# Patient Record
Sex: Female | Born: 1990
Health system: Southern US, Community
[De-identification: ages and names within clinical notes are randomized; demographics above are authoritative.]

## PROBLEM LIST (undated history)

## (undated) DIAGNOSIS — F329 Major depressive disorder, single episode, unspecified: Secondary | ICD-10-CM

## (undated) DIAGNOSIS — G43909 Migraine, unspecified, not intractable, without status migrainosus: Secondary | ICD-10-CM

## (undated) DIAGNOSIS — N819 Female genital prolapse, unspecified: Secondary | ICD-10-CM

## (undated) DIAGNOSIS — N393 Stress incontinence (female) (male): Secondary | ICD-10-CM

## (undated) DIAGNOSIS — G43109 Migraine with aura, not intractable, without status migrainosus: Secondary | ICD-10-CM

## (undated) DIAGNOSIS — O4290 Premature rupture of membranes, unspecified as to length of time between rupture and onset of labor, unspecified weeks of gestation: Secondary | ICD-10-CM

## (undated) DIAGNOSIS — Z3400 Encounter for supervision of normal first pregnancy, unspecified trimester: Secondary | ICD-10-CM

## (undated) DIAGNOSIS — D649 Anemia, unspecified: Secondary | ICD-10-CM

## (undated) DIAGNOSIS — Z973 Presence of spectacles and contact lenses: Secondary | ICD-10-CM

## (undated) DIAGNOSIS — F419 Anxiety disorder, unspecified: Secondary | ICD-10-CM

## (undated) HISTORY — DX: Encounter for supervision of normal first pregnancy, unspecified trimester: Z34.00

## (undated) HISTORY — PX: ABDOMINAL HYSTERECTOMY: SHX81

## (undated) HISTORY — DX: Premature rupture of membranes, unspecified as to length of time between rupture and onset of labor, unspecified weeks of gestation: O42.90

## (undated) HISTORY — PX: NO PAST SURGERIES: SHX2092

---
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
## 2011-02-06 ENCOUNTER — Emergency Department (HOSPITAL_COMMUNITY)
Admission: EM | Admit: 2011-02-06 | Discharge: 2011-02-06 | Disposition: A | Discharge status: Home / Self Care | Payer: BC Managed Care – PPO | Attending: Emergency Medicine | Admitting: Emergency Medicine

## 2011-02-06 DIAGNOSIS — R110 Nausea: Secondary | ICD-10-CM | POA: Insufficient documentation

## 2011-02-06 DIAGNOSIS — H53149 Visual discomfort, unspecified: Secondary | ICD-10-CM | POA: Insufficient documentation

## 2011-02-06 DIAGNOSIS — G43909 Migraine, unspecified, not intractable, without status migrainosus: Secondary | ICD-10-CM | POA: Insufficient documentation

## 2011-11-12 DIAGNOSIS — E559 Vitamin D deficiency, unspecified: Secondary | ICD-10-CM | POA: Insufficient documentation

## 2011-11-12 DIAGNOSIS — D519 Vitamin B12 deficiency anemia, unspecified: Secondary | ICD-10-CM | POA: Insufficient documentation

## 2015-08-20 ENCOUNTER — Encounter: Payer: Self-pay | Admitting: Emergency Medicine

## 2015-08-20 ENCOUNTER — Emergency Department
Admission: EM | Admit: 2015-08-20 | Discharge: 2015-08-20 | Disposition: A | Discharge status: Home / Self Care | Payer: 59 | Source: Home / Self Care | Attending: Family Medicine | Admitting: Family Medicine

## 2015-08-20 DIAGNOSIS — J028 Acute pharyngitis due to other specified organisms: Secondary | ICD-10-CM | POA: Diagnosis not present

## 2015-08-20 DIAGNOSIS — J069 Acute upper respiratory infection, unspecified: Secondary | ICD-10-CM

## 2015-08-20 DIAGNOSIS — B9789 Other viral agents as the cause of diseases classified elsewhere: Secondary | ICD-10-CM

## 2015-08-20 HISTORY — DX: Migraine, unspecified, not intractable, without status migrainosus: G43.909

## 2015-08-20 LAB — POCT RAPID STREP A (OFFICE): Rapid Strep A Screen: NEGATIVE

## 2015-08-20 MED ORDER — BENZONATATE 200 MG PO CAPS: 200.0000 mg | ORAL_CAPSULE | Freq: Every day | ORAL | Status: DC

## 2015-08-20 NOTE — ED Notes (Signed)
Sore throat, ears hurt x 1.5 days, patient says she's a hypochondriac

## 2015-08-20 NOTE — Discharge Instructions (Signed)
Take plain guaifenesin (1200mg extended release tabs such as Mucinex) twice daily, with plenty of water, for cough and congestion.  May add Pseudoephedrine (30mg, one or two every 4 to 6 hours) for sinus congestion.  Get adequate rest.   °May use Afrin nasal spray (or generic oxymetazoline) twice daily for about 5 days and then discontinue.  Also recommend using saline nasal spray several times daily and saline nasal irrigation (AYR is a common brand).  Use Flonase nasal spray each morning after using Afrin nasal spray and saline nasal irrigation. °Try warm salt water gargles for sore throat.  °Stop all antihistamines for now, and other non-prescription cough/cold preparations. °May take Ibuprofen 200mg, 4 tabs every 8 hours with food for chest/sternum discomfort. °  °Follow-up with family doctor if not improving about10 days.  °

## 2015-08-20 NOTE — ED Provider Notes (Signed)
CSN: [id number]     Arrival date & time 08/20/15  0935 History   First MD Initiated Contact with Patient 08/20/15 1021     Chief Complaint  Patient presents with  . Sore Throat      HPI Comments: Patient complains of three day history of typical cold-like symptoms developing over several days,  including mild sore throat, fever/chills, sinus congestion, fatigue, and cough.   The history is provided by the patient and a parent.    Past Medical History  Diagnosis Date  . Migraines    History reviewed. No pertinent past surgical history. Family History  Problem Relation Age of Onset  . Hypertension Father   . Hyperlipidemia Father    Social History  Substance Use Topics  . Smoking status: Never Smoker   . Smokeless tobacco: None  . Alcohol Use: No   OB History    No data available     Review of Systems + sore throat + cough No pleuritic pain No wheezing + nasal congestion + post-nasal drainage No sinus pain/pressure No itchy/red eyes ? earache No hemoptysis No SOB + fever, + chills No nausea No vomiting No abdominal pain No diarrhea No urinary symptoms No skin rash + fatigue + myalgias No headache Used OTC meds without relief  Allergies  Latex  Home Medications   Prior to Admission medications   Medication Sig Start Date End Date Taking? Authorizing Provider  benzonatate (TESSALON) 200 MG capsule Take 1 capsule (200 mg total) by mouth at bedtime. Take as needed for cough 08/20/15   Lattie HawStephen A Beese, MD   Meds Ordered and Administered this Visit  Medications - No data to display  BP 110/76 mmHg  Pulse 102  Temp(Src) 100.6 F (38.1 C) (Oral)  Ht 4\' 10"  (1.473 m)  Wt 111 lb (50.349 kg)  BMI 23.21 kg/m2  SpO2 100%  LMP 08/12/2015 (Exact Date) No data found.   Physical Exam Nursing notes and Vital Signs reviewed. Appearance:  Patient appears stated age, and in no acute distress Eyes:  Pupils are equal, round, and reactive to light and  accomodation.  Extraocular movement is intact.  Conjunctivae are not inflamed  Ears:  Canals normal.  Tympanic membranes normal.  Nose:  Mildly congested turbinates.  No sinus tenderness.   Pharynx:  Minimal erythema Neck:  Supple.  Tender enlarged posterior/lateral nodes are palpated bilaterally  Lungs:  Clear to auscultation.  Breath sounds are equal.  Moving air well. Heart:  Regular rate and rhythm without murmurs, rubs, or gallops.  Abdomen:  Nontender without masses or hepatosplenomegaly.  Bowel sounds are present.  No CVA or flank tenderness.  Extremities:  No edema.  Skin:  No rash present.   ED Course  Procedures none    Labs Reviewed  POCT RAPID STREP A (OFFICE)      MDM   1. Acute pharyngitis due to other specified organisms   2. Viral URI with cough    There is no evidence of bacterial infection today.   Treat symptomatically for now  Prescription written for Benzonatate (Tessalon) to take at bedtime for night-time cough.  Take plain guaifenesin (1200mg  extended release tabs such as Mucinex) twice daily, with plenty of water, for cough and congestion.  May add Pseudoephedrine (30mg , one or two every 4 to 6 hours) for sinus congestion.  Get adequate rest.   May use Afrin nasal spray (or generic oxymetazoline) twice daily for about 5 days and then discontinue.  Also recommend  using saline nasal spray several times daily and saline nasal irrigation (AYR is a common brand).  Use Flonase nasal spray each morning after using Afrin nasal spray and saline nasal irrigation. Try warm salt water gargles for sore throat.  Stop all antihistamines for now, and other non-prescription cough/cold preparations. May take Ibuprofen , 4 tabs every 8 hours with food for chest/sternum discomfort.   Follow-up with family doctor if not improving about10 days.     Lattie Haw, MD 08/20/15 1040

## 2015-08-22 ENCOUNTER — Telehealth: Payer: Self-pay | Admitting: *Deleted

## 2015-08-26 ENCOUNTER — Encounter: Payer: Self-pay | Admitting: Physician Assistant

## 2015-08-26 ENCOUNTER — Ambulatory Visit (INDEPENDENT_AMBULATORY_CARE_PROVIDER_SITE_OTHER): Payer: 59 | Admitting: Physician Assistant

## 2015-08-26 VITALS — BP 124/73 | HR 97 | Temp 98.7°F | Ht <= 58 in | Wt 108.0 lb

## 2015-08-26 DIAGNOSIS — H10023 Other mucopurulent conjunctivitis, bilateral: Secondary | ICD-10-CM

## 2015-08-26 DIAGNOSIS — H6502 Acute serous otitis media, left ear: Secondary | ICD-10-CM

## 2015-08-26 MED ORDER — OFLOXACIN 0.3 % OP SOLN: 1.0000 [drp] | Freq: Four times a day (QID) | OPHTHALMIC | Status: DC

## 2015-08-26 MED ORDER — AMOXICILLIN-POT CLAVULANATE 875-125 MG PO TABS: 1.0000 | ORAL_TABLET | Freq: Two times a day (BID) | ORAL | Status: DC

## 2015-08-26 NOTE — Patient Instructions (Signed)

## 2015-08-26 NOTE — Progress Notes (Signed)
   Subjective:    Patient ID: Jordan [name], female    DOB: [birth year], 25 y.o.   MRN: [id number]  HPI Pt is a 25 yo female who presents to the clinic to establish care.   No PMH.  . Family History  Problem Relation Age of Onset  . Hypertension Father   . Hyperlipidemia Father   . Cancer Maternal Grandmother   . Cancer Maternal Grandfather    .Marland Kitchen. Social History   Social History  . Marital Status: Single    Spouse Name: N/A  . Number of Children: N/A  . Years of Education: N/A   Occupational History  . Not on file.   Social History Main Topics  . Smoking status: Never Smoker   . Smokeless tobacco: Not on file  . Alcohol Use: No  . Drug Use: No  . Sexual Activity: No   Other Topics Concern  . Not on file   Social History Narrative   Pt comes in with 10 days of cough, sinus pressure, new left ear pain, bilateral painful red eyes with green discharge. Seen in Urgent Care for URI and given tessalon pearls and mucinex. She has not gotten any better and her eye irritation/discomfort and discharge started after. No fever, chills, body aches, no SOB or wheezing.    Review of Systems See HPI.     Objective:   Physical Exam  Constitutional: She is oriented to person, place, and time. She appears well-developed and well-nourished.  HENT:  Head: Normocephalic and atraumatic.  Right Ear: External ear normal.  Left Ear: External ear normal.  Mouth/Throat: Oropharynx is clear and moist. No oropharyngeal exudate.  Left TM no visible light reflex. Erythematous. Dull TM.  Tenderness over maxillary sinus, bilateral.  Bilateral nasal turbinates red and swollen.   Eyes:  Bilateral injected conjunctiva. No discharge present on exam today. Pt reported just washed out.   Neck: Normal range of motion. Neck supple.  Cardiovascular: Normal rate, regular rhythm and normal heart sounds.   Pulmonary/Chest: Effort normal and breath sounds normal. She has no wheezes.   Lymphadenopathy:    She has cervical adenopathy.  Neurological: She is alert and oriented to person, place, and time.  Psychiatric: She has a normal mood and affect. Her behavior is normal.          Assessment & Plan:  Bilateral bacterial conjunctivitis/left otitis media- augmentin for 10 days. Ofloxacin for 7 days. HO given. Cool compresses. Stay out of work for 24 hours. Note given for this weekend. Follow up if not improving.

## 2015-12-23 ENCOUNTER — Encounter (INDEPENDENT_AMBULATORY_CARE_PROVIDER_SITE_OTHER): Payer: Self-pay

## 2015-12-23 ENCOUNTER — Ambulatory Visit (INDEPENDENT_AMBULATORY_CARE_PROVIDER_SITE_OTHER): Payer: 59 | Admitting: Obstetrics & Gynecology

## 2015-12-23 ENCOUNTER — Encounter: Payer: Self-pay | Admitting: Obstetrics & Gynecology

## 2015-12-23 VITALS — BP 117/80 | HR 90 | Ht <= 58 in | Wt 108.0 lb

## 2015-12-23 DIAGNOSIS — Z30015 Encounter for initial prescription of vaginal ring hormonal contraceptive: Secondary | ICD-10-CM | POA: Diagnosis not present

## 2015-12-23 DIAGNOSIS — Z124 Encounter for screening for malignant neoplasm of cervix: Secondary | ICD-10-CM

## 2015-12-23 DIAGNOSIS — Z01419 Encounter for gynecological examination (general) (routine) without abnormal findings: Secondary | ICD-10-CM

## 2015-12-23 MED ORDER — ETONOGESTREL-ETHINYL ESTRADIOL 0.12-0.015 MG/24HR VA RING: VAGINAL_RING | VAGINAL | 12 refills | Status: DC

## 2015-12-23 NOTE — Progress Notes (Signed)
Subjective:    Jordan [name] is a 25 y.o. S W P0  female who presents for an annual exam, new patient. She has irregular periods, sometimes skips, cycles sometimes very heavy, anywhere from 3-10 days long. She tried OCPs in the past but KNOWS she won't remember to take them. The patient has never been sexually active. GYN screening history: no prior history of gyn screening tests. The patient wears seatbelts: yes. The patient participates in regular exercise: yes. Has the patient ever been transfused or tattooed?: yes. The patient reports that there is not domestic violence in her life.   Menstrual History: OB History    Gravida Para Term Preterm AB Living   0 0 0 0 0 0   SAB TAB Ectopic Multiple Live Births   0 0 0 0 0      Menarche age: [age] Patient's last menstrual period was 12/13/2015 (exact date).    The following portions of the patient's history were reviewed and updated as appropriate: allergies, current medications, past family history, past medical history, past social history, past surgical history and problem list.  Review of Systems Pertinent items are noted in HPI.   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 No breast/gyn/colon cancer   Objective:    BP 117/80   Pulse 90   Ht 4\' 10"  (1.473 m)   Wt 108 lb (49 kg)   LMP 12/13/2015 (Exact Date) Comment: Irregular periods  BMI 22.57 kg/m   General Appearance:    Alert, cooperative, no distress, appears stated age  Head:    Normocephalic, without obvious abnormality, atraumatic  Eyes:    PERRL, conjunctiva/corneas clear, EOM's intact, fundi    benign, both eyes  Ears:    Normal TM's and external ear canals, both ears  Nose:   Nares normal, septum midline, mucosa normal, no drainage    or sinus tenderness  Throat:   Lips, mucosa, and tongue normal; teeth and gums normal  Neck:   Supple, symmetrical, trachea midline, no adenopathy;    thyroid:  no enlargement/tenderness/nodules; no carotid   bruit or JVD  Back:      Symmetric, no curvature, ROM normal, no CVA tenderness  Lungs:     Clear to auscultation bilaterally, respirations unlabored  Chest Wall:    No tenderness or deformity   Heart:    Regular rate and rhythm, S1 and S2 normal, no murmur, rub   or gallop  Breast Exam:    No tenderness, masses, or nipple abnormality  Abdomen:     Soft, non-tender, bowel sounds active all four quadrants,    no masses, no organomegaly  Genitalia:    Normal female without lesion, discharge or tenderness, pediatric speculum required, NSSA, NT, no palpable adnexal masses, Nuvaring placed     Extremities:   Extremities normal, atraumatic, no cyanosis or edema  Pulses:   2+ and symmetric all extremities  Skin:   Skin color, texture, turgor normal, no rashes or lesions  Lymph nodes:   Cervical, supraclavicular, and axillary nodes normal  Neurologic:   CNII-XII intact, normal strength, sensation and reflexes    throughout   .    Assessment:    Healthy female exam.   Irregular periods   Plan:     Thin prep Pap smear.   Start Nuvaring (She declines ortho evra/IUD) Check tsh, vitamin D

## 2015-12-23 NOTE — Progress Notes (Signed)
Pt declined flu shot. Pt stated she will get it at her job site

## 2015-12-24 ENCOUNTER — Telehealth: Payer: Self-pay | Admitting: *Deleted

## 2015-12-24 DIAGNOSIS — E559 Vitamin D deficiency, unspecified: Secondary | ICD-10-CM

## 2015-12-24 LAB — VITAMIN D 25 HYDROXY (VIT D DEFICIENCY, FRACTURES): VIT D 25 HYDROXY: 13 ng/mL — AB (ref 30–100)

## 2015-12-24 LAB — TSH: TSH: 3.71 m[IU]/L

## 2015-12-24 MED ORDER — VITAMIN D (ERGOCALCIFEROL) 1.25 MG (50000 UNIT) PO CAPS: 50000.0000 [IU] | ORAL_CAPSULE | ORAL | 0 refills | Status: DC

## 2015-12-24 NOTE — Telephone Encounter (Signed)
Pt notified of abnormal Vitamin D levels.  RX for Vitamin D 50K for 8 weeks sent to [facility].  Pt to return in 8 weeks to repeat labwork.  TSH was normal

## 2015-12-25 LAB — CYTOLOGY - PAP

## 2016-10-08 ENCOUNTER — Encounter: Payer: Self-pay | Admitting: Osteopathic Medicine

## 2016-10-08 ENCOUNTER — Ambulatory Visit (INDEPENDENT_AMBULATORY_CARE_PROVIDER_SITE_OTHER): Payer: 59 | Admitting: Osteopathic Medicine

## 2016-10-08 VITALS — BP 112/79 | HR 89 | Ht <= 58 in | Wt 103.5 lb

## 2016-10-08 DIAGNOSIS — Z8669 Personal history of other diseases of the nervous system and sense organs: Secondary | ICD-10-CM | POA: Insufficient documentation

## 2016-10-08 DIAGNOSIS — G43B0 Ophthalmoplegic migraine, not intractable: Secondary | ICD-10-CM | POA: Diagnosis not present

## 2016-10-08 MED ORDER — SUMATRIPTAN SUCCINATE 50 MG PO TABS: 50.0000 mg | ORAL_TABLET | ORAL | 1 refills | Status: DC | PRN

## 2016-10-08 NOTE — Patient Instructions (Signed)
Plan:  Imitrex as needed at the first sign of migraine Neurology referral is ordered - let us know if you don't hear back about an appointment with them within one week If worsening or changing symptoms, please seek medical care asap!

## 2016-10-08 NOTE — Progress Notes (Signed)
HPI: Jordan [name] is a 26 y.o. female  who presents to [facility] today, 10/08/16,  for chief complaint of:  Chief Complaint  Patient presents with  . Referral    Headaches: previously diagnosed with migraines and [facility], was put on propranolol but this wasn't helping and she states she was not really offered much else in the way of therapy. Can recall if previously on abortive treatment such as Imitrex. Describes headaches as starting in frontal area then worsening into left side, occasionally associated with left-sided blindness/severe blurry vision and diminished hearing. Does not have the symptoms in the absence of headache. Can last for a few hours or so and then go away. Happening about twice per month whereas used to be a bit more rare. Occasionally happen with periods, she has been on NuvaRing for about a year   Past medical history, surgical history, social history and family history reviewed.  Patient Active Problem List   Diagnosis Date Noted  . History of atypical migraine 10/08/2016    Current medication list and allergy/intolerance information reviewed.   Current Outpatient Prescriptions on File Prior to Visit  Medication Sig Dispense Refill  . etonogestrel-ethinyl estradiol (NUVARING) 0.12-0.015 MG/24HR vaginal ring Insert vaginally and leave in place for 3 consecutive weeks, then remove for 1 week. 1 each 12   No current facility-administered medications on file prior to visit.    Allergies  Allergen Reactions  . Latex Rash and Other (See Comments)    Not documented in historical       Review of Systems:  Constitutional: No recent illness  HEENT: +headache, +vision change  Cardiac: No  chest pain, No  pressure, No palpitations  Respiratory:  No  shortness of breath. No  Cough  Gastrointestinal: No  abdominal pain, no change on bowel habits  Musculoskeletal: No new  myalgia/arthralgia  Skin: No  Rash  Neurologic: No  weakness, No  Dizziness  Psychiatric: No  concerns with depression, No  concerns with anxiety  Exam:  BP 112/79   Pulse 89   Ht 4\' 10"  (1.473 m)   Wt 103 lb 8 oz (46.9 kg)   SpO2 100%   BMI 21.63 kg/m   Constitutional: VS see above. General Appearance: alert, well-developed, well-nourished, NAD  Eyes: Normal lids and conjunctive, non-icteric sclera  Ears, Nose, Mouth, Throat: MMM, Normal external inspection ears/nares/mouth/lips/gums.  Neck: No masses, trachea midline.   Respiratory: Normal respiratory effort. no wheeze, no rhonchi, no rales  Cardiovascular: S1/S2 normal, no murmur, no rub/gallop auscultated. RRR.   Musculoskeletal: Gait normal. Symmetric and independent movement of all extremities  Neurological: Normal balance/coordination. No tremor. EOMI, PERRLA, no nystagmus, cranial nerves grossly intact, cerebellar reflexes intact, normal strength 5 out of 5 in all 4 extremities.   Skin: warm, dry, intact.   Psychiatric: Normal judgment/insight. Normal mood and affect. Oriented x3.      ASSESSMENT/PLAN: We'll place neurology referral given atypical nature of migraine, has been ongoing for years so no emergent imaging at this point so will defer to neurology but likely will need an MRI sometimes seen in. May also need to consider alternative birth control method. Patient provided with prescription for Imitrex and advised call us if hasn't heard back from neurology, appointment was requested within one to 2 weeks, not emergent but I don't want to ignore these vision symptoms that she is having.  Ophthalmoplegic migraine, not intractable - Plan: Ambulatory referral to Neurology,  SUMAtriptan (IMITREX) 50 MG tablet    Patient Instructions  Plan:  Imitrex as needed at the first sign of migraine Neurology referral is ordered - let us know if you don't hear back about an appointment with them within one week If  worsening or changing symptoms, please seek medical care asap!     Follow-up plan: Return for routine care with PCP when due for annual, or sooner as needed .  Visit summary with medication list and pertinent instructions was printed for patient to review, alert [place] if any changes needed. All questions at time of visit were answered - patient instructed to contact office with any additional concerns. ER/RTC precautions were reviewed with the patient and understanding verbalized.

## 2016-11-10 ENCOUNTER — Ambulatory Visit (INDEPENDENT_AMBULATORY_CARE_PROVIDER_SITE_OTHER): Payer: 59 | Admitting: Neurology

## 2016-11-10 ENCOUNTER — Encounter: Payer: Self-pay | Admitting: Neurology

## 2016-11-10 VITALS — BP 119/83 | HR 91 | Ht 59.0 in | Wt 106.0 lb

## 2016-11-10 DIAGNOSIS — G43B0 Ophthalmoplegic migraine, not intractable: Secondary | ICD-10-CM | POA: Diagnosis not present

## 2016-11-10 MED ORDER — ZONISAMIDE 25 MG PO CAPS: ORAL_CAPSULE | ORAL | 2 refills | Status: DC

## 2016-11-10 NOTE — Progress Notes (Signed)
Reason for visit: Migraine headache  Referring physician: Dr. Elby Beck Jonette Jordan [name] is a 26 y.o. female  History of present illness:  Ms. Ceasar Lund is a 26 year old right-handed white female with a history of migraine headaches since she was a child. As time has gone on, the headaches become more frequent and longer in duration. She is now having 2 or 3 headaches a month, but the headaches may last anywhere from 2 up to 5-6 days. The patient is having greater than 10 headache days a month, the headaches may be quite severe at times and she may have to miss work. The headaches are generally on the left frontotemporal area and are associated with nausea and vomiting, the patient may have some decreased hearing in left ear and may have visual loss in the left eye only. The patient does have photophobia and phonophobia with the headache, she has dizziness and sometimes vertigo, she may have difficulty with cognitive processing and difficulty with slurred speech. She may have some problems with gait instability with the headache. She feels, weak all over" at times. The patient reports that she usually has a headache just before her menstrual cycle, but she may have headaches at other times as well and she is not sure of any other activating factors. She denies a family history of migraine. She is taking Imitrex 50 mg tablets for the headache, this seems to help some but does not completely eliminate the headache. She has had MRI evaluation of the brain in July 2012 that was normal. She is sent to this office for an evaluation. In the past, she has been treated with propranolol without benefit with the headache. She has not been on any other daily prophylactic medications.  Past Medical History:  Diagnosis Date  . Migraines     Past Surgical History:  Procedure Laterality Date  . WISDOM TOOTH EXTRACTION  May 2012    Family History  Problem Relation Age of Onset  . Hypertension Father     . Hyperlipidemia Father   . Cancer Maternal Grandmother   . Cancer Maternal Grandfather     Social history:  reports that she has never smoked. She has never used smokeless tobacco. She reports that she drinks alcohol. She reports that she does not use drugs.  Medications:  Prior to Admission medications   Medication Sig Start Date End Date Taking? Authorizing Provider  CALCIUM PO Take 1 Dose by mouth daily. Gummy form   Yes [provider]  etonogestrel-ethinyl estradiol (NUVARING) 0.12-0.015 MG/24HR vaginal ring Insert vaginally and leave in place for 3 consecutive weeks, then remove for 1 week. 12/23/15  Yes Dove, Leanora Ivanoff, MD  Prenatal Vit-Fe Fumarate-FA (PRENATAL PO) Take 1 Dose by mouth daily.   Yes [provider]  SUMAtriptan (IMITREX) 50 MG tablet Take 1 tablet (50 mg total) by mouth every 2 (two) hours as needed for migraine. Max 3 tablets/24 hours, once per week 10/08/16  Yes Sunnie Nielsen, DO      Allergies  Allergen Reactions  . Latex Rash and Other (See Comments)    Not documented in historical     ROS:  Out of a complete 14 system review of symptoms, the patient complains only of the following symptoms, and all other reviewed systems are negative.  Blurred vision, loss of vision, eye pain Shortness of breath Fatigue Palpitations of the heart Ringing in the ears, spinning sensations Diarrhea, constipation easy bruising, easy bleeding Feeling cold Joint pain  Headache, dizziness Shift work  Blood pressure 119/83, pulse 91, height 4\' 11"  (1.499 m), weight 106 lb (48.1 kg).  Physical Exam  General: The patient is alert and cooperative at the time of the examination.  Eyes: Pupils are equal, round, and reactive to light. Discs are flat bilaterally.  Neck: The neck is supple, no carotid bruits are noted.  Respiratory: The respiratory examination is clear.  Cardiovascular: The cardiovascular examination reveals a regular rate and rhythm, no  obvious murmurs or rubs are noted.  Neuromuscular: Range of movement of the cervical spine is full, no crepitus is noted in the temporomandibular joints.  Skin: Extremities are without significant edema.  Neurologic Exam  Mental status: The patient is alert and oriented x 3 at the time of the examination. The patient has apparent normal recent and remote memory, with an apparently normal attention span and concentration ability.  Cranial nerves: Facial symmetry is present. There is good sensation of the face to pinprick and soft touch bilaterally. The strength of the facial muscles and the muscles to head turning and shoulder shrug are normal bilaterally. Speech is well enunciated, no aphasia or dysarthria is noted. Extraocular movements are full. Visual fields are full. The tongue is midline, and the patient has symmetric elevation of the soft palate. No obvious hearing deficits are noted.  Motor: The motor testing reveals 5 over 5 strength of all 4 extremities. Good symmetric motor tone is noted throughout.  Sensory: Sensory testing is intact to pinprick, soft touch, vibration sensation, and position sense on all 4 extremities. No evidence of extinction is noted.  Coordination: Cerebellar testing reveals good finger-nose-finger and heel-to-shin bilaterally.  Gait and station: Gait is normal. Tandem gait is normal. Romberg is negative. No drift is seen.  Reflexes: Deep tendon reflexes are symmetric and normal bilaterally. Toes are downgoing bilaterally.   Assessment/Plan:  1. Migraine headache  The patient is having 10 or more headache days a month, she is on Imitrex with some benefit, but she will need a daily prophylactic medication for the headache. We will start Zonegran in low dose working up to 75 mg at night. She will follow-up in about 3 months, she will call for any dose adjustments. She is to take Imitrex as soon as he feels the headache is starting.  Marlan Palau. [name] Willis  MD 11/10/2016 12:41 PM  [facility] [address] [place], [place] [id number]  Phone [phone] Fax [phone]

## 2016-11-10 NOTE — Patient Instructions (Signed)
   We will start zonegran for the headache. Call for any dose adjustments.

## 2017-02-15 ENCOUNTER — Ambulatory Visit: Payer: 59 | Admitting: Adult Health

## 2017-02-15 ENCOUNTER — Encounter: Payer: Self-pay | Admitting: Adult Health

## 2017-02-15 VITALS — BP 126/70 | HR 64 | Wt 103.6 lb

## 2017-02-15 DIAGNOSIS — G43119 Migraine with aura, intractable, without status migrainosus: Secondary | ICD-10-CM | POA: Diagnosis not present

## 2017-02-15 MED ORDER — RIZATRIPTAN BENZOATE 10 MG PO TABS: ORAL_TABLET | ORAL | 5 refills | Status: DC

## 2017-02-15 NOTE — Progress Notes (Signed)
I have read the note, and I agree with the clinical assessment and plan.  Jordan [name],Jordan [name]   

## 2017-02-15 NOTE — Progress Notes (Signed)
[name]: Jordan [name] DOB: 16-Dec-1990  REASON FOR VISIT: follow up HISTORY FROM: [name]  HISTORY OF PRESENT ILLNESS: Today 02/15/17 Jordan [name] is a 26 year old female with a history of migraine headaches.  She returns today for follow-up.  She states that she was taking Zonegran but was unable to tolerate Jordan side effects.  She states that she has 3-5 severe migraines a month.  She has what she causes a regular headaches 1-2 times a week.  She reports that her headaches always occur above Jordan left eye.  She does have photophobia and phonophobia as well as nausea.  She states that Imitrex will resolve her headache in about 45 minutes to 1 hour however she has nausea and dizziness after taking Imitrex.  She states that she also has a complete loss of vision in Jordan left eye during these migraines.  She states prior to her headache she will experience some blurry vision and "haziness" in Jordan peripheral vision.  In Jordan past she is tried Zonegran and propranolol without benefit.  She returns today for an evaluation.  HISTORY 11/10/16: Jordan [name] is a 26 year old right-handed white female with a history of migraine headaches since she was a child. As time has gone on, Jordan headaches become more frequent and longer in duration. She is now having 2 or 3 headaches a month, but Jordan headaches may last anywhere from 2 up to 5-6 days. Jordan [name] is having greater than 10 headache days a month, Jordan headaches may be quite severe at times and she may have to miss work. Jordan headaches are generally on Jordan left frontotemporal area and are associated with nausea and vomiting, Jordan [name] may have some decreased hearing in left ear and may have visual loss in Jordan left eye only. Jordan [name] does have photophobia and phonophobia with Jordan headache, she has dizziness and sometimes vertigo, she may have difficulty with cognitive processing and difficulty with slurred speech. She may have some problems with gait  instability with Jordan headache. She feels, weak all over" at times. Jordan [name] reports that she usually has a headache just before her menstrual cycle, but she may have headaches at other times as well and she is not sure of any other activating factors. She denies a family history of migraine. She is taking Imitrex 50 mg tablets for Jordan headache, this seems to help some but does not completely eliminate Jordan headache. She has had MRI evaluation of Jordan brain in July 2012 that was normal. She is sent to this office for an evaluation. In Jordan past, she has been treated with propranolol without benefit with Jordan headache. She has not been on any other daily prophylactic medications.  REVIEW OF SYSTEMS: Out of a complete 14 system review of symptoms, Jordan [name] complains only of Jordan following symptoms, and all other reviewed systems are negative.  Light sensitivity, double vision, loss of vision, eye pain, blurred vision, ringing in ears, palpitations, insomnia, shift work, diarrhea, nausea, abdominal pain, cold intolerance, bruise/bleed easily, anemia, dizziness, headache, joint swelling, muscle cramps, depression, nervous/anxious  ALLERGIES: Allergies  Allergen Reactions  . Latex Rash and Other (See Comments)    Not documented in historical     HOME MEDICATIONS: Outpatient Medications Prior to Visit  Medication Sig Dispense Refill  . CALCIUM PO Take 1 Dose by mouth daily. Gummy form    . etonogestrel-ethinyl estradiol (NUVARING) 0.12-0.015 MG/24HR vaginal ring Insert vaginally and leave in place for 3 consecutive weeks, then remove  for 1 week. 1 each 12  . Prenatal Vit-Fe Fumarate-FA (PRENATAL PO) Take 1 Dose by mouth daily.    . SUMAtriptan (IMITREX) 50 MG tablet Take 1 tablet (50 mg total) by mouth every 2 (two) hours as needed for migraine. Max 3 tablets/24 hours, once per week 20 tablet 1  . zonisamide (ZONEGRAN) 25 MG capsule Take one capsule at night for one week, then take 2 capsules at night  for one week, then take 3 capsules at night ([name] not taking: Reported on 02/15/2017) 90 capsule 2   No facility-administered medications prior to visit.     PAST MEDICAL HISTORY: Past Medical History:  Diagnosis Date  . Migraines     PAST SURGICAL HISTORY: Past Surgical History:  Procedure Laterality Date  . WISDOM TOOTH EXTRACTION  May 2012    FAMILY HISTORY: Family History  Problem Relation Age of Onset  . Hypertension Father   . Hyperlipidemia Father   . Cancer Maternal Grandmother   . Cancer Maternal Grandfather     SOCIAL HISTORY: Social History   Socioeconomic History  . Marital status: Single    Spouse name: Not on file  . Number of children: 0  . Years of education: BSN  . Highest education level: Not on file  Social Needs  . Financial resource strain: Not on file  . Food insecurity - worry: Not on file  . Food insecurity - inability: Not on file  . Transportation needs - medical: Not on file  . Transportation needs - non-medical: Not on file  Occupational History  . 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 Smoking status: Never Smoker  . Smokeless tobacco: Never Used  Substance and Sexual Activity  . Alcohol use: Yes    Comment: Rarely  . Drug use: No  . Sexual activity: No  Other Topics Concern  . Not on file  Social History Narrative   Nurse at [employer]   Lives with roommate   Caffeine use: 3-4 times per week Neita Carp)   Right handed      PHYSICAL EXAM  Vitals:   02/15/17 0843  BP: 126/70  Pulse: 64  Weight: 103 lb 9.6 oz (47 kg)   Body mass index is 20.92 kg/m.  Generalized: Well developed, in no acute distress   Neurological examination  Mentation: Alert oriented to time, place, history taking. Follows all commands speech and language fluent Cranial nerve II-XII: Pupils were equal round reactive to light. Extraocular movements were full, visual field were full on confrontational test. Facial sensation and strength were  normal. Uvula tongue midline. Head turning and shoulder shrug  were normal and symmetric. Motor: Jordan motor testing reveals 5 over 5 strength of all 4 extremities. Good symmetric motor tone is noted throughout.  Sensory: Sensory testing is intact to soft touch on all 4 extremities. No evidence of extinction is noted.  Coordination: Cerebellar testing reveals good finger-nose-finger and heel-to-shin bilaterally.  Gait and station: Gait is normal. Tandem gait is normal. Romberg is negative. No drift is seen.  Reflexes: Deep tendon reflexes are symmetric and normal bilaterally.   DIAGNOSTIC DATA (LABS, IMAGING, TESTING) - I reviewed [name] records, labs, notes, testing and imaging myself where available.    ASSESSMENT AND PLAN 26 y.o. year old female  has a past medical history of Migraines. here with:   1.  Migraine headaches  [name] continues to have approximately 5 severe headaches a month.  I recommended trying a daily preventative medication.  She is concerned about a preventative medication as she may consider pregnancy within Jordan next couple of years.  I recommended nortriptyline 10 mg at bedtime.  I reviewed side effects with Jordan [name].  Jordan [name] states that she will review Jordan medication and let me know if she wants to start this.  We will also try Maxalt instead of Imitrex to see if this works better for her.  She is advised that if her symptoms worsen or she develops new symptoms she should let us know.  She will follow-up in 4 months or sooner if needed.   I spent 15 minutes with Jordan [name]. 50% of this time was spent discussing treatment options for her migraines.     Butch PennyMegan Madisan Bice, MSN, NP-C 02/15/2017, 9:14 AM [address] [facility] [address], [address] [place], [place] [id number] [phone]

## 2017-02-15 NOTE — Patient Instructions (Addendum)
Your Plan:  Try Maxalt instead of Imitrex Consider Nortriptyline  If your symptoms worsen or you develop new symptoms please let us know.   Thank you for coming [facility] at [facility]. I hope we have been able to provide you high quality care today.  You may receive a patient satisfaction survey over the next few weeks. We would appreciate your feedback and comments so that we may continue to improve ourselves and the health of our patients.  Nortriptyline capsules What is this medicine? NORTRIPTYLINE (nor TRIP ti leen) is used to treat depression. This medicine may be used for other purposes; ask your health care provider or pharmacist if you have questions. COMMON BRAND NAME(S): Aventyl, Pamelor What should I tell my health care provider before I take this medicine? They need to know if you have any of these conditions: -an alcohol problem -bipolar disorder or schizophrenia -difficulty passing urine, prostate trouble -glaucoma -heart disease or recent heart attack -liver disease -over active thyroid -seizures -thoughts or plans of suicide or a previous suicide attempt or family history of suicide attempt -an unusual or allergic reaction to nortriptyline, other medicines, foods, dyes, or preservatives -pregnant or trying to get pregnant -breast-feeding How should I use this medicine? Take this medicine by mouth with a glass of water. Follow the directions on the prescription label. Take your doses at regular intervals. Do not take it more often than directed. Do not stop taking this medicine suddenly except upon the advice of your doctor. Stopping this medicine too quickly may cause serious side effects or your condition may worsen. A special MedGuide will be given to you by the pharmacist with each prescription and refill. Be sure to read this information carefully each time. Talk to your pediatrician regarding the use of this medicine in children. Special care  may be needed. Overdosage: If you think you have taken too much of this medicine contact a poison control center or emergency room at once. NOTE: This medicine is only for you. Do not share this medicine with others. What if I miss a dose? If you miss a dose, take it as soon as you can. If it is almost time for your next dose, take only that dose. Do not take double or extra doses. What may interact with this medicine? Do not take this medicine with any of the following medications: -arsenic trioxide -certain medicines medicines for irregular heart beat -cisapride -halofantrine -linezolid -MAOIs like Carbex, Eldepryl, Marplan, Nardil, and Parnate -methylene blue (injected into a vein) -other medicines for mental depression -phenothiazines like perphenazine, thioridazine and chlorpromazine -pimozide -probucol -procarbazine -sparfloxacin -St. John's Wort -ziprasidone This medicine may also interact with any of the following medications: -atropine and related drugs like hyoscyamine, scopolamine, tolterodine and others -barbiturate medicines for inducing sleep or treating seizures, such as phenobarbital -cimetidine -medicines for diabetes -medicines for seizures like carbamazepine or phenytoin -reserpine -thyroid medicine This list may not describe all possible interactions. Give your health care provider a list of all the medicines, herbs, non-prescription drugs, or dietary supplements you use. Also tell them if you smoke, drink alcohol, or use illegal drugs. Some items may interact with your medicine. What should I watch for while using this medicine? Tell your doctor if your symptoms do not get better or if they get worse. Visit your doctor or health care professional for regular checks on your progress. Because it may take several weeks to see the full effects of this medicine, it is  important to continue your treatment as prescribed by your doctor. Patients and their families should  watch out for new or worsening thoughts of suicide or depression. Also watch out for sudden changes in feelings such as feeling anxious, agitated, panicky, irritable, hostile, aggressive, impulsive, severely restless, overly excited and hyperactive, or not being able to sleep. If this happens, especially at the beginning of treatment or after a change in dose, call your health care professional. Bonita QuinYou may get drowsy or dizzy. Do not drive, use machinery, or do anything that needs mental alertness until you know how this medicine affects you. Do not stand or sit up quickly, especially if you are an older patient. This reduces the risk of dizzy or fainting spells. Alcohol may interfere with the effect of this medicine. Avoid alcoholic drinks. Do not treat yourself for coughs, colds, or allergies without asking your doctor or health care professional for advice. Some ingredients can increase possible side effects. Your mouth may get dry. Chewing sugarless gum or sucking hard candy, and drinking plenty of water may help. Contact your doctor if the problem does not go away or is severe. This medicine may cause dry eyes and blurred vision. If you wear contact lenses you may feel some discomfort. Lubricating drops may help. See your eye doctor if the problem does not go away or is severe. This medicine can cause constipation. Try to have a bowel movement at least every 2 to 3 days. If you do not have a bowel movement for 3 days, call your doctor or health care professional. This medicine can make you more sensitive to the sun. Keep out of the sun. If you cannot avoid being in the sun, wear protective clothing and use sunscreen. Do not use sun lamps or tanning beds/booths. What side effects may I notice from receiving this medicine? Side effects that you should report to your doctor or health care professional as soon as possible: -allergic reactions like skin rash, itching or hives, swelling of the face, lips, or  tongue -anxious -breathing problems -changes in vision -confusion -elevated mood, decreased need for sleep, racing thoughts, impulsive behavior -eye pain -fast, irregular heartbeat -feeling faint or lightheaded, falls -feeling agitated, angry, or irritable -fever with increased sweating -hallucination, loss of contact with reality -seizures -stiff muscles -suicidal thoughts or other mood changes -tingling, pain, or numbness in the feet or hands -trouble passing urine or change in the amount of urine -trouble sleeping -unusually weak or tired -vomiting -yellowing of the eyes or skin Side effects that usually do not require medical attention (report to your doctor or health care professional if they continue or are bothersome): -change in sex drive or performance -change in appetite or weight -constipation -dizziness -dry mouth -nausea -tired -tremors -upset stomach This list may not describe all possible side effects. Call your doctor for medical advice about side effects. You may report side effects to FDA at 1-800-FDA-1088. Where should I keep my medicine? Keep out of the reach of children. Store at room temperature between 15 and 30 degrees C (59 and 86 degrees F). Keep container tightly closed. Throw away any unused medicine after the expiration date. NOTE: This sheet is a summary. It may not cover all possible information. If you have questions about this medicine, talk to your doctor, pharmacist, or health care provider.  2018 Elsevier/Gold Standard (2015-08-22 12:53:08)

## 2017-02-16 ENCOUNTER — Encounter: Payer: Self-pay | Admitting: Adult Health

## 2017-02-17 ENCOUNTER — Other Ambulatory Visit: Payer: Self-pay | Admitting: Adult Health

## 2017-02-17 MED ORDER — NORTRIPTYLINE HCL 10 MG PO CAPS: 10.0000 mg | ORAL_CAPSULE | Freq: Every day | ORAL | 3 refills | Status: DC

## 2017-04-05 NOTE — L&D Delivery Note (Signed)
OB[facility]N [facility] Delivery Note  Theone MurdochKaelin M Fredlund is a 27 y.o. G1P1001 s/p SVD at 549w1d. She was admitted for PROM.   ROM: 33h 179m with clear fluid GBS Status: positive Maximum Maternal Temperature: Temp (48hrs), Avg:98.4 F (36.9 C), Min:98 F (36.7 C), Max:98.8 F (37.1 C)  Labor Progress: . Admitted with PROM, minimal cervical change on own . Started on cytotec x 2 with painful contractions . Epidural placement . Pitocin started  Delivery Date/Time: 03/14/18 at 0730 Delivery: Called to room and patient was complete and pushing. Head delivered occiput anterior with compound hand presentation. No nuchal cord present. Shoulder and body delivered in usual fashion. Infant with spontaneous cry, placed on mother's abdomen, dried and stimulated. Cord clamped x 2 after 1-minute delay, and cut by father of baby. Cord blood drawn. Placenta delivered spontaneously with gentle cord traction. Fundus firm with massage and Pitocin. Labia, perineum, vagina, and cervix inspected inspected with complicated, stellate 2nd degree laceration going into left labial laceration and right periurethral laceration.   Placenta: spontaneous, intact, 3-vessel cord Complications: none Lacerations: 2nd degree perineal, stellate with involvement of left labia and right periurethral all repaired with 3-0 Vicryl  EBL: 953cc - mostly related to combination of laceration and uterine tone  methergine IM given x 1 with improvement in uterine tone and bleeding  Analgesia: epidural  Postpartum Planning [x]  message to sent to schedule follow-up  [x]  vaccines UTD  Infant: vigorous female   APGARs 309, [id number]  3200g  Lashuna Tamashiro S. Earlene PlaterWallace, DO OB/GYN Fellow, Faculty Practice

## 2017-05-06 ENCOUNTER — Ambulatory Visit (INDEPENDENT_AMBULATORY_CARE_PROVIDER_SITE_OTHER): Payer: Self-pay | Admitting: Nurse Practitioner

## 2017-05-06 VITALS — BP 104/74 | HR 122 | Temp 98.8°F | Resp 20 | Wt 104.6 lb

## 2017-05-06 DIAGNOSIS — B379 Candidiasis, unspecified: Secondary | ICD-10-CM

## 2017-05-06 DIAGNOSIS — N390 Urinary tract infection, site not specified: Secondary | ICD-10-CM

## 2017-05-06 LAB — POCT URINALYSIS DIPSTICK
Glucose, UA: NEGATIVE
Ketones, UA: 80
NITRITE UA: NEGATIVE
PH UA: 6 (ref 5.0–8.0)
RBC UA: NEGATIVE
Spec Grav, UA: 1.01 (ref 1.010–1.025)
UROBILINOGEN UA: 1 U/dL

## 2017-05-06 LAB — POCT URINE PREGNANCY: Preg Test, Ur: NEGATIVE

## 2017-05-06 MED ORDER — FLUCONAZOLE 150 MG PO TABS: 150.0000 mg | ORAL_TABLET | Freq: Once | ORAL | 0 refills | Status: AC

## 2017-05-06 MED ORDER — NITROFURANTOIN MONOHYD MACRO 100 MG PO CAPS: 100.0000 mg | ORAL_CAPSULE | Freq: Two times a day (BID) | ORAL | 0 refills | Status: AC

## 2017-05-06 NOTE — Addendum Note (Signed)
Addended by: Denton BrickHOLLOWAY, Ercilia Bettinger F on: 05/06/2017 05:43 PM   Modules accepted: Orders

## 2017-05-06 NOTE — Progress Notes (Signed)
Subjective:     Jordan [name] is a 27 y.o. female who presents for evaluation of an abnormal vaginal discharge. Symptoms have been present for 3 days. Vaginal symptoms: discharge described as milky and vaginal erythema noted, local irritation, urinary symptoms of burning, vulvar itching and "discomfort". Contraception: none. She denies abnormal bleeding, blisters, bumps, dyspareunia, odor and urinary symptoms of chills, cloudy urine, fever none, flank pain bilaterally, hematuria, lower abdominal pain, nausea, urinary frequency, urinary hesitancy, urinary incontinence, urinary retention and urinary urgency Sexually transmitted infection risk: very low risk of STD exposure. Menstrual flow: is regular, patient states her period is now 2 days late.  The following portions of the patient's history were reviewed and updated as appropriate: allergies, current medications and past medical history.   Review of Systems Constitutional: negative Eyes: negative Respiratory: negative Cardiovascular: negative Gastrointestinal: negative Genitourinary:positive for vaginal discharge and dysuria, negative for abnormal menstrual periods and genital lesions, hematuria, hesitancy and urinary incontinence Neurological: negative    Objective:    BP 104/74 (BP Location: Right Arm, Patient Position: Sitting, Cuff Size: Normal)   Pulse (!) 122   Temp 98.8 F (37.1 C) (Oral)   Resp 20   Wt 104 lb 9.6 oz (47.4 kg)   SpO2 99%   BMI 21.13 kg/m  General appearance: alert, cooperative and no distress Head: Normocephalic, without obvious abnormality, atraumatic Eyes: conjunctivae/corneas clear. PERRL, EOM's intact. Fundi benign. Ears: normal TM's and external ear canals both ears Nose: Nares normal. Septum midline. Mucosa normal. No drainage or sinus tenderness. Throat: lips, mucosa, and tongue normal; teeth and gums normal Lungs: clear to auscultation bilaterally Heart: regular rate and rhythm, S1, S2 normal,  no murmur, click, rub or gallop Abdomen: soft, non-tender; bowel sounds normal; no masses,  no organomegaly Pulses: 2+ and symmetric Skin: Skin color, texture, turgor normal. No rashes or lesions Lymph nodes: cervical nodes normal    Assessment:    Yeast Infection and Urinary Tract Infection.    Plan:    Symptomatic local care discussed. Transport plannerducational materials distributed. Oral antifungal see orders. Oral antibiotics see orders. Abstinence from intercourse discussed. Partner notification discussed. Discussed safe sex. Follow up with PCP in 7 days. if no improvement.

## 2017-05-06 NOTE — Patient Instructions (Addendum)
Urinary Tract Infection, Adult A urinary tract infection (UTI) is an infection of any part of the urinary tract, which includes the kidneys, ureters, bladder, and urethra. These organs make, store, and get rid of urine in the body. UTI can be a bladder infection (cystitis) or kidney infection (pyelonephritis). What are the causes? This infection may be caused by fungi, viruses, or bacteria. Bacteria are the most common cause of UTIs. This condition can also be caused by repeated incomplete emptying of the bladder during urination. What increases the risk? This condition is more likely to develop if:  You ignore your need to urinate or hold urine for long periods of time.  You do not empty your bladder completely during urination.  You wipe back to front after urinating or having a bowel movement, if you are female.  You are uncircumcised, if you are female.  You are constipated.  You have a urinary catheter that stays in place (indwelling).  You have a weak defense (immune) system.  You have a medical condition that affects your bowels, kidneys, or bladder.  You have diabetes.  You take antibiotic medicines frequently or for long periods of time, and the antibiotics no longer work well against certain types of infections (antibiotic resistance).  You take medicines that irritate your urinary tract.  You are exposed to chemicals that irritate your urinary tract.  You are female.  What are the signs or symptoms? Symptoms of this condition include:  Fever.  Frequent urination or passing small amounts of urine frequently.  Needing to urinate urgently.  Pain or burning with urination.  Urine that smells bad or unusual.  Cloudy urine.  Pain in the lower abdomen or back.  Trouble urinating.  Blood in the urine.  Vomiting or being less hungry than normal.  Diarrhea or abdominal pain.  Vaginal discharge, if you are female.  How is this diagnosed? This condition is  diagnosed with a medical history and physical exam. You will also need to provide a urine sample to test your urine. Other tests may be done, including:  Blood tests.  Sexually transmitted disease (STD) testing.  If you have had more than one UTI, a cystoscopy or imaging studies may be done to determine the cause of the infections. How is this treated? Treatment for this condition often includes a combination of two or more of the following:  Antibiotic medicine.  Other medicines to treat less common causes of UTI.  Over-the-counter medicines to treat pain.  Drinking enough water to stay hydrated.  Follow these instructions at home:  Take over-the-counter and prescription medicines only as told by your health care provider.  If you were prescribed an antibiotic, take it as told by your health care provider. Do not stop taking the antibiotic even if you start to feel better.  Avoid alcohol, caffeine, tea, and carbonated beverages. They can irritate your bladder.  Drink enough fluid to keep your urine clear or pale yellow.  Keep all follow-up visits as told by your health care provider. This is important.  Make sure to: ? Empty your bladder often and completely. Do not hold urine for long periods of time. ? Empty your bladder before and after sex. ? Wipe from front to back after a bowel movement if you are female. Use each tissue one time when you wipe. Contact a health care provider if:  You have back pain.  You have a fever.  You feel nauseous or vomit.  Your symptoms do not   get better after 3 days.  Your symptoms go away and then return. Get help right away if:  You have severe back pain or lower abdominal pain.  You are vomiting and cannot keep down any medicines or water. This information is not intended to replace advice given to you by your health care provider. Make sure you discuss any questions you have with your health care provider. Document Released:  12/30/2004 Document Revised: 09/03/2015 Document Reviewed: 02/10/2015 Elsevier Interactive Patient Education  2018 [employer]  Vaginal Yeast infection, Adult Vaginal yeast infection is a condition that causes soreness, swelling, and redness (inflammation) of the vagina. It also causes vaginal discharge. This is a common condition. Some women get this infection frequently. What are the causes? This condition is caused by a change in the normal balance of the yeast (candida) and bacteria that live in the vagina. This change causes an overgrowth of yeast, which causes the inflammation. What increases the risk? This condition is more likely to develop in:  Women who take antibiotic medicines.  Women who have diabetes.  Women who take birth control pills.  Women who are pregnant.  Women who douche often.  Women who have a weak defense (immune) system.  Women who have been taking steroid medicines for a long time.  Women who frequently wear tight clothing.  What are the signs or symptoms? Symptoms of this condition include:  White, thick vaginal discharge.  Swelling, itching, redness, and irritation of the vagina. The lips of the vagina (vulva) may be affected as well.  Pain or a burning feeling while urinating.  Pain during sex.  How is this diagnosed? This condition is diagnosed with a medical history and physical exam. This will include a pelvic exam. Your health care provider will examine a sample of your vaginal discharge under a microscope. Your health care provider may send this sample for testing to confirm the diagnosis. How is this treated? This condition is treated with medicine. Medicines may be over-the-counter or prescription. You may be told to use one or more of the following:  Medicine that is taken orally.  Medicine that is applied as a cream.  Medicine that is inserted directly into the vagina (suppository).  Follow these instructions at home:  Take  or apply over-the-counter and prescription medicines only as told by your health care provider.  Do not have sex until your health care provider has approved. Tell your sex partner that you have a yeast infection. That person should go to his or her health care provider if he or she develops symptoms.  Do not wear tight clothes, such as pantyhose or tight pants.  Avoid using tampons until your health care provider approves.  Eat more yogurt. This may help to keep your yeast infection from returning.  Try taking a sitz bath to help with discomfort. This is a warm water bath that is taken while you are sitting down. The water should only come up to your hips and should cover your buttocks. Do this 3-4 times per day or as told by your health care provider.  Do not douche.  Wear breathable, cotton underwear.  If you have diabetes, keep your blood sugar levels under control. Contact a health care provider if:  You have a fever.  Your symptoms go away and then return.  Your symptoms do not get better with treatment.  Your symptoms get worse.  You have new symptoms.  You develop blisters in or around your vagina.    You have blood coming from your vagina and it is not your menstrual period.  You develop pain in your abdomen. This information is not intended to replace advice given to you by your health care provider. Make sure you discuss any questions you have with your health care provider. Document Released: 12/30/2004 Document Revised: 09/03/2015 Document Reviewed: 09/23/2014 Elsevier Interactive Patient Education  2018 [employer]  

## 2017-05-08 ENCOUNTER — Telehealth: Payer: Self-pay | Admitting: Nurse Practitioner

## 2017-05-08 NOTE — Telephone Encounter (Signed)
Called patient to check her status.  Reached voicemail, left message asking patient to return the call.

## 2017-05-10 ENCOUNTER — Encounter: Payer: Self-pay | Admitting: Family

## 2017-05-10 ENCOUNTER — Ambulatory Visit (INDEPENDENT_AMBULATORY_CARE_PROVIDER_SITE_OTHER): Payer: Self-pay | Admitting: Family

## 2017-05-10 VITALS — BP 110/78 | HR 146 | Temp 99.3°F | Resp 18 | Wt 100.8 lb

## 2017-05-10 DIAGNOSIS — N10 Acute tubulo-interstitial nephritis: Secondary | ICD-10-CM

## 2017-05-10 MED ORDER — CIPROFLOXACIN HCL 500 MG PO TABS: 500.0000 mg | ORAL_TABLET | Freq: Two times a day (BID) | ORAL | 0 refills | Status: DC

## 2017-05-10 NOTE — Progress Notes (Signed)
   Subjective:    Patient ID: Jordan [name], female    DOB: 1990-08-15, 27 y.o.   MRN: [id number]  Fever   This is a new problem. Associated symptoms include nausea. Pertinent negatives include no vomiting.  Urinary Tract Infection   This is a new problem. The current episode started 1 to 4 weeks ago. The problem occurs every urination. The problem has been gradually worsening. The quality of the pain is described as burning. Associated symptoms include flank pain, hesitancy and nausea. Pertinent negatives include no frequency, hematuria, urgency or vomiting. She has tried antibiotics for the symptoms. The treatment provided mild relief.      Review of Systems  Constitutional: Positive for fever.  Gastrointestinal: Positive for nausea. Negative for vomiting.  Genitourinary: Positive for flank pain and hesitancy. Negative for frequency, hematuria and urgency.  All other systems reviewed and are negative.      Objective:   Physical Exam  Constitutional: She is oriented to person, place, and time. She appears well-developed and well-nourished. No distress.  HENT:  Head: Normocephalic and atraumatic.  Eyes: Pupils are equal, round, and reactive to light.  Neck: Normal range of motion. Neck supple. No thyromegaly present.  Cardiovascular: Normal rate, regular rhythm, normal heart sounds and intact distal pulses.  No murmur heard. Pulmonary/Chest: Effort normal and breath sounds normal. No respiratory distress. She has no wheezes.  Abdominal: Soft. Bowel sounds are normal. She exhibits no distension. There is no tenderness.  Musculoskeletal: Normal range of motion. She exhibits no edema or tenderness.  Mild right CVA tenderness  Neurological: She is alert and oriented to person, place, and time.  Skin: Skin is warm and dry.  Psychiatric: She has a normal mood and affect. Her behavior is normal. Judgment and thought content normal.  Vitals reviewed.     BP 110/78 (BP Location:  Right Arm, Patient Position: Sitting, Cuff Size: Normal)   Pulse (!) 146   Temp 99.3 F (37.4 C) (Oral)   Resp 18   Wt 100 lb 12.8 oz (45.7 kg)   SpO2 98%   BMI 20.36 kg/m      Assessment & Plan:  1. Acute pyelonephritis Close follow up, if symptoms do not improve or worsen need to go to ED.  Force fluids Follow up with PCP - ciprofloxacin (CIPRO) 500 MG tablet; Take 1 tablet (500 mg total) by mouth 2 (two) times daily.  Dispense: 20 tablet; Refill: 0    Jannifer Rodneyhristy Nieves Barberi, FNP

## 2017-05-10 NOTE — Patient Instructions (Signed)
Pyelonephritis, Adult Pyelonephritis is a kidney infection. The kidneys are the organs that filter a person's blood and move waste out of the bloodstream and into the urine. Urine passes from the kidneys, through the ureters, and into the bladder. There are two main types of pyelonephritis:  Infections that come on quickly without any warning (acute pyelonephritis).  Infections that last for a long period of time (chronic pyelonephritis).  In most cases, the infection clears up with treatment and does not cause further problems. More severe infections or chronic infections can sometimes spread to the bloodstream or lead to other problems with the kidneys. What are the causes? This condition is usually caused by:  Bacteria traveling from the bladder to the kidney through infected urine. The urine in the bladder can become infected with bacteria from: ? Bladder infection (cystitis). ? Inflammation of the prostate gland (prostatitis). ? Sexual intercourse, in females.  Bacteria traveling from the bloodstream to the kidney.  What increases the risk? This condition is more likely to develop in:  Pregnant women.  Older people.  People who have diabetes.  People who have kidney stones or bladder stones.  People who have other abnormalities of the kidney or ureter.  People who have a catheter placed in the bladder.  People who have cancer.  People who are sexually active.  Women who use spermicides.  People who have had a prior urinary tract infection.  What are the signs or symptoms? Symptoms of this condition include:  Frequent urination.  Strong or persistent urge to urinate.  Burning or stinging when urinating.  Abdominal pain.  Back pain.  Pain in the side or flank area.  Fever.  Chills.  Blood in the urine, or dark urine.  Nausea.  Vomiting.  How is this diagnosed? This condition may be diagnosed based on:  Medical history and physical exam.  Urine  tests.  Blood tests.  You may also have imaging tests of the kidneys, such as an ultrasound or CT scan. How is this treated? Treatment for this condition may depend on the severity of the infection.  If the infection is mild and is found early, you may be treated with antibiotic medicines taken by mouth. You will need to drink fluids to remain hydrated.  If the infection is more severe, you may need to stay in the hospital and receive antibiotics given directly into a vein through an IV tube. You may also need to receive fluids through an IV tube if you are not able to remain hydrated. After your hospital stay, you may need to take oral antibiotics for a period of time.  Other treatments may be required, depending on the cause of the infection. Follow these instructions at home: Medicines  Take over-the-counter and prescription medicines only as told by your health care provider.  If you were prescribed an antibiotic medicine, take it as told by your health care provider. Do not stop taking the antibiotic even if you start to feel better. General instructions  Drink enough fluid to keep your urine clear or pale yellow.  Avoid caffeine, tea, and carbonated beverages. They tend to irritate the bladder.  Urinate often. Avoid holding in urine for long periods of time.  Urinate before and after sex.  After a bowel movement, women should cleanse from front to back. Use each tissue only once.  Keep all follow-up visits as told by your health care provider. This is important. Contact a health care provider if:  Your symptoms   do not get better after 2 days of treatment.  Your symptoms get worse.  You have a fever. Get help right away if:  You are unable to take your antibiotics or fluids.  You have shaking chills.  You vomit.  You have severe flank or back pain.  You have extreme weakness or fainting. This information is not intended to replace advice given to you by your  health care provider. Make sure you discuss any questions you have with your health care provider. Document Released: 03/22/2005 Document Revised: 08/28/2015 Document Reviewed: 07/15/2014 Elsevier Interactive Patient Education  2018 [employer]  

## 2017-05-11 ENCOUNTER — Other Ambulatory Visit: Payer: Self-pay

## 2017-05-11 ENCOUNTER — Emergency Department (HOSPITAL_BASED_OUTPATIENT_CLINIC_OR_DEPARTMENT_OTHER)
Admission: EM | Admit: 2017-05-11 | Discharge: 2017-05-11 | Disposition: A | Discharge status: Home / Self Care | Payer: 59 | Attending: Physician Assistant | Admitting: Physician Assistant

## 2017-05-11 ENCOUNTER — Encounter (HOSPITAL_BASED_OUTPATIENT_CLINIC_OR_DEPARTMENT_OTHER): Payer: Self-pay | Admitting: *Deleted

## 2017-05-11 DIAGNOSIS — D696 Thrombocytopenia, unspecified: Secondary | ICD-10-CM | POA: Insufficient documentation

## 2017-05-11 DIAGNOSIS — R109 Unspecified abdominal pain: Secondary | ICD-10-CM | POA: Diagnosis not present

## 2017-05-11 DIAGNOSIS — R5081 Fever presenting with conditions classified elsewhere: Secondary | ICD-10-CM | POA: Diagnosis not present

## 2017-05-11 DIAGNOSIS — R509 Fever, unspecified: Secondary | ICD-10-CM | POA: Diagnosis not present

## 2017-05-11 DIAGNOSIS — D709 Neutropenia, unspecified: Secondary | ICD-10-CM | POA: Insufficient documentation

## 2017-05-11 DIAGNOSIS — Z91040 Latex allergy status: Secondary | ICD-10-CM | POA: Insufficient documentation

## 2017-05-11 LAB — CBC WITH DIFFERENTIAL/PLATELET
BASOS ABS: 0 10*3/uL (ref 0.0–0.1)
Basophils Relative: 0 %
EOS PCT: 1 %
Eosinophils Absolute: 0 10*3/uL (ref 0.0–0.7)
HEMATOCRIT: 40.2 % (ref 36.0–46.0)
HEMOGLOBIN: 14.4 g/dL (ref 12.0–15.0)
LYMPHS PCT: 52 %
Lymphs Abs: 1.9 10*3/uL (ref 0.7–4.0)
MCH: 32 pg (ref 26.0–34.0)
MCHC: 35.8 g/dL (ref 30.0–36.0)
MCV: 89.3 fL (ref 78.0–100.0)
MONOS PCT: 17 %
Monocytes Absolute: 0.6 10*3/uL (ref 0.1–1.0)
NEUTROS ABS: 1.1 10*3/uL — AB (ref 1.7–7.7)
Neutrophils Relative %: 30 %
Platelets: 92 10*3/uL — ABNORMAL LOW (ref 150–400)
RBC: 4.5 MIL/uL (ref 3.87–5.11)
RDW: 12.3 % (ref 11.5–15.5)
WBC: 3.6 10*3/uL — ABNORMAL LOW (ref 4.0–10.5)

## 2017-05-11 LAB — URINALYSIS, ROUTINE W REFLEX MICROSCOPIC
BILIRUBIN URINE: NEGATIVE
Glucose, UA: NEGATIVE mg/dL
Ketones, ur: 15 mg/dL — AB
Leukocytes, UA: NEGATIVE
Nitrite: NEGATIVE
PH: 6.5 (ref 5.0–8.0)
Protein, ur: NEGATIVE mg/dL
SPECIFIC GRAVITY, URINE: 1.02 (ref 1.005–1.030)

## 2017-05-11 LAB — URINALYSIS, MICROSCOPIC (REFLEX): WBC UA: NONE SEEN WBC/hpf (ref 0–5)

## 2017-05-11 LAB — BASIC METABOLIC PANEL
ANION GAP: 9 (ref 5–15)
BUN: 11 mg/dL (ref 6–20)
CHLORIDE: 102 mmol/L (ref 101–111)
CO2: 26 mmol/L (ref 22–32)
Calcium: 8.7 mg/dL — ABNORMAL LOW (ref 8.9–10.3)
Creatinine, Ser: 0.76 mg/dL (ref 0.44–1.00)
GFR calc Af Amer: >60 mL/min (ref >60)
GFR calc non Af Amer: >60 mL/min (ref >60)
GLUCOSE: 97 mg/dL (ref 65–99)
Potassium: 4 mmol/L (ref 3.5–5.1)
Sodium: 137 mmol/L (ref 135–145)

## 2017-05-11 LAB — I-STAT CG4 LACTIC ACID, ED: Lactic Acid, Venous: 0.84 mmol/L (ref 0.5–1.9)

## 2017-05-11 LAB — PREGNANCY, URINE: PREG TEST UR: NEGATIVE

## 2017-05-11 MED ORDER — SODIUM CHLORIDE 0.9 % IV BOLUS (SEPSIS): 1000.0000 mL | Freq: Once | INTRAVENOUS | Status: DC

## 2017-05-11 MED ORDER — SODIUM CHLORIDE 0.9 % IV BOLUS (SEPSIS)
1000.0000 mL | Freq: Once | INTRAVENOUS | Status: AC
  Administered 2017-05-11: 1000 mL via INTRAVENOUS

## 2017-05-11 NOTE — ED Triage Notes (Signed)
Pt c/o lower back pain x 1 week , DX pyleo. Pt states does not feel better

## 2017-05-11 NOTE — Discharge Instructions (Signed)
Please read and follow all provided instructions.  Your diagnoses today include:  1. Flank pain   2. Fever, unspecified fever cause   3. Neutropenia, unspecified type (HCC)   4. Thrombocytopenia (HCC)     Tests performed today include:  Urine test - no definite sign of urine infection, culture pending  Blood counts and electrolytes - low white blood cells and platelets, maybe due to viral infection but this will need to be rechecked  Lactic acid - no signs of severe infection  Vital signs. See below for your results today.   Medications prescribed:   None  Take any prescribed medications only as directed.  Home care instructions:  Follow any educational materials contained in this packet.  Use over-the-counter medications as needed.  Call your doctor tomorrow to get a follow-up appointment for recheck as its not clear tonight what is causing your symptoms.  Also you need your blood counts rechecked.  Follow-up instructions: Please follow-up with your primary care provider in the next 2 days for further evaluation of your symptoms.   Return instructions:   Please return to the Emergency Department if you experience worsening symptoms.  Return with persistent vomiting, high persistent fever.  Return if you develop worsening lower abdominal pain.  Please return if you have any other emergent concerns.  Additional Information:  Your vital signs today were: BP 112/79    Pulse 92    Temp 98.8 F (37.1 C) (Oral)    Resp 16    Ht 4\' 10"  (1.473 m)    Wt 45.4 kg (100 lb)    LMP 05/11/2017    SpO2 100%    BMI 20.90 kg/m  If your blood pressure (BP) was elevated above 135/85 this visit, please have this repeated by your doctor within one month. --------------

## 2017-05-11 NOTE — ED Provider Notes (Signed)
[facility] EMERGENCY DEPARTMENT Provider Note   CSN: [id number] Arrival date & time: 05/11/17  1700     History   Chief Complaint Chief Complaint  Patient presents with  . Back Pain    HPI Jordan [name] is a 27 y.o. female.  Patient presents the emergency department with complaint of right-sided flank pain, urinary hesitancy and dysuria, fever and fatigue, and racing heart feeling.  Patient was seen in her primary care office 5 days ago with vaginal discharge.  She was diagnosed with a yeast infection and urinary tract infection and was started on Macrobid.  She has been taking this.  She is saw her PCP yesterday for recheck and was switched to Cipro which she has not started taking.  Symptoms worsened today fever started.  She presents for further evaluation.  No URI symptoms, chest pain, abdominal pain.  No skin rash.  No other treatments prior to arrival. The onset of this condition was acute. The course is worsening. Aggravating factors: none. Alleviating factors: none.        Past Medical History:  Diagnosis Date  . Migraines     Patient Active Problem List   Diagnosis Date Noted  . Ophthalmoplegic migraine, not intractable 10/08/2016    Past Surgical History:  Procedure Laterality Date  . WISDOM TOOTH EXTRACTION  May 2012    OB History    Gravida Para Term Preterm AB Living   0 0 0 0 0 0   SAB TAB Ectopic Multiple Live Births   0 0 0 0 0       Home Medications    Prior to Admission medications   Medication Sig Start Date End Date Taking? Authorizing Provider  CALCIUM PO Take 1 Dose by mouth daily. Gummy form    [provider]  etonogestrel-ethinyl estradiol (NUVARING) 0.12-0.015 MG/24HR vaginal ring Insert vaginally and leave in place for 3 consecutive weeks, then remove for 1 week. Patient not taking: Reported on 05/06/2017 12/23/15   Allie Bossier, MD  nitrofurantoin, macrocrystal-monohydrate, (MACROBID) 100 MG capsule Take 1  capsule (100 mg total) by mouth 2 (two) times daily for 5 days. 05/06/17 05/11/17  Benay Pike, NP  nortriptyline (PAMELOR) 10 MG capsule Take 1 capsule (10 mg total) at bedtime by mouth. 02/17/17   Butch Penny, NP  Prenatal Vit-Fe Fumarate-FA (PRENATAL PO) Take 1 Dose by mouth daily.    [provider]  rizatriptan (MAXALT) 10 MG tablet Take 1 tablet at the onset of migraine.May repeat in 2 hours if needed 02/15/17   Butch Penny, NP    Family History Family History  Problem Relation Age of Onset  . Hypertension Father   . Hyperlipidemia Father   . Cancer Maternal Grandmother   . Cancer Maternal Grandfather     Social History Social History   Tobacco Use  . Smoking status: Never Smoker  . Smokeless tobacco: Never Used  Substance Use Topics  . Alcohol use: Yes    Comment: Rarely  . Drug use: No     Allergies   Latex   Review of Systems Review of Systems  Constitutional: Negative for fever.  HENT: Negative for rhinorrhea and sore throat.   Eyes: Negative for redness.  Respiratory: Negative for cough.   Cardiovascular: Negative for chest pain.  Gastrointestinal: Positive for nausea. Negative for abdominal pain, diarrhea and vomiting.  Genitourinary: Positive for dysuria and vaginal bleeding (on period). Negative for frequency, hematuria, pelvic pain and vaginal discharge (resolved).  Musculoskeletal: Negative for myalgias.  Skin: Negative for rash.  Neurological: Negative for headaches.     Physical Exam Updated Vital Signs BP (!) 127/92   Pulse (!) 116   Temp 99.2 F (37.3 C) (Oral)   Resp 20   Ht 4\' 10"  (1.473 m)   Wt 45.4 kg (100 lb)   LMP 05/11/2017   SpO2 97%   BMI 20.90 kg/m   Physical Exam  Constitutional: She appears well-developed and well-nourished.  HENT:  Head: Normocephalic and atraumatic.  Mouth/Throat: Oropharynx is clear and moist.  Eyes: Conjunctivae are normal. Right eye exhibits no discharge. Left eye exhibits  no discharge.  Neck: Normal range of motion. Neck supple.  Cardiovascular: Regular rhythm and normal heart sounds. Tachycardia present.  Pulmonary/Chest: Effort normal and breath sounds normal. No stridor. No respiratory distress.  Abdominal: Soft. There is no tenderness. There is CVA tenderness (R-sided). There is no rebound and no guarding.  Neurological: She is alert.  Skin: Skin is warm and dry.  Psychiatric: She has a normal mood and affect.  Nursing note and vitals reviewed.    ED Treatments / Results  Labs (all labs ordered are listed, but only abnormal results are displayed) Labs Reviewed  URINALYSIS, ROUTINE W REFLEX MICROSCOPIC - Abnormal; Notable for the following components:      Result Value   APPearance CLOUDY (*)    Hgb urine dipstick SMALL (*)    Ketones, ur 15 (*)    All other components within normal limits  CBC WITH DIFFERENTIAL/PLATELET - Abnormal; Notable for the following components:   WBC 3.6 (*)    Platelets 92 (*)    Neutro Abs 1.1 (*)    All other components within normal limits  BASIC METABOLIC PANEL - Abnormal; Notable for the following components:   Calcium 8.7 (*)    All other components within normal limits  URINALYSIS, MICROSCOPIC (REFLEX) - Abnormal; Notable for the following components:   Bacteria, UA FEW (*)    Squamous Epithelial / LPF 0-5 (*)    All other components within normal limits  URINE CULTURE  PREGNANCY, URINE  I-STAT CG4 LACTIC ACID, ED    Procedures Procedures (including critical care time)  Medications Ordered in ED Medications  sodium chloride 0.9 % bolus 1,000 mL (0 mLs Intravenous Stopped 05/11/17 1945)     Initial Impression / Assessment and Plan / ED Course  I have reviewed the triage vital signs and the nursing notes.  Pertinent labs & imaging results that were available during my care of the patient were reviewed by me and considered in my medical decision making (see chart for details).     Patient seen and  examined. Work-up initiated. Medications ordered. Fluids ordered. H&P suggestive of pyelo however UA is underwhelming. Awaiting labs. Will re-eval.   Vital signs reviewed and are as follows: BP (!) 127/92   Pulse (!) 116   Temp 99.2 F (37.3 C) (Oral)   Resp 20   Ht 4\' 10"  (1.473 m)   Wt 45.4 kg (100 lb)   LMP 05/11/2017   SpO2 97%   BMI 20.90 kg/m   8:29 PM Reviewed results and case with Dr. Corlis Leak.   Patient rechecked.  She is feeling better.  Heart rate improved with fluids.  Normal temperature.  We discussed lab results, including urine test which is not really suggestive of UTI.  Discussed low white blood cell and platelet counts.  On reexam, patient does not have any focal tenderness on  the abdomen.  We discussed performing a pelvic exam to evaluate for localized pelvic tenderness.  She did not have a pelvic exam performed by her primary care doctor on 2/1.  We discussed possible etiologies of fever and flank pain including tubo-ovarian abscess or uterine infection.  Patient defers pelvic exam and would like to follow-up with her primary care physician for this.  We discussed that she would should return to the emergency department if she develops more bothersome abdominal pain, has persistent vomiting, or high persistent fevers, or feels worse. Patient verbalizes understanding and agrees with plan.   She will call her doctor tomorrow to discuss whether she needs to start taking the Cipro in light of workup tonight.  Also to schedule a follow-up appointment for recheck of her blood counts.  BP 112/79   Pulse 92   Temp 98.8 F (37.1 C) (Oral)   Resp 16   Ht 4\' 10"  (1.473 m)   Wt 45.4 kg (100 lb)   LMP 05/11/2017   SpO2 100%   BMI 20.90 kg/m    Final Clinical Impressions(s) / ED Diagnoses   Final diagnoses:  Flank pain  Fever, unspecified fever cause  Neutropenia, unspecified type (HCC)  Thrombocytopenia (HCC)   Patient with flank pain, fever, some urinary symptoms  initially thought due to pyelonephritis and UTI.  Urine test tonight does not suggest infection in the urine.  CBC with neutropenia with ANC greater than 1000, thrombocytopenia without bleeding symptoms likely due to viral infection, however she will need to have this rechecked.  She does not have any URI or flulike symptoms.  Overall constellation of symptoms not suggestive of mono.  She does appear well and nontoxic.  Vital signs improved with treatment here.  She does not have any focal lower abdominal pain.  Mild right-sided CVA tenderness but symptoms are not severe as would be expected with a ureteral stone.  She has no lower abdominal tenderness or tenderness over McBurney's point.  Feel that she is safe for discharge home at this time with close PCP follow-up.  She does seem to be reliable to return if symptoms do worsen.  ED Discharge Orders    None       Renne CriglerGeiple, Maeleigh Buschman, Cordelia Poche-C 05/11/17 2034    Abelino DerrickMackuen, Courteney Lyn, MD 05/12/17 1436

## 2017-05-13 ENCOUNTER — Ambulatory Visit (INDEPENDENT_AMBULATORY_CARE_PROVIDER_SITE_OTHER): Payer: 59 | Admitting: Physician Assistant

## 2017-05-13 ENCOUNTER — Ambulatory Visit (INDEPENDENT_AMBULATORY_CARE_PROVIDER_SITE_OTHER): Payer: 59

## 2017-05-13 ENCOUNTER — Encounter: Payer: Self-pay | Admitting: Physician Assistant

## 2017-05-13 VITALS — BP 123/63 | HR 120 | Ht <= 58 in | Wt 101.0 lb

## 2017-05-13 DIAGNOSIS — M549 Dorsalgia, unspecified: Secondary | ICD-10-CM

## 2017-05-13 DIAGNOSIS — R300 Dysuria: Secondary | ICD-10-CM | POA: Diagnosis not present

## 2017-05-13 DIAGNOSIS — Z87440 Personal history of urinary (tract) infections: Secondary | ICD-10-CM

## 2017-05-13 DIAGNOSIS — R109 Unspecified abdominal pain: Secondary | ICD-10-CM

## 2017-05-13 DIAGNOSIS — R000 Tachycardia, unspecified: Secondary | ICD-10-CM

## 2017-05-13 LAB — URINE CULTURE

## 2017-05-13 NOTE — Progress Notes (Signed)
   Subjective:    Patient ID: Jordan [name], female    DOB: [birth year], 27 y.o.   MRN: [id number]  HPI  Pt is a 27 yo female with recent UTI symptoms, right flank pain that are unresolved. She original went to UC on 2/1 and was treated for UTI and macrobid but symptoms did not improve. She returned on 05/10/17 where she was given cipro and told she had pyleonephritis. She never started cipro before she went to ED on 2/6. They did not suspect pyelonephritis and told her to follow up with PCP. No imaging was done. She has never had kidney stones. No bacteria found on culture. HR was tachycardic but resolved with fluids. She is sexual active with no vaginal discharge or concerns. No new medications. Pain of concern is right sided flank pain 3/10. No abdominal pain. Movement does make worse. No nausea or vomiting. No stool changes. No fever, chills, body aches. Currently on menstrual cycle.   .. Active Ambulatory Problems    Diagnosis Date Noted  . Ophthalmoplegic migraine, not intractable 10/08/2016   Resolved Ambulatory Problems    Diagnosis Date Noted  . History of atypical migraine 10/08/2016   Past Medical History:  Diagnosis Date  . Migraines      Review of Systems    see HPI.  Objective:   Physical Exam  Constitutional: She is oriented to person, place, and time. She appears well-developed and well-nourished.  HENT:  Head: Normocephalic and atraumatic.  Neck: Normal range of motion. Neck supple.  Cardiovascular: Normal rate, regular rhythm and normal heart sounds.  Pulmonary/Chest: Effort normal and breath sounds normal.  Right CVA tenderness.   Abdominal: Soft. Bowel sounds are normal. She exhibits no distension and no mass. There is no tenderness. There is no rebound and no guarding.  Lymphadenopathy:    She has no cervical adenopathy.  Neurological: She is alert and oriented to person, place, and time.  Psychiatric: She has a normal mood and affect. Her behavior is  normal.          Assessment & Plan:  Marland Kitchen.Marland Kitchen.Diagnoses and all orders for this visit:  CVA tenderness -     US RENAL -     POCT urinalysis dipstick -     C. trachomatis/N. gonorrhoeae RNA -     Urine Culture  Tachycardia -     EKG 12-Lead  Right flank pain   EKG- sinus tachycardia at 114. Regular sinus rhythm. Good pwaves. No ST depression or elevation.   Push fluids to keep HR down.   Unclear etiology. Renal u/s ordered today which was normal. Reassured not pyelonephritis. Will check for STD's. No abdominal pain or exam. Could be muscular but no known cause. Ice area. NSAIDs. Will continue to moniter. Do not need to start cipro.   Marland Kitchen..Spent 30 minutes with patient and greater than 50 percent of visit spent counseling patient regarding treatment plan.

## 2017-05-13 NOTE — Patient Instructions (Signed)
Will culture urine.  Will test for GC/chlamydia. Will get renal u/s.

## 2017-05-14 LAB — C. TRACHOMATIS/N. GONORRHOEAE RNA
C. trachomatis RNA, TMA: NOT DETECTED
N. gonorrhoeae RNA, TMA: NOT DETECTED

## 2017-05-23 ENCOUNTER — Encounter: Payer: Self-pay | Admitting: Obstetrics & Gynecology

## 2017-05-23 ENCOUNTER — Ambulatory Visit (INDEPENDENT_AMBULATORY_CARE_PROVIDER_SITE_OTHER): Payer: 59 | Admitting: Obstetrics & Gynecology

## 2017-05-23 VITALS — BP 122/85 | HR 116 | Resp 16 | Ht <= 58 in | Wt 104.0 lb

## 2017-05-23 DIAGNOSIS — Z113 Encounter for screening for infections with a predominantly sexual mode of transmission: Secondary | ICD-10-CM | POA: Diagnosis not present

## 2017-05-23 DIAGNOSIS — Z01419 Encounter for gynecological examination (general) (routine) without abnormal findings: Secondary | ICD-10-CM

## 2017-05-23 DIAGNOSIS — Z124 Encounter for screening for malignant neoplasm of cervix: Secondary | ICD-10-CM | POA: Diagnosis not present

## 2017-05-23 NOTE — Progress Notes (Signed)
Subjective:    Jordan [name] is a 27 y.o. single G0 female who presents for an annual exam. The patient has no complaints today. The patient is sexually active. GYN screening history: last pap: was normal. The patient wears seatbelts: yes. The patient participates in regular exercise: yes. Has the patient ever been transfused or tattooed?: yes. The patient reports that there is not domestic violence in her life.   Menstrual History: OB History    Gravida Para Term Preterm AB Living   0 0 0 0 0 0   SAB TAB Ectopic Multiple Live Births   0 0 0 0 0      Menarche age: [age] Patient's last menstrual period was 05/07/2017.    The following portions of the patient's history were reviewed and updated as appropriate: allergies, current medications, past family history, past medical history, past social history, past surgical history and problem list.  Review of Systems Pertinent items are noted in HPI.   Works as a Charity fundraiserN at [employer] Had flu vaccine FH- no breast/gyn/colon cancer +melanoma in her Maternal and paternal GM and GF Monogamous for a year (in love, might get married Not using contraception for 2 months, on PNV, "not opposed" to a pregnancy    Objective:    BP 122/85   Pulse (!) 116   Resp 16   Ht 4\' 10"  (1.473 m)   Wt 104 lb (47.2 kg)   LMP 05/07/2017   BMI 21.74 kg/m   General Appearance:    Alert, cooperative, no distress, appears stated age  Head:    Normocephalic, without obvious abnormality, atraumatic  Eyes:    PERRL, conjunctiva/corneas clear, EOM's intact, fundi    benign, both eyes  Ears:    Normal TM's and external ear canals, both ears  Nose:   Nares normal, septum midline, mucosa normal, no drainage    or sinus tenderness  Throat:   Lips, mucosa, and tongue normal; teeth and gums normal  Neck:   Supple, symmetrical, trachea midline, no adenopathy;    thyroid:  no enlargement/tenderness/nodules; no carotid   bruit or JVD  Back:     Symmetric, no curvature, ROM  normal, no CVA tenderness  Lungs:     Clear to auscultation bilaterally, respirations unlabored  Chest Wall:    No tenderness or deformity   Heart:    Regular rate and rhythm, S1 and S2 normal, no murmur, rub   or gallop  Breast Exam:    No tenderness, masses, or nipple abnormality  Abdomen:     Soft, non-tender, bowel sounds active all four quadrants,    no masses, no organomegaly  Genitalia:    Normal female without lesion, discharge or tenderness, normal size and shape, anteverted, mobile, non-tender, normal adnexal exam I fitted her with a 6.5 cm diaphragm (tester). This fit her well.     Extremities:   Extremities normal, atraumatic, no cyanosis or edema  Pulses:   2+ and symmetric all extremities  Skin:   Skin color, texture, turgor normal, no rashes or lesions  Lymph nodes:   Cervical, supraclavicular, and axillary nodes normal  Neurologic:   CNII-XII intact, normal strength, sensation and reflexes    throughout  .    Assessment:    Healthy female exam.    Plan:     Thin prep Pap smear.   I gave her a prescription for a diaphragm. She understands how to use it.

## 2017-05-24 LAB — CYTOLOGY - PAP
Chlamydia: NEGATIVE
Diagnosis: NEGATIVE
Neisseria Gonorrhea: NEGATIVE

## 2017-06-15 ENCOUNTER — Ambulatory Visit: Payer: 59 | Admitting: Adult Health

## 2017-06-15 ENCOUNTER — Encounter: Payer: Self-pay | Admitting: Adult Health

## 2017-06-15 VITALS — BP 109/71 | HR 92 | Ht <= 58 in | Wt 104.8 lb

## 2017-06-15 DIAGNOSIS — G43119 Migraine with aura, intractable, without status migrainosus: Secondary | ICD-10-CM | POA: Diagnosis not present

## 2017-06-15 NOTE — Patient Instructions (Addendum)
Your Plan:  Continue Nortriptyline  Can consider gabapentin in the future If your symptoms worsen or you develop new symptoms please let us know.   Thank you for coming to see [place] at [facility]. I hope we have been able to provide you high quality care today.  You may receive a patient satisfaction survey over the next few weeks. We would appreciate your feedback and comments so that we may continue to improve ourselves and the health of our patients.   Gabapentin capsules or tablets What is this medicine? GABAPENTIN (GA ba pen tin) is used to control partial seizures in adults with epilepsy. It is also used to treat certain types of nerve pain. This medicine may be used for other purposes; ask your health care provider or pharmacist if you have questions. COMMON BRAND NAME(S): Active-PAC with Gabapentin, Gabarone, Neurontin What should I tell my health care provider before I take this medicine? They need to know if you have any of these conditions: -kidney disease -suicidal thoughts, plans, or attempt; a previous suicide attempt by you or a family member -an unusual or allergic reaction to gabapentin, other medicines, foods, dyes, or preservatives -pregnant or trying to get pregnant -breast-feeding How should I use this medicine? Take this medicine by mouth with a glass of water. Follow the directions on the prescription label. You can take it with or without food. If it upsets your stomach, take it with food.Take your medicine at regular intervals. Do not take it more often than directed. Do not stop taking except on your doctor's advice. If you are directed to break the 600 or 800 mg tablets in half as part of your dose, the extra half tablet should be used for the next dose. If you have not used the extra half tablet within 28 days, it should be thrown away. A special MedGuide will be given to you by the pharmacist with each prescription and refill. Be sure to read  this information carefully each time. Talk to your pediatrician regarding the use of this medicine in children. Special care may be needed. Overdosage: If you think you have taken too much of this medicine contact a poison control center or emergency room at once. NOTE: This medicine is only for you. Do not share this medicine with others. What if I miss a dose? If you miss a dose, take it as soon as you can. If it is almost time for your next dose, take only that dose. Do not take double or extra doses. What may interact with this medicine? Do not take this medicine with any of the following medications: -other gabapentin products This medicine may also interact with the following medications: -alcohol -antacids -antihistamines for allergy, cough and cold -certain medicines for anxiety or sleep -certain medicines for depression or psychotic disturbances -homatropine; hydrocodone -naproxen -narcotic medicines (opiates) for pain -phenothiazines like chlorpromazine, mesoridazine, prochlorperazine, thioridazine This list may not describe all possible interactions. Give your health care provider a list of all the medicines, herbs, non-prescription drugs, or dietary supplements you use. Also tell them if you smoke, drink alcohol, or use illegal drugs. Some items may interact with your medicine. What should I watch for while using this medicine? Visit your doctor or health care professional for regular checks on your progress. You may want to keep a record at home of how you feel your condition is responding to treatment. You may want to share this information with your doctor or health care professional  at each visit. You should contact your doctor or health care professional if your seizures get worse or if you have any new types of seizures. Do not stop taking this medicine or any of your seizure medicines unless instructed by your doctor or health care professional. Stopping your medicine suddenly  can increase your seizures or their severity. Wear a medical identification bracelet or chain if you are taking this medicine for seizures, and carry a card that lists all your medications. You may get drowsy, dizzy, or have blurred vision. Do not drive, use machinery, or do anything that needs mental alertness until you know how this medicine affects you. To reduce dizzy or fainting spells, do not sit or stand up quickly, especially if you are an older patient. Alcohol can increase drowsiness and dizziness. Avoid alcoholic drinks. Your mouth may get dry. Chewing sugarless gum or sucking hard candy, and drinking plenty of water will help. The use of this medicine may increase the chance of suicidal thoughts or actions. Pay special attention to how you are responding while on this medicine. Any worsening of mood, or thoughts of suicide or dying should be reported to your health care professional right away. Women who become pregnant while using this medicine may enroll in the [place] Antiepileptic Drug Pregnancy Registry by calling [phone])[phone]. This registry collects information about the safety of antiepileptic drug use during pregnancy. What side effects may I notice from receiving this medicine? Side effects that you should report to your doctor or health care professional as soon as possible: -allergic reactions like skin rash, itching or hives, swelling of the face, lips, or tongue -worsening of mood, thoughts or actions of suicide or dying Side effects that usually do not require medical attention (report to your doctor or health care professional if they continue or are bothersome): -constipation -difficulty walking or controlling muscle movements -dizziness -nausea -slurred speech -tiredness -tremors -weight gain This list may not describe all possible side effects. Call your doctor for medical advice about side effects. You may report side effects to FDA at 1-800-FDA-1088. Where  should I keep my medicine? Keep out of reach of children. This medicine may cause accidental overdose and death if it taken by other adults, children, or pets. Mix any unused medicine with a substance like cat litter or coffee grounds. Then throw the medicine away in a sealed container like a sealed bag or a coffee can with a lid. Do not use the medicine after the expiration date. Store at room temperature between 15 and 30 degrees C (59 and 86 degrees F). NOTE: This sheet is a summary. It may not cover all possible information. If you have questions about this medicine, talk to your doctor, pharmacist, or health care provider.  2018 Elsevier/Gold Standard (2013-05-18 15:26:50)

## 2017-06-15 NOTE — Progress Notes (Signed)
PATIENT: Jordan [name] DOB: 06/27/1990  REASON FOR VISIT: follow up HISTORY FROM: patient  HISTORY OF PRESENT ILLNESS: Today 06/15/17  [name] is a 27 year old female with a history of migraine headaches.  She returns today for follow-up.  She states that nortriptyline has been beneficial for her headaches.  She states that there much less.  She has 6-8 headaches a month but the severity is not as bad as it was.  She states that the headache can occur on the left side of the head.  She does have photophobia, phonophobia and nausea.  She states that she can take Maxalt and the headache will resolve within an hour.  She does report that sometimes the Maxalt makes her sleepy but she can tolerate this much better than Imitrex.  She states that nortriptyline does cause drowsiness and constipation.  She is trying to manage the constipation with her diet.  But she does admit that sometimes she has had to use medication.  The patient still reports that in the near future she may consider pregnancy.  She returns today for an evaluation.  HISTORY 02/15/17 [name] is a 27 year old female with a history of migraine headaches.  She returns today for follow-up.  She states that she was taking Zonegran but was unable to tolerate the side effects.  She states that she has 3-5 severe migraines a month.  She has what she causes a regular headaches 1-2 times a week.  She reports that her headaches always occur above the left eye.  She does have photophobia and phonophobia as well as nausea.  She states that Imitrex will resolve her headache in about 45 minutes to 1 hour however she has nausea and dizziness after taking Imitrex.  She states that she also has a complete loss of vision in the left eye during these migraines.  She states prior to her headache she will experience some blurry vision and "haziness" in the peripheral vision.  In the past she is tried Zonegran and propranolol without benefit.   She returns today for an evaluation.   REVIEW OF SYSTEMS: Out of a complete 14 system review of symptoms, the patient complains only of the following symptoms, and all other reviewed systems are negative.  Light sensitivity, double vision, loss of vision, eye pain, blurred vision, shortness of breath, ear pain, ringing in ears, constipation, nausea, joint pain, back pain, nervous/anxious, shift work, headache, dizziness, anemia, bruise/bleed easily ALLERGIES: Allergies  Allergen Reactions  . Latex Rash and Other (See Comments)    Not documented in historical     HOME MEDICATIONS: Outpatient Medications Prior to Visit  Medication Sig Dispense Refill  . nortriptyline (PAMELOR) 10 MG capsule Take 1 capsule (10 mg total) at bedtime by mouth. 30 capsule 3  . Prenatal Vit-Fe Fumarate-FA (PRENATAL PO) Take 1 Dose by mouth daily.    . rizatriptan (MAXALT) 10 MG tablet Take 1 tablet at the onset of migraine.May repeat in 2 hours if needed 10 tablet 5  . CALCIUM PO Take 1 Dose by mouth daily. Gummy form     No facility-administered medications prior to visit.     PAST MEDICAL HISTORY: Past Medical History:  Diagnosis Date  . Migraines     PAST SURGICAL HISTORY: Past Surgical History:  Procedure Laterality Date  . WISDOM TOOTH EXTRACTION  May 2012    FAMILY HISTORY: Family History  Problem Relation Age of Onset  . Hypertension Father   . Hyperlipidemia Father   .  Cancer Maternal Grandmother   . Cancer Maternal Grandfather     SOCIAL HISTORY: Social History   Socioeconomic History  . Marital status: Single    Spouse name: Not on file  . Number of children: 0  . Years of education: BSN  . Highest education level: Not on file  Social Needs  . Financial resource strain: Not on file  . Food insecurity - worry: Not on file  . Food insecurity - inability: Not on file  . Transportation needs - medical: Not on file  . Transportation needs - non-medical: Not on file    Occupational History  . 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 Smoking status: Never Smoker  . Smokeless tobacco: Never Used  Substance and Sexual Activity  . Alcohol use: Yes    Comment: Rarely  . Drug use: No  . Sexual activity: Yes    Birth control/protection: None  Other Topics Concern  . Not on file  Social History Narrative   Nurse at [employer]   Lives with roommate   Caffeine use: 3-4 times per week Neita Carp(Soda)   Right handed      PHYSICAL EXAM  Vitals:   06/15/17 0739  BP: 109/71  Pulse: 92  Weight: 104 lb 12.8 oz (47.5 kg)  Height: 4\' 10"  (1.473 m)   Body mass index is 21.9 kg/m.  Generalized: Well developed, in no acute distress   Neurological examination  Mentation: Alert oriented to time, place, history taking. Follows all commands speech and language fluent Cranial nerve II-XII: Pupils were equal round reactive to light. Extraocular movements were full, visual field were full on confrontational test. Facial sensation and strength were normal. Uvula tongue midline. Head turning and shoulder shrug  were normal and symmetric. Motor: The motor testing reveals 5 over 5 strength of all 4 extremities. Good symmetric motor tone is noted throughout.  Sensory: Sensory testing is intact to soft touch on all 4 extremities. No evidence of extinction is noted.  Coordination: Cerebellar testing reveals good finger-nose-finger and heel-to-shin bilaterally.  Gait and station: Gait is normal. Tandem gait is normal. Romberg is negative. No drift is seen.  Reflexes: Deep tendon reflexes are symmetric and normal bilaterally.   DIAGNOSTIC DATA (LABS, IMAGING, TESTING) - I reviewed patient records, labs, notes, testing and imaging myself where available.  Lab Results  Component Value Date   WBC 3.6 (L) 05/11/2017   HGB 14.4 05/11/2017   HCT 40.2 05/11/2017   MCV 89.3 05/11/2017   PLT 92 (L) 05/11/2017      Component Value Date/Time   NA 137 05/11/2017 1844   K  4.0 05/11/2017 1844   CL 102 05/11/2017 1844   CO2 26 05/11/2017 1844   GLUCOSE 97 05/11/2017 1844   BUN 11 05/11/2017 1844   CREATININE 0.76 05/11/2017 1844   CALCIUM 8.7 (L) 05/11/2017 1844   GFRNONAA >60 05/11/2017 1844   GFRAA >60 05/11/2017 1844    Lab Results  Component Value Date   TSH 3.71 12/23/2015      ASSESSMENT AND PLAN 27 y.o. year old female  has a past medical history of Migraines. here with:  1.  Migraine headaches  At this time the patient feels that nortriptyline is tolerable.  She will remain on nortriptyline 10 mg at bedtime.  I advised that if constipation becomes an issue we may need to change medications.  She verbalized understanding.  In the future we may try gabapentin.  She will continue using  the Maxalt as needed.  If her symptoms worsen or she develops new symptoms she will let us know.  She will follow-up in 6 months or sooner if needed.   I spent 15 minutes with the patient. 50% of this time was spent discussing medication   Butch Penny, MSN, NP-C 06/15/2017, 7:48 AM [facility] [address] [place], [place] [address] [phone]

## 2017-06-15 NOTE — Progress Notes (Signed)
I have read the note, and I agree with the clinical assessment and plan.  Jhostin Epps K Ziere Docken   

## 2017-06-23 ENCOUNTER — Other Ambulatory Visit: Payer: Self-pay | Admitting: Adult Health

## 2017-08-08 ENCOUNTER — Ambulatory Visit (INDEPENDENT_AMBULATORY_CARE_PROVIDER_SITE_OTHER): Payer: 59 | Admitting: Advanced Practice Midwife

## 2017-08-08 ENCOUNTER — Encounter: Payer: Self-pay | Admitting: Advanced Practice Midwife

## 2017-08-08 VITALS — BP 104/69 | HR 96 | Wt 110.0 lb

## 2017-08-08 DIAGNOSIS — Z3400 Encounter for supervision of normal first pregnancy, unspecified trimester: Secondary | ICD-10-CM

## 2017-08-08 DIAGNOSIS — Z3687 Encounter for antenatal screening for uncertain dates: Secondary | ICD-10-CM | POA: Diagnosis not present

## 2017-08-08 DIAGNOSIS — Z3401 Encounter for supervision of normal first pregnancy, first trimester: Secondary | ICD-10-CM | POA: Diagnosis not present

## 2017-08-08 DIAGNOSIS — Z113 Encounter for screening for infections with a predominantly sexual mode of transmission: Secondary | ICD-10-CM | POA: Diagnosis not present

## 2017-08-08 HISTORY — DX: Encounter for supervision of normal first pregnancy, unspecified trimester: Z34.00

## 2017-08-08 NOTE — Progress Notes (Signed)
DATING AND VIABILITY SONOGRAM   Jordan [name] is a 27 y.o. year old G1P0000 with LMP Patient's last menstrual period was 06/27/2017. which would correlate to  [redacted]w[redacted]d weeks gestation.  She has regular menstrual cycles.   She is here today for a confirmatory initial sonogram.    GESTATION: SINGLETON 1     FETAL ACTIVITY:          Heart rate         119          The fetus is inactive.  PLACENTA LOCALIZATION:   GRADE   CERVIX: Measures  cm  ADNEXA: The ovaries are normal. RT CL   GESTATIONAL AGE AND  BIOMETRICS:  Gestational criteria: Estimated Date of Delivery: 04/03/18 by LMP now at [redacted]w[redacted]d  Previous Scans:6  GESTATIONAL SAC            mm           weeks  CROWN RUMP LENGTH           3.5 mm         6 weeks                                                                               AVERAGE EGA(BY THIS SCAN):  6 weeks  WORKING EDD( LMP ):  04/03/17     TECHNICIAN COMMENTS:  Early bedside U/S matches LMP   A copy of this report including all images has been saved and backed up to a second source for retrieval if needed. All measures and details of the anatomical scan, placentation, fluid volume and pelvic anatomy are contained in that report.  Blossom Hoops 08/08/2017 9:37 AM

## 2017-08-08 NOTE — Patient Instructions (Signed)
First Trimester of Pregnancy The first trimester of pregnancy is from week 1 until the end of week 13 (months 1 through 3). During this time, your baby will begin to develop inside you. At 6-8 weeks, the eyes and face are formed, and the heartbeat can be seen on ultrasound. At the end of 12 weeks, all the baby's organs are formed. Prenatal care is all the medical care you receive before the birth of your baby. Make sure you get good prenatal care and follow all of your doctor's instructions. Follow these instructions at home: Medicines  Take over-the-counter and prescription medicines only as told by your doctor. Some medicines are safe and some medicines are not safe during pregnancy.  Take a prenatal vitamin that contains at least 600 micrograms (mcg) of folic acid.  If you have trouble pooping (constipation), take medicine that will make your stool soft (stool softener) if your doctor approves. Eating and drinking  Eat regular, healthy meals.  Your doctor will tell you the amount of weight gain that is right for you.  Avoid raw meat and uncooked cheese.  If you feel sick to your stomach (nauseous) or throw up (vomit): ? Eat 4 or 5 small meals a day instead of 3 large meals. ? Try eating a few soda crackers. ? Drink liquids between meals instead of during meals.  To prevent constipation: ? Eat foods that are high in fiber, like fresh fruits and vegetables, whole grains, and beans. ? Drink enough fluids to keep your pee (urine) clear or pale yellow. Activity  Exercise only as told by your doctor. Stop exercising if you have cramps or pain in your lower belly (abdomen) or low back.  Do not exercise if it is too hot, too humid, or if you are in a place of great height (high altitude).  Try to avoid standing for long periods of time. Move your legs often if you must stand in one place for a long time.  Avoid heavy lifting.  Wear low-heeled shoes. Sit and stand up straight.  You  can have sex unless your doctor tells you not to. Relieving pain and discomfort  Wear a good support bra if your breasts are sore.  Take warm water baths (sitz baths) to soothe pain or discomfort caused by hemorrhoids. Use hemorrhoid cream if your doctor says it is okay.  Rest with your legs raised if you have leg cramps or low back pain.  If you have puffy, bulging veins (varicose veins) in your legs: ? Wear support hose or compression stockings as told by your doctor. ? Raise (elevate) your feet for 15 minutes, 3-4 times a day. ? Limit salt in your food. Prenatal care  Schedule your prenatal visits by the twelfth week of pregnancy.  Write down your questions. Take them to your prenatal visits.  Keep all your prenatal visits as told by your doctor. This is important. Safety  Wear your seat belt at all times when driving.  Make a list of emergency phone numbers. The list should include numbers for family, friends, the hospital, and police and fire departments. General instructions  Ask your doctor for a referral to a local prenatal class. Begin classes no later than at the start of month 6 of your pregnancy.  Ask for help if you need counseling or if you need help with nutrition. Your doctor can give you advice or tell you where to go for help.  Do not use hot tubs, steam rooms, or   saunas.  Do not douche or use tampons or scented sanitary pads.  Do not cross your legs for long periods of time.  Avoid all herbs and alcohol. Avoid drugs that are not approved by your doctor.  Do not use any tobacco products, including cigarettes, chewing tobacco, and electronic cigarettes. If you need help quitting, ask your doctor. You may get counseling or other support to help you quit.  Avoid cat litter boxes and soil used by cats. These carry germs that can cause birth defects in the baby and can cause a loss of your baby (miscarriage) or stillbirth.  Visit your dentist. At home, brush  your teeth with a soft toothbrush. Be gentle when you floss. Contact a doctor if:  You are dizzy.  You have mild cramps or pressure in your lower belly.  You have a nagging pain in your belly area.  You continue to feel sick to your stomach, you throw up, or you have watery poop (diarrhea).  You have a bad smelling fluid coming from your vagina.  You have pain when you pee (urinate).  You have increased puffiness (swelling) in your face, hands, legs, or ankles. Get help right away if:  You have a fever.  You are leaking fluid from your vagina.  You have spotting or bleeding from your vagina.  You have very bad belly cramping or pain.  You gain or lose weight rapidly.  You throw up blood. It may look like coffee grounds.  You are around people who have German measles, fifth disease, or chickenpox.  You have a very bad headache.  You have shortness of breath.  You have any kind of trauma, such as from a fall or a car accident. Summary  The first trimester of pregnancy is from week 1 until the end of week 13 (months 1 through 3).  To take care of yourself and your unborn baby, you will need to eat healthy meals, take medicines only if your doctor tells you to do so, and do activities that are safe for you and your baby.  Keep all follow-up visits as told by your doctor. This is important as your doctor will have to ensure that your baby is healthy and growing well. This information is not intended to replace advice given to you by your health care provider. Make sure you discuss any questions you have with your health care provider. Document Released: 09/08/2007 Document Revised: 03/30/2016 Document Reviewed: 03/30/2016 Elsevier Interactive Patient Education  2017 Elsevier Inc.  

## 2017-08-09 ENCOUNTER — Encounter: Payer: Self-pay | Admitting: Advanced Practice Midwife

## 2017-08-09 LAB — TIQ- AMBIGUOUS ORDER

## 2017-08-09 LAB — URINE CYTOLOGY ANCILLARY ONLY
CHLAMYDIA, DNA PROBE: NEGATIVE
NEISSERIA GONORRHEA: NEGATIVE

## 2017-08-09 NOTE — Progress Notes (Signed)
  Subjective:    Jordan [name] is a G1P0000 [redacted]w[redacted]d being seen today for her first obstetrical visit.  Her obstetrical history is significant for Primigravida. Patient does intend to breast feed. Pregnancy history fully reviewed.  Patient reports no complaints.  Vitals:   08/08/17 0901  BP: 104/69  Pulse: 96  Weight: 110 lb (49.9 kg)    HISTORY: OB History  Gravida Para Term Preterm AB Living  1 0 0 0 0 0  SAB TAB Ectopic Multiple Live Births  0 0 0 0 0    # Outcome Date GA Lbr Len/2nd Weight Sex Delivery Anes PTL Lv  1 Current            Past Medical History:  Diagnosis Date  . Migraines    Past Surgical History:  Procedure Laterality Date  . WISDOM TOOTH EXTRACTION  May 2012   Family History  Problem Relation Age of Onset  . Hypertension Father   . Hyperlipidemia Father   . Cancer Maternal Grandmother   . Cancer Maternal Grandfather      Exam    Uterus:     Pelvic Exam:    Perineum: Normal Perineum   Vulva: Bartholin's, Urethra, Skene's normal   Vagina:  normal mucosa, normal discharge   pH:    Cervix: no cervical motion tenderness   Adnexa: no mass, fullness, tenderness   Bony Pelvis: gynecoid  System: Breast:  normal appearance, no masses or tenderness   Skin: normal coloration and turgor, no rashes    Neurologic: oriented, grossly non-focal   Extremities: normal strength, tone, and muscle mass   HEENT neck supple with midline trachea   Mouth/Teeth mucous membranes moist, pharynx normal without lesions   Neck supple   Cardiovascular: regular rate and rhythm   Respiratory:  appears well, vitals normal, no respiratory distress, acyanotic, normal RR, ear and throat exam is normal, neck free of mass or lymphadenopathy, chest clear, no wheezing, crepitations, rhonchi, normal symmetric air entry   Abdomen: soft, non-tender; bowel sounds normal; no masses,  no organomegaly   Urinary: urethral meatus normal      Assessment:    Pregnancy:  G1P0000 Patient Active Problem List   Diagnosis Date Noted  . Supervision of normal first pregnancy, antepartum 08/08/2017  . Ophthalmoplegic migraine, not intractable 10/08/2016  . B12 deficiency anemia 11/12/2011  . Vitamin D deficiency 11/12/2011        Plan:     Initial labs drawn. Prenatal vitamins. Problem list reviewed and updated. Genetic Screening discussed First Screen and Integrated Screen: undecided.  Ultrasound discussed; fetal survey: requested.  Follow up in 6 weeks per [employer]. 50% of 30 min visit spent on counseling and coordination of care.  Routines reviewed Welcomed to practice, discussed staffing and learners   Wynelle Bourgeois 08/09/2017

## 2017-08-10 LAB — OBSTETRIC PANEL, INCLUDING HIV
Antibody Screen: NEGATIVE
Basophils Absolute: 0 10*3/uL (ref 0.0–0.2)
Basos: 0 %
EOS (ABSOLUTE): 0.1 10*3/uL (ref 0.0–0.4)
EOS: 1 %
HIV Screen 4th Generation wRfx: NONREACTIVE
Hematocrit: 34.1 % (ref 34.0–46.6)
Hemoglobin: 11.5 g/dL (ref 11.1–15.9)
Hepatitis B Surface Ag: NEGATIVE
IMMATURE GRANS (ABS): 0 10*3/uL (ref 0.0–0.1)
IMMATURE GRANULOCYTES: 0 %
LYMPHS: 26 %
Lymphocytes Absolute: 1.6 10*3/uL (ref 0.7–3.1)
MCH: 32 pg (ref 26.6–33.0)
MCHC: 33.7 g/dL (ref 31.5–35.7)
MCV: 95 fL (ref 79–97)
MONOCYTES: 6 %
MONOS ABS: 0.4 10*3/uL (ref 0.1–0.9)
NEUTROS PCT: 67 %
Neutrophils Absolute: 4.1 10*3/uL (ref 1.4–7.0)
Platelets: 220 10*3/uL (ref 150–379)
RBC: 3.59 x10E6/uL — AB (ref 3.77–5.28)
RDW: 14.7 % (ref 12.3–15.4)
RH TYPE: POSITIVE
RPR: NONREACTIVE
Rubella Antibodies, IGG: 1.52 index (ref >0.99)
WBC: 6.2 10*3/uL (ref 3.4–10.8)

## 2017-08-11 LAB — CULTURE, OB URINE

## 2017-08-11 LAB — URINE CULTURE, OB REFLEX: ORGANISM ID, BACTERIA: NO GROWTH

## 2017-08-30 ENCOUNTER — Other Ambulatory Visit: Payer: Self-pay

## 2017-08-30 ENCOUNTER — Encounter (HOSPITAL_BASED_OUTPATIENT_CLINIC_OR_DEPARTMENT_OTHER): Payer: Self-pay

## 2017-08-30 ENCOUNTER — Emergency Department (HOSPITAL_BASED_OUTPATIENT_CLINIC_OR_DEPARTMENT_OTHER)
Admission: EM | Admit: 2017-08-30 | Discharge: 2017-08-30 | Disposition: A | Discharge status: Home / Self Care | Payer: 59 | Attending: Emergency Medicine | Admitting: Emergency Medicine

## 2017-08-30 DIAGNOSIS — Z91040 Latex allergy status: Secondary | ICD-10-CM | POA: Insufficient documentation

## 2017-08-30 DIAGNOSIS — R519 Headache, unspecified: Secondary | ICD-10-CM

## 2017-08-30 DIAGNOSIS — R42 Dizziness and giddiness: Secondary | ICD-10-CM | POA: Diagnosis not present

## 2017-08-30 DIAGNOSIS — Z79899 Other long term (current) drug therapy: Secondary | ICD-10-CM | POA: Diagnosis not present

## 2017-08-30 DIAGNOSIS — R51 Headache: Secondary | ICD-10-CM | POA: Diagnosis not present

## 2017-08-30 DIAGNOSIS — G43909 Migraine, unspecified, not intractable, without status migrainosus: Secondary | ICD-10-CM | POA: Diagnosis not present

## 2017-08-30 MED ORDER — METOCLOPRAMIDE HCL 5 MG/ML IJ SOLN
10.0000 mg | Freq: Once | INTRAMUSCULAR | Status: AC
Start: 2017-08-30 — End: 2017-08-30
  Administered 2017-08-30: 10 mg via INTRAVENOUS
  Filled 2017-08-30: qty 2

## 2017-08-30 MED ORDER — SODIUM CHLORIDE 0.9 % IV BOLUS
1000.0000 mL | Freq: Once | INTRAVENOUS | Status: AC
Start: 2017-08-30 — End: 2017-08-30
  Administered 2017-08-30: 1000 mL via INTRAVENOUS

## 2017-08-30 MED ORDER — DIPHENHYDRAMINE HCL 50 MG/ML IJ SOLN
25.0000 mg | Freq: Once | INTRAMUSCULAR | Status: AC
  Administered 2017-08-30: 25 mg via INTRAVENOUS
  Filled 2017-08-30: qty 1

## 2017-08-30 NOTE — ED Provider Notes (Signed)
[facility] EMERGENCY DEPARTMENT Provider Note   CSN: [id number] Arrival date & time: 08/30/17  1722     History   Chief Complaint Chief Complaint  Patient presents with  . Migraine    HPI AMI MALLY is a 27 y.o. female.  ADYLINE HUBERTY is a 27 y.o. Female with history of migraines, who presents to the emergency department for evaluation of headache.  Patient reports headache started yesterday afternoon, it is primarily present over the left side of the head and presents exactly like her typical migraine.  Headache was not maximal in intensity at onset.  She denies any associated fevers, no vision changes but does note photophobia.,  Patient reports some intermittent dizziness, but this is typical of her migraines.  She denies any fevers, no neck stiffness.  No nausea or vomiting.  Patient reports she is currently [redacted] weeks pregnant, and has only been able to take Tylenol for this migraine, prior to becoming pregnant and took daily nortriptyline and sumatriptan as needed for abortive therapy.  Patient denies any weakness, numbness or tingling in her extremities.  No chest pain, shortness of breath or abdominal pain.  No other aggravating or alleviating factors.     Past Medical History:  Diagnosis Date  . Migraines     Patient Active Problem List   Diagnosis Date Noted  . Supervision of normal first pregnancy, antepartum 08/08/2017  . Ophthalmoplegic migraine, not intractable 10/08/2016  . B12 deficiency anemia 11/12/2011  . Vitamin D deficiency 11/12/2011    Past Surgical History:  Procedure Laterality Date  . WISDOM TOOTH EXTRACTION  May 2012     OB History    Gravida  1   Para  0   Term  0   Preterm  0   AB  0   Living  0     SAB  0   TAB  0   Ectopic  0   Multiple  0   Live Births  0            Home Medications    Prior to Admission medications   Medication Sig Start Date End Date Taking? Authorizing Provider    nortriptyline (PAMELOR) 10 MG capsule TAKE 1 CAPSULE (10 MG TOTAL) AT BEDTIME BY MOUTH. Patient not taking: Reported on 08/08/2017 06/23/17   York Spaniel, MD  Prenatal Vit-Fe Fumarate-FA (PRENATAL PO) Take 1 Dose by mouth daily.    [provider]  rizatriptan (MAXALT) 10 MG tablet Take 1 tablet at the onset of migraine.May repeat in 2 hours if needed Patient not taking: Reported on 08/08/2017 02/15/17   Butch Penny, NP    Family History Family History  Problem Relation Age of Onset  . Hypertension Father   . Hyperlipidemia Father   . Cancer Maternal Grandmother   . Cancer Maternal Grandfather     Social History Social History   Tobacco Use  . Smoking status: Never Smoker  . Smokeless tobacco: Never Used  Substance Use Topics  . Alcohol use: Not Currently  . Drug use: No     Allergies   Latex   Review of Systems Review of Systems  Constitutional: Negative for chills and fever.  HENT: Negative for congestion, drooling, facial swelling, rhinorrhea and sore throat.   Eyes: Positive for photophobia. Negative for visual disturbance.  Respiratory: Negative for shortness of breath.   Cardiovascular: Negative for chest pain.  Gastrointestinal: Negative for abdominal pain, nausea and vomiting.  Musculoskeletal: Negative  for neck pain and neck stiffness.  Skin: Negative for color change and rash.  Neurological: Positive for dizziness and headaches. Negative for seizures, syncope, facial asymmetry, speech difficulty, weakness, light-headedness and numbness.     Physical Exam Updated Vital Signs BP 125/66 (BP Location: Left Arm)   Pulse 82   Temp 98.6 F (37 C) (Oral)   Resp 18   Ht  (1.473 m)   Wt 49 kg (108 lb)   LMP 06/27/2017   SpO2 98%   BMI 22.57 kg/m   Physical Exam  Constitutional: She is oriented to person, place, and time. She appears well-developed and well-nourished. No distress.  HENT:  Head: Normocephalic and atraumatic.   Mouth/Throat: Oropharynx is clear and moist.  Eyes: Pupils are equal, round, and reactive to light. EOM are normal. Right eye exhibits no discharge. Left eye exhibits no discharge.  No nystagmus  Neck: Neck supple.  No nuchal rigidity  Cardiovascular: Normal rate, regular rhythm, normal heart sounds and intact distal pulses.  Pulmonary/Chest: Effort normal and breath sounds normal. No stridor. No respiratory distress. She has no wheezes. She has no rales.  Respirations equal and unlabored, patient able to speak in full sentences, lungs clear to auscultation bilaterally  Abdominal: Soft. Bowel sounds are normal. She exhibits no distension and no mass. There is no tenderness. There is no guarding.  Abdomen soft, nondistended, nontender to palpation in all quadrants without guarding or peritoneal signs  Neurological: She is alert and oriented to person, place, and time. Coordination normal.  Speech is clear, able to follow commands CN III-XII intact Normal strength in upper and lower extremities bilaterally including dorsiflexion and plantar flexion, strong and equal grip strength Sensation normal to light and sharp touch Moves extremities without ataxia, coordination intact Normal finger to nose and rapid alternating movements No pronator drift  Skin: Skin is warm and dry. Capillary refill takes less than 2 seconds. She is not diaphoretic.  Psychiatric: She has a normal mood and affect. Her behavior is normal.  Nursing note and vitals reviewed.    ED Treatments / Results  Labs (all labs ordered are listed, but only abnormal results are displayed) Labs Reviewed - No data to display  EKG None  Radiology No results found.  Procedures Procedures (including critical care time)  Medications Ordered in ED Medications  sodium chloride 0.9 % bolus 1,000 mL (1,000 mLs Intravenous New Bag/Given 08/30/17 1911)  metoCLOPramide (REGLAN) injection 10 mg (10 mg Intravenous Given 08/30/17 1911)   diphenhydrAMINE (BENADRYL) injection 25 mg (25 mg Intravenous Given 08/30/17 1911)     Initial Impression / Assessment and Plan / ED Course  I have reviewed the triage vital signs and the nursing notes.  Pertinent labs & imaging results that were available during my care of the patient were reviewed by me and considered in my medical decision making (see chart for details).  Presents with her typical migraine, is currently pregnant and is unable to take her typical nortriptyline or Imitrex, has tried Tylenol without relief.  Pt HA treated and improved while in ED.  Presentation is like pts typical HA and non concerning for [facility], ICH, Meningitis, or temporal arteritis. Pt is afebrile with no focal neuro deficits, nuchal rigidity, or change in vision. Pt is to follow up with PCP. Pt verbalizes understanding and is agreeable with plan to dc.   Final Clinical Impressions(s) / ED Diagnoses   Final diagnoses:  Bad headache    ED Discharge Orders  None       Legrand Rams 08/31/17 Margarette Canada    Benjiman Core, MD 08/31/17 1447

## 2017-08-30 NOTE — ED Triage Notes (Signed)
C/o migraine day 2-states she is [redacted] weeks pregnant-NAD-steady gait

## 2017-08-30 NOTE — Discharge Instructions (Signed)
You may continue to use Tylenol for headaches as needed at home.  Follow-up with your regular doctor, return to the ED if any of the below scenarios develop.  Get help right away if: Your migraine gets very bad. You have a fever. You have a stiff neck. You have trouble seeing. Your muscles feel weak or like you cannot control them. You start to lose your balance a lot. You start to have trouble walking. You pass out (faint).

## 2017-09-19 ENCOUNTER — Ambulatory Visit (INDEPENDENT_AMBULATORY_CARE_PROVIDER_SITE_OTHER): Payer: 59 | Admitting: Certified Nurse Midwife

## 2017-09-19 ENCOUNTER — Encounter: Payer: Self-pay | Admitting: Certified Nurse Midwife

## 2017-09-19 VITALS — BP 98/64 | HR 87 | Wt 111.0 lb

## 2017-09-19 DIAGNOSIS — Z3401 Encounter for supervision of normal first pregnancy, first trimester: Secondary | ICD-10-CM

## 2017-09-19 DIAGNOSIS — Z1379 Encounter for other screening for genetic and chromosomal anomalies: Secondary | ICD-10-CM

## 2017-09-19 DIAGNOSIS — Z3400 Encounter for supervision of normal first pregnancy, unspecified trimester: Secondary | ICD-10-CM

## 2017-09-19 NOTE — Patient Instructions (Signed)
Second Trimester of Pregnancy The second trimester is from week 13 through week 28, month 4 through 6. This is often the time in pregnancy that you feel your best. Often times, morning sickness has lessened or quit. You may have more energy, and you may get hungry more often. Your unborn baby (fetus) is growing rapidly. At the end of the sixth month, he or she is about 9 inches long and weighs about 1 pounds. You will likely feel the baby move (quickening) between 18 and 20 weeks of pregnancy. Follow these instructions at home:  Avoid all smoking, herbs, and alcohol. Avoid drugs not approved by your doctor.  Do not use any tobacco products, including cigarettes, chewing tobacco, and electronic cigarettes. If you need help quitting, ask your doctor. You may get counseling or other support to help you quit.  Only take medicine as told by your doctor. Some medicines are safe and some are not during pregnancy.  Exercise only as told by your doctor. Stop exercising if you start having cramps.  Eat regular, healthy meals.  Wear a good support bra if your breasts are tender.  Do not use hot tubs, steam rooms, or saunas.  Wear your seat belt when driving.  Avoid raw meat, uncooked cheese, and liter boxes and soil used by cats.  Take your prenatal vitamins.  Take 1500-2000 milligrams of calcium daily starting at the 20th week of pregnancy until you deliver your baby.  Try taking medicine that helps you poop (stool softener) as needed, and if your doctor approves. Eat more fiber by eating fresh fruit, vegetables, and whole grains. Drink enough fluids to keep your pee (urine) clear or pale yellow.  Take warm water baths (sitz baths) to soothe pain or discomfort caused by hemorrhoids. Use hemorrhoid cream if your doctor approves.  If you have puffy, bulging veins (varicose veins), wear support hose. Raise (elevate) your feet for 15 minutes, 3-4 times a day. Limit salt in your diet.  Avoid heavy  lifting, wear low heals, and sit up straight.  Rest with your legs raised if you have leg cramps or low back pain.  Visit your dentist if you have not gone during your pregnancy. Use a soft toothbrush to brush your teeth. Be gentle when you floss.  You can have sex (intercourse) unless your doctor tells you not to.  Go to your doctor visits. Get help if:  You feel dizzy.  You have mild cramps or pressure in your lower belly (abdomen).  You have a nagging pain in your belly area.  You continue to feel sick to your stomach (nauseous), throw up (vomit), or have watery poop (diarrhea).  You have bad smelling fluid coming from your vagina.  You have pain with peeing (urination). Get help right away if:  You have a fever.  You are leaking fluid from your vagina.  You have spotting or bleeding from your vagina.  You have severe belly cramping or pain.  You lose or gain weight rapidly.  You have trouble catching your breath and have chest pain.  You notice sudden or extreme puffiness (swelling) of your face, hands, ankles, feet, or legs.  You have not felt the baby move in over an hour.  You have severe headaches that do not go away with medicine.  You have vision changes. This information is not intended to replace advice given to you by your health care provider. Make sure you discuss any questions you have with your health care   provider. Document Released: 06/16/2009 Document Revised: 08/28/2015 Document Reviewed: 05/23/2012 Elsevier Interactive Patient Education  2017 Elsevier Inc.  

## 2017-09-19 NOTE — Progress Notes (Signed)
   PRENATAL VISIT NOTE  Subjective:  Jordan [name] is a 27 y.o. G1P0000 at 6071w0d being seen today for ongoing prenatal care.  She is currently monitored for the following issues for this low-risk pregnancy and has Ophthalmoplegic migraine, not intractable; Supervision of normal first pregnancy, antepartum; B12 deficiency anemia; and Vitamin D deficiency on their problem list.  Patient reports no complaints.   . Vag. Bleeding: None.  Movement: Absent. Denies leaking of fluid.   The following portions of the patient's history were reviewed and updated as appropriate: allergies, current medications, past family history, past medical history, past social history, past surgical history and problem list. Problem list updated.  Objective:   Vitals:   09/19/17 1023  BP: 98/64  Pulse: 87  Weight: 111 lb (50.3 kg)    Fetal Status: Fetal Heart Rate (bpm): 162   Movement: Absent     General:  Alert, oriented and cooperative. Patient is in no acute distress.  Skin: Skin is warm and dry. No rash noted.   Cardiovascular: Normal heart rate noted  Respiratory: Normal respiratory effort, no problems with respiration noted  Abdomen: Soft, gravid, appropriate for gestational age.  Pain/Pressure: Absent     Pelvic: Cervical exam deferred        Extremities: Normal range of motion.  Edema: None  Mental Status: Normal mood and affect. Normal behavior. Normal judgment and thought content.   Assessment and Plan:  Pregnancy: G1P0000 at 8171w0d  1. Supervision of normal first pregnancy, antepartum -Patient doing well, no complaints  -Discussed genetic screening with patient, patient desires screening for down syndrome and spina bifida. Educated on Panorama then AFP around 20 weeks. Patient agrees with POC- does not want to know gender at this time with screening, she is planning gender reveal.  -Anticipatory guidance on upcoming appointments with US around 19 weeks  - Genetic Screening  2. Encounter  for genetic screening -Panorama  - Genetic Screening  Preterm labor symptoms and general obstetric precautions including but not limited to vaginal bleeding, contractions, leaking of fluid and fetal movement were reviewed in detail with the patient. Please refer to After Visit Summary for other counseling recommendations.  Return in about 1 month (around 10/17/2017) for ROB.  Future Appointments  Date Time Provider Department Center  10/17/2017 10:00 AM Aviva SignsWilliams, Marie L, CNM [facility]  12/21/2017  9:30 AM Butch PennyMillikan, Megan, NP [facility] None    Sharyon CableVeronica C Anjelica Gorniak, CNM

## 2017-10-17 ENCOUNTER — Encounter: Payer: Self-pay | Admitting: Advanced Practice Midwife

## 2017-10-17 ENCOUNTER — Ambulatory Visit (INDEPENDENT_AMBULATORY_CARE_PROVIDER_SITE_OTHER): Payer: 59 | Admitting: Advanced Practice Midwife

## 2017-10-17 VITALS — BP 96/64 | HR 72 | Wt 113.0 lb

## 2017-10-17 DIAGNOSIS — Z3402 Encounter for supervision of normal first pregnancy, second trimester: Secondary | ICD-10-CM | POA: Diagnosis not present

## 2017-10-17 DIAGNOSIS — Z3400 Encounter for supervision of normal first pregnancy, unspecified trimester: Secondary | ICD-10-CM

## 2017-10-17 NOTE — Progress Notes (Deleted)
   PRENATAL VISIT NOTE  Subjective:  Jordan [name] is a 27 y.o. G1P0000 at 5337w0d being seen today for ongoing prenatal care.  She is currently monitored for the following issues for this {Blank single:19197::"high-risk","low-risk"} pregnancy and has Ophthalmoplegic migraine, not intractable; Supervision of normal first pregnancy, antepartum; B12 deficiency anemia; and Vitamin D deficiency on their problem list.  Patient reports {sx:14538}.   . Vag. Bleeding: None.  Movement: Absent. Denies leaking of fluid.   The following portions of the patient's history were reviewed and updated as appropriate: allergies, current medications, past family history, past medical history, past social history, past surgical history and problem list. Problem list updated.  Objective:   Vitals:   10/17/17 0954  BP: 96/64  Pulse: 72  Weight: 113 lb (51.3 kg)    Fetal Status: Fetal Heart Rate (bpm): 159   Movement: Absent     General:  Alert, oriented and cooperative. Patient is in no acute distress.  Skin: Skin is warm and dry. No rash noted.   Cardiovascular: Normal heart rate noted  Respiratory: Normal respiratory effort, no problems with respiration noted  Abdomen: Soft, gravid, appropriate for gestational age.  Pain/Pressure: Absent     Pelvic: {Blank single:19197::"Cervical exam performed","Cervical exam deferred"}        Extremities: Normal range of motion.  Edema: None  Mental Status: Normal mood and affect. Normal behavior. Normal judgment and thought content.   Assessment and Plan:  Pregnancy: G1P0000 at 937w0d  1. Supervision of normal first pregnancy, antepartum *** - Alpha fetoprotein, maternal  {Blank single:19197::"Term","Preterm"} labor symptoms and general obstetric precautions including but not limited to vaginal bleeding, contractions, leaking of fluid and fetal movement were reviewed in detail with the patient. Please refer to After Visit Summary for other counseling  recommendations.  No follow-ups on file.  Future Appointments  Date Time Provider Department Center  11/14/2017  8:30 AM Donette LarryBhambri, Melanie, CNM [facility]  12/21/2017  9:30 AM Butch PennyMillikan, Megan, NP [facility] None    Wynelle BourgeoisMarie Aaleah Hirsch, CNM

## 2017-10-17 NOTE — Progress Notes (Signed)
   PRENATAL VISIT NOTE  Subjective:  Jordan [name] is a 27 y.o. G1P0000 at 8557w0d being seen today for ongoing prenatal care.  She is currently monitored for the following issues for this low-risk pregnancy and has Ophthalmoplegic migraine, not intractable; Supervision of normal first pregnancy, antepartum; B12 deficiency anemia; and Vitamin D deficiency on their problem list.  Patient reports no complaints.   . Vag. Bleeding: None.  Movement: Absent. Denies leaking of fluid.   The following portions of the patient's history were reviewed and updated as appropriate: allergies, current medications, past family history, past medical history, past social history, past surgical history and problem list. Problem list updated.  Objective:   Vitals:   10/17/17 0954  BP: 96/64  Pulse: 72  Weight: 113 lb (51.3 kg)    Fetal Status: Fetal Heart Rate (bpm): 159   Movement: Absent     General:  Alert, oriented and cooperative. Patient is in no acute distress.  Skin: Skin is warm and dry. No rash noted.   Cardiovascular: Normal heart rate noted  Respiratory: Normal respiratory effort, no problems with respiration noted  Abdomen: Soft, gravid, appropriate for gestational age.  Pain/Pressure: Absent     Pelvic: Cervical exam deferred        Extremities: Normal range of motion.  Edema: None  Mental Status: Normal mood and affect. Normal behavior. Normal judgment and thought content.   Assessment and Plan:  Pregnancy: G1P0000 at 6957w0d  1. Supervision of normal first pregnancy, antepartum      Reviewed panorama test was normal femal - Alpha fetoprotein, maternal  Preterm labor symptoms and general obstetric precautions including but not limited to vaginal bleeding, contractions, leaking of fluid and fetal movement were reviewed in detail with the patient. Please refer to After Visit Summary for other counseling recommendations.    Future Appointments  Date Time Provider Department Center    11/14/2017  8:30 AM Donette LarryBhambri, Melanie, CNM [facility]  12/21/2017  9:30 AM Butch PennyMillikan, Megan, NP [facility] None    Wynelle BourgeoisMarie Elham Fini, CNM

## 2017-10-17 NOTE — Patient Instructions (Signed)
Second Trimester of Pregnancy The second trimester is from week 13 through week 28, month 4 through 6. This is often the time in pregnancy that you feel your best. Often times, morning sickness has lessened or quit. You may have more energy, and you may get hungry more often. Your unborn baby (fetus) is growing rapidly. At the end of the sixth month, he or she is about 9 inches long and weighs about 1 pounds. You will likely feel the baby move (quickening) between 18 and 20 weeks of pregnancy. Follow these instructions at home:  Avoid all smoking, herbs, and alcohol. Avoid drugs not approved by your doctor.  Do not use any tobacco products, including cigarettes, chewing tobacco, and electronic cigarettes. If you need help quitting, ask your doctor. You may get counseling or other support to help you quit.  Only take medicine as told by your doctor. Some medicines are safe and some are not during pregnancy.  Exercise only as told by your doctor. Stop exercising if you start having cramps.  Eat regular, healthy meals.  Wear a good support bra if your breasts are tender.  Do not use hot tubs, steam rooms, or saunas.  Wear your seat belt when driving.  Avoid raw meat, uncooked cheese, and liter boxes and soil used by cats.  Take your prenatal vitamins.  Take 1500-2000 milligrams of calcium daily starting at the 20th week of pregnancy until you deliver your baby.  Try taking medicine that helps you poop (stool softener) as needed, and if your doctor approves. Eat more fiber by eating fresh fruit, vegetables, and whole grains. Drink enough fluids to keep your pee (urine) clear or pale yellow.  Take warm water baths (sitz baths) to soothe pain or discomfort caused by hemorrhoids. Use hemorrhoid cream if your doctor approves.  If you have puffy, bulging veins (varicose veins), wear support hose. Raise (elevate) your feet for 15 minutes, 3-4 times a day. Limit salt in your diet.  Avoid heavy  lifting, wear low heals, and sit up straight.  Rest with your legs raised if you have leg cramps or low back pain.  Visit your dentist if you have not gone during your pregnancy. Use a soft toothbrush to brush your teeth. Be gentle when you floss.  You can have sex (intercourse) unless your doctor tells you not to.  Go to your doctor visits. Get help if:  You feel dizzy.  You have mild cramps or pressure in your lower belly (abdomen).  You have a nagging pain in your belly area.  You continue to feel sick to your stomach (nauseous), throw up (vomit), or have watery poop (diarrhea).  You have bad smelling fluid coming from your vagina.  You have pain with peeing (urination). Get help right away if:  You have a fever.  You are leaking fluid from your vagina.  You have spotting or bleeding from your vagina.  You have severe belly cramping or pain.  You lose or gain weight rapidly.  You have trouble catching your breath and have chest pain.  You notice sudden or extreme puffiness (swelling) of your face, hands, ankles, feet, or legs.  You have not felt the baby move in over an hour.  You have severe headaches that do not go away with medicine.  You have vision changes. This information is not intended to replace advice given to you by your health care provider. Make sure you discuss any questions you have with your health care   provider. Document Released: 06/16/2009 Document Revised: 08/28/2015 Document Reviewed: 05/23/2012 Elsevier Interactive Patient Education  2017 Elsevier Inc.  

## 2017-11-11 LAB — TIQ-AOE

## 2017-11-11 LAB — ALPHA FETOPROTEIN, MATERNAL

## 2017-11-14 ENCOUNTER — Ambulatory Visit (INDEPENDENT_AMBULATORY_CARE_PROVIDER_SITE_OTHER): Payer: 59 | Admitting: Certified Nurse Midwife

## 2017-11-14 VITALS — BP 101/70 | HR 75 | Wt 122.0 lb

## 2017-11-14 DIAGNOSIS — Z3401 Encounter for supervision of normal first pregnancy, first trimester: Secondary | ICD-10-CM

## 2017-11-14 DIAGNOSIS — Z3400 Encounter for supervision of normal first pregnancy, unspecified trimester: Secondary | ICD-10-CM

## 2017-11-14 LAB — POCT URINALYSIS DIPSTICK OB
GLUCOSE, UA: NEGATIVE — AB
POC,PROTEIN,UA: NEGATIVE

## 2017-11-14 NOTE — Progress Notes (Signed)
Subjective:  Jordan [name] is a 27 y.o. G1P0000 at 2448w0d being seen today for ongoing prenatal care.  She is currently monitored for the following issues for this low-risk pregnancy and has Ophthalmoplegic migraine, not intractable; Supervision of normal first pregnancy, antepartum; B12 deficiency anemia; and Vitamin D deficiency on their problem list.  Patient reports no complaints.  Contractions: Not present. Vag. Bleeding: None.  Movement: Present. Denies leaking of fluid.   The following portions of the patient's history were reviewed and updated as appropriate: allergies, current medications, past family history, past medical history, past social history, past surgical history and problem list. Problem list updated.  Objective:   Vitals:   11/14/17 0824  BP: 101/70  Pulse: 75  Weight: 55.3 kg    Fetal Status: Fetal Heart Rate (bpm): 154 Fundal Height: 20 cm Movement: Present     General:  Alert, oriented and cooperative. Patient is in no acute distress.  Skin: Skin is warm and dry. No rash noted.   Cardiovascular: Normal heart rate noted  Respiratory: Normal respiratory effort, no problems with respiration noted  Abdomen: Soft, gravid, appropriate for gestational age. Pain/Pressure: Absent     Pelvic: Vag. Bleeding: None Vag D/C Character: Thin   Cervical exam deferred        Extremities: Normal range of motion.  Edema: None  Mental Status: Normal mood and affect. Normal behavior. Normal judgment and thought content.   Urinalysis:      Assessment and Plan:  Pregnancy: G1P0000 at 1648w0d  1. Supervision of normal first pregnancy, antepartum - Alpha fetoprotein, maternal - US MFM OB COMP + 14 WK; Future - POC Urinalysis Dipstick OB  Preterm labor symptoms and general obstetric precautions including but not limited to vaginal bleeding, contractions, leaking of fluid and fetal movement were reviewed in detail with the patient. Please refer to After Visit Summary for other  counseling recommendations.  Return in about 4 weeks (around 12/12/2017).   Donette LarryBhambri, Cordarious Zeek, CNM

## 2017-11-15 LAB — ALPHA FETOPROTEIN, MATERNAL
AFP MoM: 1.13
AFP, Serum: 76.2 ng/mL
Calc'd Gestational Age: 20 weeks
MATERNAL WT: 115 [lb_av]
TWINS-AFP: 1

## 2017-11-17 ENCOUNTER — Ambulatory Visit (HOSPITAL_COMMUNITY)
Admission: RE | Admit: 2017-11-17 | Discharge: 2017-11-17 | Disposition: A | Discharge status: Home / Self Care | Payer: 59 | Source: Ambulatory Visit | Attending: Certified Nurse Midwife | Admitting: Certified Nurse Midwife

## 2017-11-17 DIAGNOSIS — Z363 Encounter for antenatal screening for malformations: Secondary | ICD-10-CM | POA: Diagnosis not present

## 2017-11-17 DIAGNOSIS — Z3402 Encounter for supervision of normal first pregnancy, second trimester: Secondary | ICD-10-CM | POA: Diagnosis not present

## 2017-11-17 DIAGNOSIS — Z3A20 20 weeks gestation of pregnancy: Secondary | ICD-10-CM | POA: Insufficient documentation

## 2017-11-17 DIAGNOSIS — Z3400 Encounter for supervision of normal first pregnancy, unspecified trimester: Secondary | ICD-10-CM | POA: Diagnosis present

## 2017-12-12 ENCOUNTER — Ambulatory Visit (INDEPENDENT_AMBULATORY_CARE_PROVIDER_SITE_OTHER): Payer: 59 | Admitting: Certified Nurse Midwife

## 2017-12-12 VITALS — BP 107/75 | HR 76 | Wt 127.0 lb

## 2017-12-12 DIAGNOSIS — Z3400 Encounter for supervision of normal first pregnancy, unspecified trimester: Secondary | ICD-10-CM

## 2017-12-12 DIAGNOSIS — R309 Painful micturition, unspecified: Secondary | ICD-10-CM | POA: Diagnosis not present

## 2017-12-12 LAB — POCT URINALYSIS DIPSTICK OB
BILIRUBIN UA: NEGATIVE
GLUCOSE, UA: NEGATIVE
KETONES UA: NEGATIVE
Leukocytes, UA: NEGATIVE
Nitrite, UA: NEGATIVE
POC,PROTEIN,UA: NEGATIVE
Spec Grav, UA: 1.01 (ref 1.010–1.025)
UROBILINOGEN UA: 0.2 U/dL
pH, UA: 6.5 (ref 5.0–8.0)

## 2017-12-12 NOTE — Addendum Note (Signed)
Addended by: Granville Lewis on: 12/12/2017 09:35 AM   Modules accepted: Orders

## 2017-12-12 NOTE — Progress Notes (Signed)
Subjective:  Jordan [name] is a 27 y.o. G1P0000 at [redacted]w[redacted]d being seen today for ongoing prenatal care.  She is currently monitored for the following issues for this low-risk pregnancy and has Ophthalmoplegic migraine, not intractable; Supervision of normal first pregnancy, antepartum; B12 deficiency anemia; and Vitamin D deficiency on their problem list.  Patient reports urinary frequency. Denies dysuria, urgency, hematuria, fever, or back pain.  Contractions: Not present. Vag. Bleeding: None.  Movement: Present. Denies leaking of fluid.   The following portions of the patient's history were reviewed and updated as appropriate: allergies, current medications, past family history, past medical history, past social history, past surgical history and problem list. Problem list updated.  Objective:   Vitals:   12/12/17 0836  BP: 107/75  Pulse: 76  Weight: 57.6 kg    Fetal Status: Fetal Heart Rate (bpm): 152 Fundal Height: 24 cm Movement: Present  Presentation: Homero Fellers Breech  General:  Alert, oriented and cooperative. Patient is in no acute distress.  Skin: Skin is warm and dry. No rash noted.   Cardiovascular: Normal heart rate noted  Respiratory: Normal respiratory effort, no problems with respiration noted  Abdomen: Soft, gravid, appropriate for gestational age. Pain/Pressure: Absent     Pelvic: Vag. Bleeding: None Vag D/C Character: Thin   Cervical exam deferred        Extremities: Normal range of motion.  Edema: None  Mental Status: Normal mood and affect. Normal behavior. Normal judgment and thought content.   Urinalysis:      Assessment and Plan:  Pregnancy: G1P0000 at [redacted]w[redacted]d  1. Supervision of normal first pregnancy, antepartum -GTT next visit  2. Urinary pain -UA and UC  Preterm labor symptoms and general obstetric precautions including but not limited to vaginal bleeding, contractions, leaking of fluid and fetal movement were reviewed in detail with the patient. Please  refer to After Visit Summary for other counseling recommendations.  Return in about 4 weeks (around 01/09/2018).   Donette Larry, CNM

## 2017-12-12 NOTE — Progress Notes (Signed)
Pt c/o urinary pain and frequency even having voiding.

## 2017-12-12 NOTE — Patient Instructions (Signed)

## 2017-12-14 LAB — CULTURE, OB URINE

## 2017-12-14 LAB — URINE CULTURE, OB REFLEX

## 2017-12-21 ENCOUNTER — Ambulatory Visit: Payer: 59 | Admitting: Adult Health

## 2017-12-21 ENCOUNTER — Encounter: Payer: Self-pay | Admitting: Adult Health

## 2017-12-21 VITALS — BP 100/66 | HR 77 | Ht 59.0 in | Wt 130.4 lb

## 2017-12-21 DIAGNOSIS — Z3A25 25 weeks gestation of pregnancy: Secondary | ICD-10-CM

## 2017-12-21 DIAGNOSIS — G43119 Migraine with aura, intractable, without status migrainosus: Secondary | ICD-10-CM | POA: Diagnosis not present

## 2017-12-21 NOTE — Patient Instructions (Signed)
Your Plan:  Continue to monitor symptoms If your symptoms worsen or you develop new symptoms please let us know.   Thank you for coming to see us at [facility]. I hope we have been able to provide you high quality care today.  You may receive a patient satisfaction survey over the next few weeks. We would appreciate your feedback and comments so that we may continue to improve ourselves and the health of our patients.   

## 2017-12-21 NOTE — Progress Notes (Signed)
I have read the note, and I agree with the clinical assessment and plan.  Nihar Klus K Anuoluwapo Mefferd   

## 2017-12-21 NOTE — Progress Notes (Signed)
PATIENT: Jordan [name] DOB: 1991/02/17  REASON FOR VISIT: follow up HISTORY FROM: patient  HISTORY OF PRESENT ILLNESS: Today 12/21/17:  Jordan [name] is a 27 year old female with a history of migraine headaches.  She returns today for follow-up.  She reports that she is [redacted] weeks pregnant.  She has stopped her migraine medication.  She reports that her headaches have actually improved.  She has approximately 1-2 headaches a month.  They continue to occur on the left side.  She does have photophobia, phonophobia and nausea.  She is been taking Tylenol for this severe headaches.  Overall she feels that her headaches are manageable at this time.  She returns today for evaluation  HISTORY 06/15/17  Jordan [name] is a 27 year old female with a history of migraine headaches.  She returns today for follow-up.  She states that nortriptyline has been beneficial for her headaches.  She states that there much less.  She has 6-8 headaches a month but the severity is not as bad as it was.  She states that the headache can occur on the left side of the head.  She does have photophobia, phonophobia and nausea.  She states that she can take Maxalt and the headache will resolve within an hour.  She does report that sometimes the Maxalt makes her sleepy but she can tolerate this much better than Imitrex.  She states that nortriptyline does cause drowsiness and constipation.  She is trying to manage the constipation with her diet.  But she does admit that sometimes she has had to use medication.  The patient still reports that in the near future she may consider pregnancy.  She returns today for an evaluation.   REVIEW OF SYSTEMS: Out of a complete 14 system review of symptoms, the patient complains only of the following symptoms, and all other reviewed systems are negative.  See HPI  ALLERGIES: Allergies  Allergen Reactions  . Latex Rash and Other (See Comments)    Not documented in historical      HOME MEDICATIONS: Outpatient Medications Prior to Visit  Medication Sig Dispense Refill  . Prenatal Vit-Fe Fumarate-FA (PRENATAL PO) Take 1 Dose by mouth daily.     No facility-administered medications prior to visit.     PAST MEDICAL HISTORY: Past Medical History:  Diagnosis Date  . Migraines     PAST SURGICAL HISTORY: Past Surgical History:  Procedure Laterality Date  . WISDOM TOOTH EXTRACTION  May 2012    FAMILY HISTORY: Family History  Problem Relation Age of Onset  . Hypertension Father   . Hyperlipidemia Father   . Cancer Maternal Grandmother   . Cancer Maternal Grandfather     SOCIAL HISTORY: Social History   Socioeconomic History  . Marital status: Married    Spouse name: Not on file  . Number of children: 0  . Years of education: BSN  . Highest education level: Not on file  Occupational History  . 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 Financial resource strain: Not on file  . Food insecurity:    Worry: Not on file    Inability: Not on file  . Transportation needs:    Medical: Not on file    Non-medical: Not on file  Tobacco Use  . Smoking status: Never Smoker  . Smokeless tobacco: Never Used  Substance and Sexual Activity  . Alcohol use: Not Currently  . Drug use: No  . Sexual activity: Yes    Birth control/protection: None  Lifestyle  . Physical activity:    Days per week: Not on file    Minutes per session: Not on file  . Stress: Not on file  Relationships  . Social connections:    Talks on phone: Not on file    Gets together: Not on file    Attends religious service: Not on file    Active member of club or organization: Not on file    Attends meetings of clubs or organizations: Not on file    Relationship status: Not on file  . Intimate partner violence:    Fear of current or ex partner: Not on file    Emotionally abused: Not on file    Physically abused: Not on file    Forced sexual activity: Not on file  Other  Topics Concern  . Not on file  Social History Narrative   Nurse at [employer]   Lives with roommate   Caffeine use: 3-4 times per week Neita Carp)   Right handed      PHYSICAL EXAM  Vitals:   12/21/17 0912  BP: 100/66  Pulse: 77  Weight: 130 lb 6.4 oz (59.1 kg)  Height: 4\' 11"  (1.499 m)   Body mass index is 26.34 kg/m.  Generalized: Well developed, in no acute distress   Neurological examination  Mentation: Alert oriented to time, place, history taking. Follows all commands speech and language fluent Cranial nerve II-XII: Pupils were equal round reactive to light. Extraocular movements were full, visual field were full on confrontational test. Facial sensation and strength were normal. Uvula tongue midline. Head turning and shoulder shrug  were normal and symmetric. Motor: The motor testing reveals 5 over 5 strength of all 4 extremities. Good symmetric motor tone is noted throughout.  Sensory: Sensory testing is intact to soft touch on all 4 extremities. No evidence of extinction is noted.  Coordination: Cerebellar testing reveals good finger-nose-finger and heel-to-shin bilaterally.  Gait and station: Gait is normal.  Reflexes: Deep tendon reflexes are symmetric and normal bilaterally.   DIAGNOSTIC DATA (LABS, IMAGING, TESTING) - I reviewed patient records, labs, notes, testing and imaging myself where available.  Lab Results  Component Value Date   WBC 6.2 08/08/2017   HGB 11.5 08/08/2017   HCT 34.1 08/08/2017   MCV 95 08/08/2017   PLT 220 08/08/2017      Component Value Date/Time   NA 137 05/11/2017 1844   K 4.0 05/11/2017 1844   CL 102 05/11/2017 1844   CO2 26 05/11/2017 1844   GLUCOSE 97 05/11/2017 1844   BUN 11 05/11/2017 1844   CREATININE 0.76 05/11/2017 1844   CALCIUM 8.7 (L) 05/11/2017 1844   GFRNONAA >60 05/11/2017 1844   GFRAA >60 05/11/2017 1844    Lab Results  Component Value Date   TSH 3.71 12/23/2015      ASSESSMENT AND PLAN 27 y.o. year  old female  has a past medical history of Migraines. here with:  1.  Migraine headaches 2.  Pregnancy  Overall the patient is doing well.  She is not on any medication currently for her headaches.  We will continue to monitor.  She is advised that if her headache frequency or severity increases she should let us know.  She will follow-up in 6 months or sooner if needed.   I spent 15 minutes with the patient. 50% of this time was spent reviewing the patient's symptoms.   Butch Penny, MSN, NP-C 12/21/2017, 9:36 AM [facility] [address] [place], [place] [address] [phone]

## 2017-12-23 DIAGNOSIS — M5489 Other dorsalgia: Secondary | ICD-10-CM | POA: Diagnosis not present

## 2018-01-02 DIAGNOSIS — O9989 Other specified diseases and conditions complicating pregnancy, childbirth and the puerperium: Secondary | ICD-10-CM | POA: Diagnosis not present

## 2018-01-10 ENCOUNTER — Ambulatory Visit (INDEPENDENT_AMBULATORY_CARE_PROVIDER_SITE_OTHER): Payer: 59 | Admitting: Advanced Practice Midwife

## 2018-01-10 VITALS — BP 122/79 | HR 99 | Wt 136.0 lb

## 2018-01-10 DIAGNOSIS — Z23 Encounter for immunization: Secondary | ICD-10-CM | POA: Diagnosis not present

## 2018-01-10 DIAGNOSIS — Z3400 Encounter for supervision of normal first pregnancy, unspecified trimester: Secondary | ICD-10-CM

## 2018-01-10 DIAGNOSIS — Z3403 Encounter for supervision of normal first pregnancy, third trimester: Secondary | ICD-10-CM | POA: Diagnosis not present

## 2018-01-10 NOTE — Progress Notes (Signed)
   PRENATAL VISIT NOTE  Subjective:  Jordan [name] is a 27 y.o. G1P0000 at [redacted]w[redacted]d being seen today for ongoing prenatal care.  She is currently monitored for the following issues for this low-risk pregnancy and has Ophthalmoplegic migraine, not intractable; Supervision of normal first pregnancy, antepartum; B12 deficiency anemia; and Vitamin D deficiency on their problem list.  Patient reports no complaints.  Contractions: Not present. Vag. Bleeding: None.  Movement: Present. Denies leaking of fluid.   The following portions of the patient's history were reviewed and updated as appropriate: allergies, current medications, past family history, past medical history, past social history, past surgical history and problem list. Problem list updated.  Objective:   Vitals:   01/10/18 0819  BP: 122/79  Pulse: 99  Weight: 61.7 kg    Fetal Status: Fetal Heart Rate (bpm): 144   Movement: Present     General:  Alert, oriented and cooperative. Patient is in no acute distress.  Skin: Skin is warm and dry. No rash noted.   Cardiovascular: Normal heart rate noted  Respiratory: Normal respiratory effort, no problems with respiration noted  Abdomen: Soft, gravid, appropriate for gestational age.  Pain/Pressure: Absent     Pelvic: Cervical exam deferred        Extremities: Normal range of motion.  Edema: None  Mental Status: Normal mood and affect. Normal behavior. Normal judgment and thought content.   Assessment and Plan:  Pregnancy: G1P0000 at [redacted]w[redacted]d  1. Encounter for supervision of normal first pregnancy in third trimester --Anticipatory guidance about next visits/weeks of pregnancy given. --Questions answered. Pt prefers midwives. Discussed birth plans today. Recommend she bring any written birth plan to review with midwife by 34 weeks.  - 2Hr GTT w/ 1 Hr Carpenter 75 g - HIV antibody (with reflex) - CBC - RPR - Tdap vaccine greater than or equal to 7yo IM    Preterm labor symptoms  and general obstetric precautions including but not limited to vaginal bleeding, contractions, leaking of fluid and fetal movement were reviewed in detail with the patient. Please refer to After Visit Summary for other counseling recommendations.  No follow-ups on file.  Future Appointments  Date Time Provider Department Center  06/23/2018  8:00 AM York Spaniel, MD [facility] None    Sharen Counter, CNM

## 2018-01-10 NOTE — Patient Instructions (Signed)
Third Trimester of Pregnancy The third trimester is from week 28 through week 40 (months 7 through 9). The third trimester is a time when the unborn baby (fetus) is growing rapidly. At the end of the ninth month, the fetus is about 20 inches in length and weighs 6-10 pounds. Body changes during your third trimester Your body will continue to go through many changes during pregnancy. The changes vary from woman to woman. During the third trimester:  Your weight will continue to increase. You can expect to gain 25-35 pounds (11-16 kg) by the end of the pregnancy.  You may begin to get stretch marks on your hips, abdomen, and breasts.  You may urinate more often because the fetus is moving lower into your pelvis and pressing on your bladder.  You may develop or continue to have heartburn. This is caused by increased hormones that slow down muscles in the digestive tract.  You may develop or continue to have constipation because increased hormones slow digestion and cause the muscles that push waste through your intestines to relax.  You may develop hemorrhoids. These are swollen veins (varicose veins) in the rectum that can itch or be painful.  You may develop swollen, bulging veins (varicose veins) in your legs.  You may have increased body aches in the pelvis, back, or thighs. This is due to weight gain and increased hormones that are relaxing your joints.  You may have changes in your hair. These can include thickening of your hair, rapid growth, and changes in texture. Some women also have hair loss during or after pregnancy, or hair that feels dry or thin. Your hair will most likely return to normal after your baby is born.  Your breasts will continue to grow and they will continue to become tender. A yellow fluid (colostrum) may leak from your breasts. This is the first milk you are producing for your baby.  Your belly button may stick out.  You may notice more swelling in your hands,  face, or ankles.  You may have increased tingling or numbness in your hands, arms, and legs. The skin on your belly may also feel numb.  You may feel short of breath because of your expanding uterus.  You may have more problems sleeping. This can be caused by the size of your belly, increased need to urinate, and an increase in your body's metabolism.  You may notice the fetus "dropping," or moving lower in your abdomen (lightening).  You may have increased vaginal discharge.  You may notice your joints feel loose and you may have pain around your pelvic bone.  What to expect at prenatal visits You will have prenatal exams every 2 weeks until week 36. Then you will have weekly prenatal exams. During a routine prenatal visit:  You will be weighed to make sure you and the baby are growing normally.  Your blood pressure will be taken.  Your abdomen will be measured to track your baby's growth.  The fetal heartbeat will be listened to.  Any test results from the previous visit will be discussed.  You may have a cervical check near your due date to see if your cervix has softened or thinned (effaced).  You will be tested for Group B streptococcus. This happens between 35 and 37 weeks.  Your health care provider may ask you:  What your birth plan is.  How you are feeling.  If you are feeling the baby move.  If you have had   any abnormal symptoms, such as leaking fluid, bleeding, severe headaches, or abdominal cramping.  If you are using any tobacco products, including cigarettes, chewing tobacco, and electronic cigarettes.  If you have any questions.  Other tests or screenings that may be performed during your third trimester include:  Blood tests that check for low iron levels (anemia).  Fetal testing to check the health, activity level, and growth of the fetus. Testing is done if you have certain medical conditions or if there are problems during the  pregnancy.  Nonstress test (NST). This test checks the health of your baby to make sure there are no signs of problems, such as the baby not getting enough oxygen. During this test, a belt is placed around your belly. The baby is made to move, and its heart rate is monitored during movement.  What is false labor? False labor is a condition in which you feel small, irregular tightenings of the muscles in the womb (contractions) that usually go away with rest, changing position, or drinking water. These are called Braxton Hicks contractions. Contractions may last for hours, days, or even weeks before true labor sets in. If contractions come at regular intervals, become more frequent, increase in intensity, or become painful, you should see your health care provider. What are the signs of labor?  Abdominal cramps.  Regular contractions that start at 10 minutes apart and become stronger and more frequent with time.  Contractions that start on the top of the uterus and spread down to the lower abdomen and back.  Increased pelvic pressure and dull back pain.  A watery or bloody mucus discharge that comes from the vagina.  Leaking of amniotic fluid. This is also known as your "water breaking." It could be a slow trickle or a gush. Let your health care provider know if it has a color or strange odor. If you have any of these signs, call your health care provider right away, even if it is before your due date. Follow these instructions at home: Medicines  Follow your health care provider's instructions regarding medicine use. Specific medicines may be either safe or unsafe to take during pregnancy.  Take a prenatal vitamin that contains at least 600 micrograms (mcg) of folic acid.  If you develop constipation, try taking a stool softener if your health care provider approves. Eating and drinking  Eat a balanced diet that includes fresh fruits and vegetables, whole grains, good sources of protein  such as meat, eggs, or tofu, and low-fat dairy. Your health care provider will help you determine the amount of weight gain that is right for you.  Avoid raw meat and uncooked cheese. These carry germs that can cause birth defects in the baby.  If you have low calcium intake from food, talk to your health care provider about whether you should take a daily calcium supplement.  Eat four or five small meals rather than three large meals a day.  Limit foods that are high in fat and processed sugars, such as fried and sweet foods.  To prevent constipation: ? Drink enough fluid to keep your urine clear or pale yellow. ? Eat foods that are high in fiber, such as fresh fruits and vegetables, whole grains, and beans. Activity  Exercise only as directed by your health care provider. Most women can continue their usual exercise routine during pregnancy. Try to exercise for 30 minutes at least 5 days a week. Stop exercising if you experience uterine contractions.  Avoid heavy   lifting.  Do not exercise in extreme heat or humidity, or at high altitudes.  Wear low-heel, comfortable shoes.  Practice good posture.  You may continue to have sex unless your health care provider tells you otherwise. Relieving pain and discomfort  Take frequent breaks and rest with your legs elevated if you have leg cramps or low back pain.  Take warm sitz baths to soothe any pain or discomfort caused by hemorrhoids. Use hemorrhoid cream if your health care provider approves.  Wear a good support bra to prevent discomfort from breast tenderness.  If you develop varicose veins: ? Wear support pantyhose or compression stockings as told by your healthcare provider. ? Elevate your feet for 15 minutes, 3-4 times a day. Prenatal care  Write down your questions. Take them to your prenatal visits.  Keep all your prenatal visits as told by your health care provider. This is important. Safety  Wear your seat belt at  all times when driving.  Make a list of emergency phone numbers, including numbers for family, friends, the hospital, and police and fire departments. General instructions  Avoid cat litter boxes and soil used by cats. These carry germs that can cause birth defects in the baby. If you have a cat, ask someone to clean the litter box for you.  Do not travel far distances unless it is absolutely necessary and only with the approval of your health care provider.  Do not use hot tubs, steam rooms, or saunas.  Do not drink alcohol.  Do not use any products that contain nicotine or tobacco, such as cigarettes and e-cigarettes. If you need help quitting, ask your health care provider.  Do not use any medicinal herbs or unprescribed drugs. These chemicals affect the formation and growth of the baby.  Do not douche or use tampons or scented sanitary pads.  Do not cross your legs for long periods of time.  To prepare for the arrival of your baby: ? Take prenatal classes to understand, practice, and ask questions about labor and delivery. ? Make a trial run to the hospital. ? Visit the hospital and tour the maternity area. ? Arrange for maternity or paternity leave through employers. ? Arrange for family and friends to take care of pets while you are in the hospital. ? Purchase a rear-facing car seat and make sure you know how to install it in your car. ? Pack your hospital bag. ? Prepare the baby's nursery. Make sure to remove all pillows and stuffed animals from the baby's crib to prevent suffocation.  Visit your dentist if you have not gone during your pregnancy. Use a soft toothbrush to brush your teeth and be gentle when you floss. Contact a health care provider if:  You are unsure if you are in labor or if your water has broken.  You become dizzy.  You have mild pelvic cramps, pelvic pressure, or nagging pain in your abdominal area.  You have lower back pain.  You have persistent  nausea, vomiting, or diarrhea.  You have an unusual or bad smelling vaginal discharge.  You have pain when you urinate. Get help right away if:  Your water breaks before 37 weeks.  You have regular contractions less than 5 minutes apart before 37 weeks.  You have a fever.  You are leaking fluid from your vagina.  You have spotting or bleeding from your vagina.  You have severe abdominal pain or cramping.  You have rapid weight loss or weight gain.    You have shortness of breath with chest pain.  You notice sudden or extreme swelling of your face, hands, ankles, feet, or legs.  Your baby makes fewer than 10 movements in 2 hours.  You have severe headaches that do not go away when you take medicine.  You have vision changes. Summary  The third trimester is from week 28 through week 40, months 7 through 9. The third trimester is a time when the unborn baby (fetus) is growing rapidly.  During the third trimester, your discomfort may increase as you and your baby continue to gain weight. You may have abdominal, leg, and back pain, sleeping problems, and an increased need to urinate.  During the third trimester your breasts will keep growing and they will continue to become tender. A yellow fluid (colostrum) may leak from your breasts. This is the first milk you are producing for your baby.  False labor is a condition in which you feel small, irregular tightenings of the muscles in the womb (contractions) that eventually go away. These are called Braxton Hicks contractions. Contractions may last for hours, days, or even weeks before true labor sets in.  Signs of labor can include: abdominal cramps; regular contractions that start at 10 minutes apart and become stronger and more frequent with time; watery or bloody mucus discharge that comes from the vagina; increased pelvic pressure and dull back pain; and leaking of amniotic fluid. This information is not intended to replace advice  given to you by your health care provider. Make sure you discuss any questions you have with your health care provider. Document Released: 03/16/2001 Document Revised: 08/28/2015 Document Reviewed: 05/23/2012 Elsevier Interactive Patient Education  2017 [employer]  

## 2018-01-11 LAB — 2HR GTT W 1 HR, CARPENTER, 75 G
GLUCOSE, 2 HR, GEST: 97 mg/dL (ref 65–152)
Glucose, 1 Hr, Gest: 105 mg/dL (ref 65–179)
Glucose, Fasting, Gest: 70 mg/dL (ref 65–91)

## 2018-01-11 LAB — CBC
HCT: 32.9 % — ABNORMAL LOW (ref 35.0–45.0)
Hemoglobin: 11.4 g/dL — ABNORMAL LOW (ref 11.7–15.5)
MCH: 33.5 pg — ABNORMAL HIGH (ref 27.0–33.0)
MCHC: 34.7 g/dL (ref 32.0–36.0)
MCV: 96.8 fL (ref 80.0–100.0)
MPV: 10.7 fL (ref 7.5–12.5)
PLATELETS: 214 10*3/uL (ref 140–400)
RBC: 3.4 10*6/uL — ABNORMAL LOW (ref 3.80–5.10)
RDW: 12.1 % (ref 11.0–15.0)
WBC: 10.9 10*3/uL — ABNORMAL HIGH (ref 3.8–10.8)

## 2018-01-11 LAB — RPR: RPR Ser Ql: NONREACTIVE

## 2018-01-11 LAB — HIV ANTIBODY (ROUTINE TESTING W REFLEX): HIV: NONREACTIVE

## 2018-01-24 ENCOUNTER — Ambulatory Visit (INDEPENDENT_AMBULATORY_CARE_PROVIDER_SITE_OTHER): Payer: 59 | Admitting: Obstetrics and Gynecology

## 2018-01-24 VITALS — BP 120/76 | HR 100 | Wt 140.0 lb

## 2018-01-24 DIAGNOSIS — Z3400 Encounter for supervision of normal first pregnancy, unspecified trimester: Secondary | ICD-10-CM

## 2018-01-24 NOTE — Progress Notes (Signed)
   PRENATAL VISIT NOTE  Subjective:  Jordan [name] is a 27 y.o. G1P0000 at [redacted]w[redacted]d being seen today for ongoing prenatal care.  She is currently monitored for the following issues for this low-risk pregnancy and has Ophthalmoplegic migraine, not intractable; Supervision of normal first pregnancy, antepartum; B12 deficiency anemia; and Vitamin D deficiency on their problem list.  Patient reports no complaints. Swelling in hands and feet at times.  Contractions: Not present. Vag. Bleeding: None.  Movement: Present. Denies leaking of fluid.   The following portions of the patient's history were reviewed and updated as appropriate: allergies, current medications, past family history, past medical history, past social history, past surgical history and problem list. Problem list updated.  Objective:   Vitals:   01/24/18 0848  BP: 120/76  Pulse: 100  Weight: 140 lb (63.5 kg)    Fetal Status: Fetal Heart Rate (bpm): 130   Movement: Present     General:  Alert, oriented and cooperative. Patient is in no acute distress.  Skin: Skin is warm and dry. No rash noted.   Cardiovascular: Normal heart rate noted  Respiratory: Normal respiratory effort, no problems with respiration noted  Abdomen: Soft, gravid, appropriate for gestational age.  Pain/Pressure: Absent     Pelvic: Cervical exam deferred        Extremities: Normal range of motion.  Edema: Trace  Mental Status: Normal mood and affect. Normal behavior. Normal judgment and thought content.   Assessment and Plan:  Pregnancy: G1P0000 at [redacted]w[redacted]d  1. Supervision of normal first pregnancy, antepartum  Discussed ted hose while at work. 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 This may help with swelling. BP good today. Call the office if symptoms worsen   There are no diagnoses linked to this encounter. Preterm labor symptoms and general obstetric precautions including but not limited to vaginal bleeding, contractions, leaking of  fluid and fetal movement were reviewed in detail with the patient. Please refer to After Visit Summary for other counseling recommendations.  Return in about 2 weeks (around 02/07/2018).  Future Appointments  Date Time Provider Department Center  02/07/2018  8:15 AM Hurshel Party, CNM [facility]  06/23/2018  8:00 AM York Spaniel, MD [facility] None    Venia Carbon, NP

## 2018-02-07 ENCOUNTER — Ambulatory Visit (INDEPENDENT_AMBULATORY_CARE_PROVIDER_SITE_OTHER): Payer: 59 | Admitting: Advanced Practice Midwife

## 2018-02-07 VITALS — BP 114/70 | HR 84 | Wt 145.0 lb

## 2018-02-07 DIAGNOSIS — Z3400 Encounter for supervision of normal first pregnancy, unspecified trimester: Secondary | ICD-10-CM

## 2018-02-07 NOTE — Patient Instructions (Signed)
Third Trimester of Pregnancy The third trimester is from week 28 through week 40 (months 7 through 9). The third trimester is a time when the unborn baby (fetus) is growing rapidly. At the end of the ninth month, the fetus is about 20 inches in length and weighs 6-10 pounds. Body changes during your third trimester Your body will continue to go through many changes during pregnancy. The changes vary from woman to woman. During the third trimester:  Your weight will continue to increase. You can expect to gain 25-35 pounds (11-16 kg) by the end of the pregnancy.  You may begin to get stretch marks on your hips, abdomen, and breasts.  You may urinate more often because the fetus is moving lower into your pelvis and pressing on your bladder.  You may develop or continue to have heartburn. This is caused by increased hormones that slow down muscles in the digestive tract.  You may develop or continue to have constipation because increased hormones slow digestion and cause the muscles that push waste through your intestines to relax.  You may develop hemorrhoids. These are swollen veins (varicose veins) in the rectum that can itch or be painful.  You may develop swollen, bulging veins (varicose veins) in your legs.  You may have increased body aches in the pelvis, back, or thighs. This is due to weight gain and increased hormones that are relaxing your joints.  You may have changes in your hair. These can include thickening of your hair, rapid growth, and changes in texture. Some women also have hair loss during or after pregnancy, or hair that feels dry or thin. Your hair will most likely return to normal after your baby is born.  Your breasts will continue to grow and they will continue to become tender. A yellow fluid (colostrum) may leak from your breasts. This is the first milk you are producing for your baby.  Your belly button may stick out.  You may notice more swelling in your hands,  face, or ankles.  You may have increased tingling or numbness in your hands, arms, and legs. The skin on your belly may also feel numb.  You may feel short of breath because of your expanding uterus.  You may have more problems sleeping. This can be caused by the size of your belly, increased need to urinate, and an increase in your body's metabolism.  You may notice the fetus "dropping," or moving lower in your abdomen (lightening).  You may have increased vaginal discharge.  You may notice your joints feel loose and you may have pain around your pelvic bone.  What to expect at prenatal visits You will have prenatal exams every 2 weeks until week 36. Then you will have weekly prenatal exams. During a routine prenatal visit:  You will be weighed to make sure you and the baby are growing normally.  Your blood pressure will be taken.  Your abdomen will be measured to track your baby's growth.  The fetal heartbeat will be listened to.  Any test results from the previous visit will be discussed.  You may have a cervical check near your due date to see if your cervix has softened or thinned (effaced).  You will be tested for Group B streptococcus. This happens between 35 and 37 weeks.  Your health care provider may ask you:  What your birth plan is.  How you are feeling.  If you are feeling the baby move.  If you have had   any abnormal symptoms, such as leaking fluid, bleeding, severe headaches, or abdominal cramping.  If you are using any tobacco products, including cigarettes, chewing tobacco, and electronic cigarettes.  If you have any questions.  Other tests or screenings that may be performed during your third trimester include:  Blood tests that check for low iron levels (anemia).  Fetal testing to check the health, activity level, and growth of the fetus. Testing is done if you have certain medical conditions or if there are problems during the  pregnancy.  Nonstress test (NST). This test checks the health of your baby to make sure there are no signs of problems, such as the baby not getting enough oxygen. During this test, a belt is placed around your belly. The baby is made to move, and its heart rate is monitored during movement.  What is false labor? False labor is a condition in which you feel small, irregular tightenings of the muscles in the womb (contractions) that usually go away with rest, changing position, or drinking water. These are called Braxton Hicks contractions. Contractions may last for hours, days, or even weeks before true labor sets in. If contractions come at regular intervals, become more frequent, increase in intensity, or become painful, you should see your health care provider. What are the signs of labor?  Abdominal cramps.  Regular contractions that start at 10 minutes apart and become stronger and more frequent with time.  Contractions that start on the top of the uterus and spread down to the lower abdomen and back.  Increased pelvic pressure and dull back pain.  A watery or bloody mucus discharge that comes from the vagina.  Leaking of amniotic fluid. This is also known as your "water breaking." It could be a slow trickle or a gush. Let your health care provider know if it has a color or strange odor. If you have any of these signs, call your health care provider right away, even if it is before your due date. Follow these instructions at home: Medicines  Follow your health care provider's instructions regarding medicine use. Specific medicines may be either safe or unsafe to take during pregnancy.  Take a prenatal vitamin that contains at least 600 micrograms (mcg) of folic acid.  If you develop constipation, try taking a stool softener if your health care provider approves. Eating and drinking  Eat a balanced diet that includes fresh fruits and vegetables, whole grains, good sources of protein  such as meat, eggs, or tofu, and low-fat dairy. Your health care provider will help you determine the amount of weight gain that is right for you.  Avoid raw meat and uncooked cheese. These carry germs that can cause birth defects in the baby.  If you have low calcium intake from food, talk to your health care provider about whether you should take a daily calcium supplement.  Eat four or five small meals rather than three large meals a day.  Limit foods that are high in fat and processed sugars, such as fried and sweet foods.  To prevent constipation: ? Drink enough fluid to keep your urine clear or pale yellow. ? Eat foods that are high in fiber, such as fresh fruits and vegetables, whole grains, and beans. Activity  Exercise only as directed by your health care provider. Most women can continue their usual exercise routine during pregnancy. Try to exercise for 30 minutes at least 5 days a week. Stop exercising if you experience uterine contractions.  Avoid heavy   lifting.  Do not exercise in extreme heat or humidity, or at high altitudes.  Wear low-heel, comfortable shoes.  Practice good posture.  You may continue to have sex unless your health care provider tells you otherwise. Relieving pain and discomfort  Take frequent breaks and rest with your legs elevated if you have leg cramps or low back pain.  Take warm sitz baths to soothe any pain or discomfort caused by hemorrhoids. Use hemorrhoid cream if your health care provider approves.  Wear a good support bra to prevent discomfort from breast tenderness.  If you develop varicose veins: ? Wear support pantyhose or compression stockings as told by your healthcare provider. ? Elevate your feet for 15 minutes, 3-4 times a day. Prenatal care  Write down your questions. Take them to your prenatal visits.  Keep all your prenatal visits as told by your health care provider. This is important. Safety  Wear your seat belt at  all times when driving.  Make a list of emergency phone numbers, including numbers for family, friends, the hospital, and police and fire departments. General instructions  Avoid cat litter boxes and soil used by cats. These carry germs that can cause birth defects in the baby. If you have a cat, ask someone to clean the litter box for you.  Do not travel far distances unless it is absolutely necessary and only with the approval of your health care provider.  Do not use hot tubs, steam rooms, or saunas.  Do not drink alcohol.  Do not use any products that contain nicotine or tobacco, such as cigarettes and e-cigarettes. If you need help quitting, ask your health care provider.  Do not use any medicinal herbs or unprescribed drugs. These chemicals affect the formation and growth of the baby.  Do not douche or use tampons or scented sanitary pads.  Do not cross your legs for long periods of time.  To prepare for the arrival of your baby: ? Take prenatal classes to understand, practice, and ask questions about labor and delivery. ? Make a trial run to the hospital. ? Visit the hospital and tour the maternity area. ? Arrange for maternity or paternity leave through employers. ? Arrange for family and friends to take care of pets while you are in the hospital. ? Purchase a rear-facing car seat and make sure you know how to install it in your car. ? Pack your hospital bag. ? Prepare the baby's nursery. Make sure to remove all pillows and stuffed animals from the baby's crib to prevent suffocation.  Visit your dentist if you have not gone during your pregnancy. Use a soft toothbrush to brush your teeth and be gentle when you floss. Contact a health care provider if:  You are unsure if you are in labor or if your water has broken.  You become dizzy.  You have mild pelvic cramps, pelvic pressure, or nagging pain in your abdominal area.  You have lower back pain.  You have persistent  nausea, vomiting, or diarrhea.  You have an unusual or bad smelling vaginal discharge.  You have pain when you urinate. Get help right away if:  Your water breaks before 37 weeks.  You have regular contractions less than 5 minutes apart before 37 weeks.  You have a fever.  You are leaking fluid from your vagina.  You have spotting or bleeding from your vagina.  You have severe abdominal pain or cramping.  You have rapid weight loss or weight gain.    You have shortness of breath with chest pain.  You notice sudden or extreme swelling of your face, hands, ankles, feet, or legs.  Your baby makes fewer than 10 movements in 2 hours.  You have severe headaches that do not go away when you take medicine.  You have vision changes. Summary  The third trimester is from week 28 through week 40, months 7 through 9. The third trimester is a time when the unborn baby (fetus) is growing rapidly.  During the third trimester, your discomfort may increase as you and your baby continue to gain weight. You may have abdominal, leg, and back pain, sleeping problems, and an increased need to urinate.  During the third trimester your breasts will keep growing and they will continue to become tender. A yellow fluid (colostrum) may leak from your breasts. This is the first milk you are producing for your baby.  False labor is a condition in which you feel small, irregular tightenings of the muscles in the womb (contractions) that eventually go away. These are called Braxton Hicks contractions. Contractions may last for hours, days, or even weeks before true labor sets in.  Signs of labor can include: abdominal cramps; regular contractions that start at 10 minutes apart and become stronger and more frequent with time; watery or bloody mucus discharge that comes from the vagina; increased pelvic pressure and dull back pain; and leaking of amniotic fluid. This information is not intended to replace advice  given to you by your health care provider. Make sure you discuss any questions you have with your health care provider. Document Released: 03/16/2001 Document Revised: 08/28/2015 Document Reviewed: 05/23/2012 Elsevier Interactive Patient Education  2017 [employer]  

## 2018-02-07 NOTE — Progress Notes (Signed)
   PRENATAL VISIT NOTE  Subjective:  Jordan [name] is a 27 y.o. G1P0000 at [redacted]w[redacted]d being seen today for ongoing prenatal care.  She is currently monitored for the following issues for this low-risk pregnancy and has Ophthalmoplegic migraine, not intractable; Supervision of normal first pregnancy, antepartum; B12 deficiency anemia; and Vitamin D deficiency on their problem list.  Patient reports no complaints.  Contractions: Not present. Vag. Bleeding: None.  Movement: Present. Denies leaking of fluid.   The following portions of the patient's history were reviewed and updated as appropriate: allergies, current medications, past family history, past medical history, past social history, past surgical history and problem list. Problem list updated.  Objective:   Vitals:   02/07/18 0809  BP: 114/70  Pulse: 84  Weight: 65.8 kg    Fetal Status: Fetal Heart Rate (bpm): 143   Movement: Present     General:  Alert, oriented and cooperative. Patient is in no acute distress.  Skin: Skin is warm and dry. No rash noted.   Cardiovascular: Normal heart rate noted  Respiratory: Normal respiratory effort, no problems with respiration noted  Abdomen: Soft, gravid, appropriate for gestational age.  Pain/Pressure: Absent     Pelvic: Cervical exam deferred        Extremities: Normal range of motion.  Edema: None  Mental Status: Normal mood and affect. Normal behavior. Normal judgment and thought content.   Assessment and Plan:  Pregnancy: G1P0000 at [redacted]w[redacted]d  1. Supervision of normal first pregnancy, antepartum --Anticipatory guidance about next visits/weeks of pregnancy given. --Baby is vertex today, bedside US when difficult to determine by [place].  Preterm labor symptoms and general obstetric precautions including but not limited to vaginal bleeding, contractions, leaking of fluid and fetal movement were reviewed in detail with the patient. Please refer to After Visit Summary for other counseling  recommendations.  Return in about 2 weeks (around 02/21/2018).  Future Appointments  Date Time Provider Department Center  06/23/2018  8:00 AM York Spaniel, MD [facility] None    Sharen Counter, CNM

## 2018-02-21 ENCOUNTER — Ambulatory Visit (INDEPENDENT_AMBULATORY_CARE_PROVIDER_SITE_OTHER): Payer: 59 | Admitting: Advanced Practice Midwife

## 2018-02-21 VITALS — BP 112/71 | HR 100 | Wt 145.0 lb

## 2018-02-21 DIAGNOSIS — Z3483 Encounter for supervision of other normal pregnancy, third trimester: Secondary | ICD-10-CM | POA: Diagnosis not present

## 2018-02-21 DIAGNOSIS — Z3482 Encounter for supervision of other normal pregnancy, second trimester: Secondary | ICD-10-CM | POA: Diagnosis not present

## 2018-02-21 DIAGNOSIS — Z3403 Encounter for supervision of normal first pregnancy, third trimester: Secondary | ICD-10-CM

## 2018-02-21 DIAGNOSIS — Z3400 Encounter for supervision of normal first pregnancy, unspecified trimester: Secondary | ICD-10-CM

## 2018-02-21 NOTE — Patient Instructions (Signed)
Third Trimester of Pregnancy The third trimester is from week 28 through week 40 (months 7 through 9). The third trimester is a time when the unborn baby (fetus) is growing rapidly. At the end of the ninth month, the fetus is about 20 inches in length and weighs 6-10 pounds. Body changes during your third trimester Your body will continue to go through many changes during pregnancy. The changes vary from woman to woman. During the third trimester:  Your weight will continue to increase. You can expect to gain 25-35 pounds (11-16 kg) by the end of the pregnancy.  You may begin to get stretch marks on your hips, abdomen, and breasts.  You may urinate more often because the fetus is moving lower into your pelvis and pressing on your bladder.  You may develop or continue to have heartburn. This is caused by increased hormones that slow down muscles in the digestive tract.  You may develop or continue to have constipation because increased hormones slow digestion and cause the muscles that push waste through your intestines to relax.  You may develop hemorrhoids. These are swollen veins (varicose veins) in the rectum that can itch or be painful.  You may develop swollen, bulging veins (varicose veins) in your legs.  You may have increased body aches in the pelvis, back, or thighs. This is due to weight gain and increased hormones that are relaxing your joints.  You may have changes in your hair. These can include thickening of your hair, rapid growth, and changes in texture. Some women also have hair loss during or after pregnancy, or hair that feels dry or thin. Your hair will most likely return to normal after your baby is born.  Your breasts will continue to grow and they will continue to become tender. A yellow fluid (colostrum) may leak from your breasts. This is the first milk you are producing for your baby.  Your belly button may stick out.  You may notice more swelling in your hands,  face, or ankles.  You may have increased tingling or numbness in your hands, arms, and legs. The skin on your belly may also feel numb.  You may feel short of breath because of your expanding uterus.  You may have more problems sleeping. This can be caused by the size of your belly, increased need to urinate, and an increase in your body's metabolism.  You may notice the fetus "dropping," or moving lower in your abdomen (lightening).  You may have increased vaginal discharge.  You may notice your joints feel loose and you may have pain around your pelvic bone.  What to expect at prenatal visits You will have prenatal exams every 2 weeks until week 36. Then you will have weekly prenatal exams. During a routine prenatal visit:  You will be weighed to make sure you and the baby are growing normally.  Your blood pressure will be taken.  Your abdomen will be measured to track your baby's growth.  The fetal heartbeat will be listened to.  Any test results from the previous visit will be discussed.  You may have a cervical check near your due date to see if your cervix has softened or thinned (effaced).  You will be tested for Group B streptococcus. This happens between 35 and 37 weeks.  Your health care provider may ask you:  What your birth plan is.  How you are feeling.  If you are feeling the baby move.  If you have had   any abnormal symptoms, such as leaking fluid, bleeding, severe headaches, or abdominal cramping.  If you are using any tobacco products, including cigarettes, chewing tobacco, and electronic cigarettes.  If you have any questions.  Other tests or screenings that may be performed during your third trimester include:  Blood tests that check for low iron levels (anemia).  Fetal testing to check the health, activity level, and growth of the fetus. Testing is done if you have certain medical conditions or if there are problems during the  pregnancy.  Nonstress test (NST). This test checks the health of your baby to make sure there are no signs of problems, such as the baby not getting enough oxygen. During this test, a belt is placed around your belly. The baby is made to move, and its heart rate is monitored during movement.  What is false labor? False labor is a condition in which you feel small, irregular tightenings of the muscles in the womb (contractions) that usually go away with rest, changing position, or drinking water. These are called Braxton Hicks contractions. Contractions may last for hours, days, or even weeks before true labor sets in. If contractions come at regular intervals, become more frequent, increase in intensity, or become painful, you should see your health care provider. What are the signs of labor?  Abdominal cramps.  Regular contractions that start at 10 minutes apart and become stronger and more frequent with time.  Contractions that start on the top of the uterus and spread down to the lower abdomen and back.  Increased pelvic pressure and dull back pain.  A watery or bloody mucus discharge that comes from the vagina.  Leaking of amniotic fluid. This is also known as your "water breaking." It could be a slow trickle or a gush. Let your health care provider know if it has a color or strange odor. If you have any of these signs, call your health care provider right away, even if it is before your due date. Follow these instructions at home: Medicines  Follow your health care provider's instructions regarding medicine use. Specific medicines may be either safe or unsafe to take during pregnancy.  Take a prenatal vitamin that contains at least 600 micrograms (mcg) of folic acid.  If you develop constipation, try taking a stool softener if your health care provider approves. Eating and drinking  Eat a balanced diet that includes fresh fruits and vegetables, whole grains, good sources of protein  such as meat, eggs, or tofu, and low-fat dairy. Your health care provider will help you determine the amount of weight gain that is right for you.  Avoid raw meat and uncooked cheese. These carry germs that can cause birth defects in the baby.  If you have low calcium intake from food, talk to your health care provider about whether you should take a daily calcium supplement.  Eat four or five small meals rather than three large meals a day.  Limit foods that are high in fat and processed sugars, such as fried and sweet foods.  To prevent constipation: ? Drink enough fluid to keep your urine clear or pale yellow. ? Eat foods that are high in fiber, such as fresh fruits and vegetables, whole grains, and beans. Activity  Exercise only as directed by your health care provider. Most women can continue their usual exercise routine during pregnancy. Try to exercise for 30 minutes at least 5 days a week. Stop exercising if you experience uterine contractions.  Avoid heavy   lifting.  Do not exercise in extreme heat or humidity, or at high altitudes.  Wear low-heel, comfortable shoes.  Practice good posture.  You may continue to have sex unless your health care provider tells you otherwise. Relieving pain and discomfort  Take frequent breaks and rest with your legs elevated if you have leg cramps or low back pain.  Take warm sitz baths to soothe any pain or discomfort caused by hemorrhoids. Use hemorrhoid cream if your health care provider approves.  Wear a good support bra to prevent discomfort from breast tenderness.  If you develop varicose veins: ? Wear support pantyhose or compression stockings as told by your healthcare provider. ? Elevate your feet for 15 minutes, 3-4 times a day. Prenatal care  Write down your questions. Take them to your prenatal visits.  Keep all your prenatal visits as told by your health care provider. This is important. Safety  Wear your seat belt at  all times when driving.  Make a list of emergency phone numbers, including numbers for family, friends, the hospital, and police and fire departments. General instructions  Avoid cat litter boxes and soil used by cats. These carry germs that can cause birth defects in the baby. If you have a cat, ask someone to clean the litter box for you.  Do not travel far distances unless it is absolutely necessary and only with the approval of your health care provider.  Do not use hot tubs, steam rooms, or saunas.  Do not drink alcohol.  Do not use any products that contain nicotine or tobacco, such as cigarettes and e-cigarettes. If you need help quitting, ask your health care provider.  Do not use any medicinal herbs or unprescribed drugs. These chemicals affect the formation and growth of the baby.  Do not douche or use tampons or scented sanitary pads.  Do not cross your legs for long periods of time.  To prepare for the arrival of your baby: ? Take prenatal classes to understand, practice, and ask questions about labor and delivery. ? Make a trial run to the hospital. ? Visit the hospital and tour the maternity area. ? Arrange for maternity or paternity leave through employers. ? Arrange for family and friends to take care of pets while you are in the hospital. ? Purchase a rear-facing car seat and make sure you know how to install it in your car. ? Pack your hospital bag. ? Prepare the baby's nursery. Make sure to remove all pillows and stuffed animals from the baby's crib to prevent suffocation.  Visit your dentist if you have not gone during your pregnancy. Use a soft toothbrush to brush your teeth and be gentle when you floss. Contact a health care provider if:  You are unsure if you are in labor or if your water has broken.  You become dizzy.  You have mild pelvic cramps, pelvic pressure, or nagging pain in your abdominal area.  You have lower back pain.  You have persistent  nausea, vomiting, or diarrhea.  You have an unusual or bad smelling vaginal discharge.  You have pain when you urinate. Get help right away if:  Your water breaks before 37 weeks.  You have regular contractions less than 5 minutes apart before 37 weeks.  You have a fever.  You are leaking fluid from your vagina.  You have spotting or bleeding from your vagina.  You have severe abdominal pain or cramping.  You have rapid weight loss or weight gain.    You have shortness of breath with chest pain.  You notice sudden or extreme swelling of your face, hands, ankles, feet, or legs.  Your baby makes fewer than 10 movements in 2 hours.  You have severe headaches that do not go away when you take medicine.  You have vision changes. Summary  The third trimester is from week 28 through week 40, months 7 through 9. The third trimester is a time when the unborn baby (fetus) is growing rapidly.  During the third trimester, your discomfort may increase as you and your baby continue to gain weight. You may have abdominal, leg, and back pain, sleeping problems, and an increased need to urinate.  During the third trimester your breasts will keep growing and they will continue to become tender. A yellow fluid (colostrum) may leak from your breasts. This is the first milk you are producing for your baby.  False labor is a condition in which you feel small, irregular tightenings of the muscles in the womb (contractions) that eventually go away. These are called Braxton Hicks contractions. Contractions may last for hours, days, or even weeks before true labor sets in.  Signs of labor can include: abdominal cramps; regular contractions that start at 10 minutes apart and become stronger and more frequent with time; watery or bloody mucus discharge that comes from the vagina; increased pelvic pressure and dull back pain; and leaking of amniotic fluid. This information is not intended to replace advice  given to you by your health care provider. Make sure you discuss any questions you have with your health care provider. Document Released: 03/16/2001 Document Revised: 08/28/2015 Document Reviewed: 05/23/2012 Elsevier Interactive Patient Education  2017 [employer]  

## 2018-02-21 NOTE — Progress Notes (Signed)
   PRENATAL VISIT NOTE  Subjective:  Jordan [name] is a 27 y.o. G1P0000 at 6165w1d being seen today for ongoing prenatal care.  She is currently monitored for the following issues for this low-risk pregnancy and has Ophthalmoplegic migraine, not intractable; Supervision of normal first pregnancy, antepartum; B12 deficiency anemia; and Vitamin D deficiency on their problem list.  Patient reports no complaints.  Contractions: Not present. Vag. Bleeding: None.  Movement: Present. Denies leaking of fluid.   The following portions of the patient's history were reviewed and updated as appropriate: allergies, current medications, past family history, past medical history, past social history, past surgical history and problem list. Problem list updated.  Objective:   Vitals:   02/21/18 0833  BP: 112/71  Pulse: 100  Weight: 65.8 kg    Fetal Status: Fetal Heart Rate (bpm): 137   Movement: Present     General:  Alert, oriented and cooperative. Patient is in no acute distress.  Skin: Skin is warm and dry. No rash noted.   Cardiovascular: Normal heart rate noted  Respiratory: Normal respiratory effort, no problems with respiration noted  Abdomen: Soft, gravid, appropriate for gestational age.  Pain/Pressure: Absent     Pelvic: Cervical exam deferred        Extremities: Normal range of motion.  Edema: None  Mental Status: Normal mood and affect. Normal behavior. Normal judgment and thought content.   Assessment and Plan:  Pregnancy: G1P0000 at 4365w1d  1. Supervision of normal first pregnancy, antepartum --Anticipatory guidance about next visits/weeks of pregnancy given. --Questions answered about birth plan, pt desires freedom of movement, intermittent monitoring.  I recommend concise written birth plan at future prenatal visit to go over with midwife and to put in chart. --Baby's position sometimes uncomfortable under ribs on right side. Discussed maternal positions to improve fetal  position.  Preterm labor symptoms and general obstetric precautions including but not limited to vaginal bleeding, contractions, leaking of fluid and fetal movement were reviewed in detail with the patient. Please refer to After Visit Summary for other counseling recommendations.  No follow-ups on file.  Future Appointments  Date Time Provider Department Center  06/23/2018  8:00 AM York SpanielWillis, Charles K, MD [facility] None    Sharen CounterLisa Leftwich-Kirby, CNM

## 2018-03-07 ENCOUNTER — Ambulatory Visit (INDEPENDENT_AMBULATORY_CARE_PROVIDER_SITE_OTHER): Payer: 59 | Admitting: Advanced Practice Midwife

## 2018-03-07 VITALS — BP 113/72 | HR 85 | Wt 149.0 lb

## 2018-03-07 DIAGNOSIS — Z3403 Encounter for supervision of normal first pregnancy, third trimester: Secondary | ICD-10-CM

## 2018-03-07 DIAGNOSIS — Z113 Encounter for screening for infections with a predominantly sexual mode of transmission: Secondary | ICD-10-CM

## 2018-03-07 LAB — OB RESULTS CONSOLE GBS: GBS: POSITIVE

## 2018-03-07 LAB — OB RESULTS CONSOLE GC/CHLAMYDIA: Gonorrhea: NEGATIVE

## 2018-03-07 NOTE — Patient Instructions (Signed)

## 2018-03-07 NOTE — Progress Notes (Signed)
   PRENATAL VISIT NOTE  Subjective:  Jordan [name] is a 27 y.o. G1P0000 at 503w1d being seen today for ongoing prenatal care.  She is currently monitored for the following issues for this low-risk pregnancy and has Ophthalmoplegic migraine, not intractable; Supervision of normal first pregnancy, antepartum; B12 deficiency anemia; and Vitamin D deficiency on their problem list.  Patient reports occasional contractions.  Contractions: Not present. Vag. Bleeding: None.  Movement: Present. Denies leaking of fluid.   The following portions of the patient's history were reviewed and updated as appropriate: allergies, current medications, past family history, past medical history, past social history, past surgical history and problem list. Problem list updated.  Objective:   Vitals:   03/07/18 0828  BP: 113/72  Pulse: 85  Weight: 67.6 kg    Fetal Status: Fetal Heart Rate (bpm): 143 Fundal Height: 36 cm Movement: Present  Presentation: Vertex  General:  Alert, oriented and cooperative. Patient is in no acute distress.  Skin: Skin is warm and dry. No rash noted.   Cardiovascular: Normal heart rate noted  Respiratory: Normal respiratory effort, no problems with respiration noted  Abdomen: Soft, gravid, appropriate for gestational age.  Pain/Pressure: Absent     Pelvic: Cervical exam performed Dilation: Closed Effacement (%): 60 Station: -2  Extremities: Normal range of motion.  Edema: None  Mental Status: Normal mood and affect. Normal behavior. Normal judgment and thought content.   Assessment and Plan:  Pregnancy: G1P0000 at 513w1d  1. Encounter for supervision of normal first pregnancy in third trimester --Anticipatory guidance about next visits/weeks of pregnancy given. --Questions answered, reasons to come to hospital, no need to call ahead just come to [facility].   - [facility]lam[facility] - Culture, beta strep (group b only)  Term labor symptoms and general  obstetric precautions including but not limited to vaginal bleeding, contractions, leaking of fluid and fetal movement were reviewed in detail with the patient. Please refer to After Visit Summary for other counseling recommendations.  Return in about 1 week (around 03/14/2018).  Future Appointments  Date Time Provider Department Center  06/23/2018  8:00 AM York SpanielWillis, Charles K, MD [facility] None    Sharen CounterLisa Leftwich-Kirby, CNM

## 2018-03-08 LAB — GC/CHLAMYDIA PROBE AMP (~~LOC~~) NOT AT ARMC
Chlamydia: NEGATIVE
Neisseria Gonorrhea: NEGATIVE

## 2018-03-10 LAB — CULTURE, BETA STREP (GROUP B ONLY)
MICRO NUMBER:: 91446127
SPECIMEN QUALITY:: ADEQUATE

## 2018-03-11 ENCOUNTER — Encounter: Payer: Self-pay | Admitting: Advanced Practice Midwife

## 2018-03-11 DIAGNOSIS — O99820 Streptococcus B carrier state complicating pregnancy: Secondary | ICD-10-CM | POA: Insufficient documentation

## 2018-03-13 ENCOUNTER — Other Ambulatory Visit: Payer: Self-pay

## 2018-03-13 ENCOUNTER — Inpatient Hospital Stay (HOSPITAL_COMMUNITY)
Admission: AD | Admit: 2018-03-13 | Discharge: 2018-03-16 | DRG: 807 | Disposition: A | Discharge status: Home / Self Care | Payer: 59 | Attending: Family Medicine | Admitting: Family Medicine

## 2018-03-13 ENCOUNTER — Encounter (HOSPITAL_COMMUNITY): Payer: Self-pay

## 2018-03-13 DIAGNOSIS — O99824 Streptococcus B carrier state complicating childbirth: Secondary | ICD-10-CM | POA: Diagnosis present

## 2018-03-13 DIAGNOSIS — D649 Anemia, unspecified: Secondary | ICD-10-CM | POA: Diagnosis present

## 2018-03-13 DIAGNOSIS — O99820 Streptococcus B carrier state complicating pregnancy: Secondary | ICD-10-CM

## 2018-03-13 DIAGNOSIS — O9902 Anemia complicating childbirth: Secondary | ICD-10-CM | POA: Diagnosis present

## 2018-03-13 DIAGNOSIS — O99214 Obesity complicating childbirth: Secondary | ICD-10-CM | POA: Diagnosis present

## 2018-03-13 DIAGNOSIS — Z3400 Encounter for supervision of normal first pregnancy, unspecified trimester: Secondary | ICD-10-CM

## 2018-03-13 DIAGNOSIS — O322XX0 Maternal care for transverse and oblique lie, not applicable or unspecified: Secondary | ICD-10-CM | POA: Diagnosis present

## 2018-03-13 DIAGNOSIS — O4290 Premature rupture of membranes, unspecified as to length of time between rupture and onset of labor, unspecified weeks of gestation: Secondary | ICD-10-CM | POA: Diagnosis present

## 2018-03-13 DIAGNOSIS — O4292 Full-term premature rupture of membranes, unspecified as to length of time between rupture and onset of labor: Secondary | ICD-10-CM | POA: Diagnosis present

## 2018-03-13 DIAGNOSIS — Z3A37 37 weeks gestation of pregnancy: Secondary | ICD-10-CM

## 2018-03-13 DIAGNOSIS — O4202 Full-term premature rupture of membranes, onset of labor within 24 hours of rupture: Secondary | ICD-10-CM | POA: Diagnosis not present

## 2018-03-13 HISTORY — DX: Premature rupture of membranes, unspecified as to length of time between rupture and onset of labor, unspecified weeks of gestation: O42.90

## 2018-03-13 LAB — ABO/RH: ABO/RH(D): O POS

## 2018-03-13 LAB — URINALYSIS, ROUTINE W REFLEX MICROSCOPIC
Bilirubin Urine: NEGATIVE
Glucose, UA: NEGATIVE mg/dL
Ketones, ur: 20 mg/dL — AB
Leukocytes, UA: NEGATIVE
NITRITE: NEGATIVE
PH: 5 (ref 5.0–8.0)
Protein, ur: 30 mg/dL — AB
SPECIFIC GRAVITY, URINE: 1.025 (ref 1.005–1.030)

## 2018-03-13 LAB — TYPE AND SCREEN
ABO/RH(D): O POS
Antibody Screen: NEGATIVE

## 2018-03-13 LAB — CBC
HCT: 35.2 % — ABNORMAL LOW (ref 36.0–46.0)
Hemoglobin: 11.6 g/dL — ABNORMAL LOW (ref 12.0–15.0)
MCH: 32.7 pg (ref 26.0–34.0)
MCHC: 33 g/dL (ref 30.0–36.0)
MCV: 99.2 fL (ref 80.0–100.0)
Platelets: 185 10*3/uL (ref 150–400)
RBC: 3.55 MIL/uL — ABNORMAL LOW (ref 3.87–5.11)
RDW: 13.5 % (ref 11.5–15.5)
WBC: 14.3 10*3/uL — ABNORMAL HIGH (ref 4.0–10.5)
nRBC: 0 % (ref 0.0–0.2)

## 2018-03-13 LAB — RPR: RPR Ser Ql: NONREACTIVE

## 2018-03-13 LAB — POCT FERN TEST: POCT Fern Test: POSITIVE

## 2018-03-13 MED ORDER — SODIUM CHLORIDE 0.9 % IV SOLN
5.0000 10*6.[IU] | Freq: Once | INTRAVENOUS | Status: AC
  Administered 2018-03-13: 5 10*6.[IU] via INTRAVENOUS
  Filled 2018-03-13: qty 5

## 2018-03-13 MED ORDER — SOD CITRATE-CITRIC ACID 500-334 MG/5ML PO SOLN: 30.0000 mL | ORAL | Status: DC | PRN

## 2018-03-13 MED ORDER — SODIUM CHLORIDE 0.9 % IV SOLN: INTRAVENOUS | Status: DC | PRN

## 2018-03-13 MED ORDER — PENICILLIN G 3 MILLION UNITS IVPB - SIMPLE MED
3.0000 10*6.[IU] | INTRAVENOUS | Status: DC
  Administered 2018-03-13 – 2018-03-14 (×5): 3 10*6.[IU] via INTRAVENOUS
  Filled 2018-03-13 (×5): qty 100

## 2018-03-13 MED ORDER — LACTATED RINGERS IV SOLN
INTRAVENOUS | Status: DC
  Administered 2018-03-13 – 2018-03-14 (×2): via INTRAVENOUS

## 2018-03-13 MED ORDER — PENICILLIN G 3 MILLION UNITS IVPB - SIMPLE MED
3.0000 10*6.[IU] | INTRAVENOUS | Status: DC
  Administered 2018-03-13: 3 10*6.[IU] via INTRAVENOUS
  Filled 2018-03-13 (×3): qty 100

## 2018-03-13 MED ORDER — OXYCODONE-ACETAMINOPHEN 5-325 MG PO TABS: 2.0000 | ORAL_TABLET | ORAL | Status: DC | PRN

## 2018-03-13 MED ORDER — ACETAMINOPHEN 325 MG PO TABS
650.0000 mg | ORAL_TABLET | ORAL | Status: DC | PRN
  Administered 2018-03-14: 650 mg via ORAL
  Filled 2018-03-13: qty 2

## 2018-03-13 MED ORDER — TERBUTALINE SULFATE 1 MG/ML IJ SOLN
0.2500 mg | Freq: Once | INTRAMUSCULAR | Status: DC | PRN
  Filled 2018-03-13: qty 1

## 2018-03-13 MED ORDER — FENTANYL CITRATE (PF) 100 MCG/2ML IJ SOLN
100.0000 ug | INTRAMUSCULAR | Status: DC | PRN
  Administered 2018-03-13: 100 ug via INTRAVENOUS
  Filled 2018-03-13: qty 2

## 2018-03-13 MED ORDER — LIDOCAINE HCL (PF) 1 % IJ SOLN
30.0000 mL | INTRAMUSCULAR | Status: DC | PRN
  Filled 2018-03-13: qty 30

## 2018-03-13 MED ORDER — MISOPROSTOL 50MCG HALF TABLET
50.0000 ug | ORAL_TABLET | ORAL | Status: DC
  Administered 2018-03-13 (×2): 50 ug via ORAL
  Filled 2018-03-13 (×2): qty 1

## 2018-03-13 MED ORDER — OXYCODONE-ACETAMINOPHEN 5-325 MG PO TABS: 1.0000 | ORAL_TABLET | ORAL | Status: DC | PRN

## 2018-03-13 MED ORDER — LACTATED RINGERS IV SOLN
500.0000 mL | INTRAVENOUS | Status: DC | PRN
  Administered 2018-03-14: 500 mL via INTRAVENOUS

## 2018-03-13 MED ORDER — ONDANSETRON HCL 4 MG/2ML IJ SOLN: 4.0000 mg | Freq: Four times a day (QID) | INTRAMUSCULAR | Status: DC | PRN

## 2018-03-13 MED ORDER — OXYTOCIN 40 UNITS IN LACTATED RINGERS INFUSION - SIMPLE MED: 2.5000 [IU]/h | INTRAVENOUS | Status: DC

## 2018-03-13 MED ORDER — OXYTOCIN BOLUS FROM INFUSION
500.0000 mL | Freq: Once | INTRAVENOUS | Status: AC
  Administered 2018-03-14: 500 mL via INTRAVENOUS

## 2018-03-13 NOTE — Progress Notes (Signed)
Pt initially in room 173. Moved to room 170 at 0418 due to computer problems

## 2018-03-13 NOTE — Progress Notes (Signed)
May remove monitors and patient get into shower per Dr. Earlene PlaterWallace

## 2018-03-13 NOTE — Anesthesia Pain Management Evaluation Note (Signed)
  CRNA Pain Management Visit Note  Patient: Jordan [name], 27 y.o., female  "Hello I am a member of the anesthesia team at [facility]. We have an anesthesia team available at all times to provide care throughout the hospital, including epidural management and anesthesia for C-section. I don't know your plan for the delivery whether it a natural birth, water birth, IV sedation, nitrous supplementation, doula or epidural, but we want to meet your pain goals."   1.Was your pain managed to your expectations on prior hospitalizations?   No prior hospitalizations  2.What is your expectation for pain management during this hospitalization?     Labor support without medications  3.How can we help you reach that goal? natural  Record the patient's initial score and the patient's pain goal.   Pain: 6  Pain Goal: 8 [facility] wants you to be able to say your pain was always managed very well.  Rafaelita Foister 03/13/2018

## 2018-03-13 NOTE — MAU Note (Signed)
Pt here with c/o ROM at 2200. Denies any bleeding. Having occasional contractions. Reports good fetal movement. Cervix was 60% effaced but closed.

## 2018-03-13 NOTE — Progress Notes (Signed)
LABOR PROGRESS NOTE  Theone MurdochKaelin M Vanyo is a 27 y.o. G1P0000 at 2843w0d  admitted for PROM  Subjective: Not feeling very uncomfortable with contractions Contractions still very intermittent Would like to avoid medication as much as possible Agreeable to cytotec now with some discussion Will also get breast pump from home to start nipple stimulation   Objective: BP (!) 102/58 (BP Location: Right Arm)   Pulse 75   Temp 98.2 F (36.8 C) (Oral)   Resp 16   Ht 4\' 10"  (1.473 m)   Wt 68.9 kg   LMP 06/27/2017   SpO2 100%   BMI 31.77 kg/m  or  Vitals:   03/13/18 0355 03/13/18 0424 03/13/18 0547 03/13/18 0837  BP: 122/80 132/78 109/68 (!) 102/58  Pulse: 90 88 85 75  Resp: 18  18 16   Temp: 98.8 F (37.1 C)  98.4 F (36.9 C) 98.2 F (36.8 C)  TempSrc: Oral  Oral Oral  SpO2:      Weight:      Height:        Dilation: Fingertip Effacement (%): 60 Cervical Position: Posterior Station: -3, -2 Presentation: Vertex Exam by:: Renaldo HarrisonGoss, RNC FHT: baseline rate 130-40s, moderate varibility, + acel, no decel Toco: intermittent contractions   Labs: Lab Results  Component Value Date   WBC 14.3 (H) 03/13/2018   HGB 11.6 (L) 03/13/2018   HCT 35.2 (L) 03/13/2018   MCV 99.2 03/13/2018   PLT 185 03/13/2018    Patient Active Problem List   Diagnosis Date Noted  . PROM (premature rupture of membranes) 03/13/2018  . GBS (group B Streptococcus carrier), +RV culture, currently pregnant 03/11/2018  . Supervision of normal first pregnancy, antepartum 08/08/2017  . Ophthalmoplegic migraine, not intractable 10/08/2016  . B12 deficiency anemia 11/12/2011  . Vitamin D deficiency 11/12/2011    Assessment / Plan: 27 y.o. G1P0000 at 6443w0d here for PROM  Labor: start cervical ripening with cytotec Fetal Wellbeing:  Cat 1 Pain Control:  Desires unmedicated delivery  Anticipated MOD:  SVD  Gwenevere AbbotNimeka Story Conti, MD OB Fellow  03/13/2018, 12:56 PM

## 2018-03-13 NOTE — H&P (Signed)
Jordan [name] is a 27 y.o. female presenting for leaking of fluid since 2200.  HAs some mild contractions.  Pregnancy has been followed at [facility] office and normal  Did have migraines but no pregnancy complications. .  RN Note: Pt here with c/o ROM at 2200. Denies any bleeding. Having occasional contractions. Reports good fetal movement. Cervix was 60% effaced but closed  OB History    Gravida  1   Para  0   Term  0   Preterm  0   AB  0   Living  0     SAB  0   TAB  0   Ectopic  0   Multiple  0   Live Births  0          Past Medical History:  Diagnosis Date  . Migraines    Past Surgical History:  Procedure Laterality Date  . WISDOM TOOTH EXTRACTION  May 2012   Family History: family history includes Cancer in her maternal grandfather and maternal grandmother; Hyperlipidemia in her father; Hypertension in her father. Social History:  reports that she has never smoked. She has never used smokeless tobacco. She reports that she drank alcohol. She reports that she does not use drugs.     Maternal Diabetes: No Genetic Screening: Normal Maternal Ultrasounds/Referrals: Normal Fetal Ultrasounds or other Referrals:  None Maternal Substance Abuse:  No Significant Maternal Medications:  None Significant Maternal Lab Results:  Lab values include: Group B Strep positive Other Comments:  None  Review of Systems  Constitutional: Negative for chills, fever and malaise/fatigue.  Eyes: Negative for blurred vision.  Gastrointestinal: Positive for abdominal pain (Mild contractions). Negative for constipation, diarrhea, nausea and vomiting.  Genitourinary: Negative for frequency.  Neurological: Negative for focal weakness and headaches.   Maternal Medical History:  Reason for admission: Rupture of membranes.  Nausea.  Contractions: Onset was 3-5 hours ago.   Frequency: irregular.   Perceived severity is mild.    Fetal activity: Perceived fetal activity is  normal.   Last perceived fetal movement was within the past hour.    Prenatal complications: No bleeding, PIH, placental abnormality, pre-eclampsia or preterm labor.   Prenatal Complications - Diabetes: none.    Dilation: Fingertip Effacement (%): 60 Station: -2 Exam by:: T Lytle RN  Blood pressure 125/85, pulse 99, temperature 98.1 F (36.7 C), temperature source Oral, resp. rate 18, height 4\' 10"  (1.473 m), weight 68.9 kg, last menstrual period 06/27/2017, SpO2 100 %. Maternal Exam:  Uterine Assessment: Contraction strength is mild.  Contraction frequency is irregular.   Abdomen: Patient reports no abdominal tenderness. Fetal presentation: vertex  Introitus: Normal vulva. Normal vagina.  Ferning test: positive.  Nitrazine test: not done. Amniotic fluid character: clear.  Pelvis: adequate for delivery.   Cervix: Cervix evaluated by digital exam.     Fetal Exam Fetal Monitor Review: Mode: ultrasound.   Baseline rate: 140.  Variability: moderate (6-25 bpm).   Pattern: accelerations present and no decelerations.    Fetal State Assessment: Category I - tracings are normal.     Physical Exam  Constitutional: She is oriented to person, place, and time. She appears well-developed and well-nourished. No distress.  HENT:  Head: Normocephalic.  Cardiovascular: Normal rate and regular rhythm.  Respiratory: Effort normal. No respiratory distress.  GI: Soft. She exhibits no distension. There is no tenderness. There is no rebound and no guarding.  Genitourinary:  Genitourinary Comments: Dilation: Fingertip Effacement (%): 60 Cervical Position: Posterior Station: -  2 Presentation: Vertex Exam by:: Bari Mantis Lytle RN    Musculoskeletal: Normal range of motion.  Neurological: She is alert and oriented to person, place, and time.  Skin: Skin is warm and dry.  Psychiatric: She has a normal mood and affect.    Prenatal labs: ABO, Rh: O/Positive/-- (05/06 1027) Antibody: Negative  (05/06 1027) Rubella: 1.52 (05/06 1027) RPR: NON-REACTIVE (10/08 0809)  HBsAg: Negative (05/06 1027)  HIV: NON-REACTIVE (10/08 0809)  GBS:     Assessment/Plan: Single intrauterine pregnancy   PROM at term Latent phase labor  [facility] Patient desires low intervention labor/birth Offered Pitocin augmentation, declines for now   Wynelle BourgeoisMarie  03/13/2018, 2:59 AM

## 2018-03-14 ENCOUNTER — Inpatient Hospital Stay (HOSPITAL_COMMUNITY): Payer: 59 | Admitting: Anesthesiology

## 2018-03-14 ENCOUNTER — Encounter: Payer: 59 | Admitting: Certified Nurse Midwife

## 2018-03-14 ENCOUNTER — Encounter (HOSPITAL_COMMUNITY): Payer: Self-pay | Admitting: Anesthesiology

## 2018-03-14 DIAGNOSIS — O4202 Full-term premature rupture of membranes, onset of labor within 24 hours of rupture: Secondary | ICD-10-CM

## 2018-03-14 DIAGNOSIS — Z3A37 37 weeks gestation of pregnancy: Secondary | ICD-10-CM

## 2018-03-14 MED ORDER — SIMETHICONE 80 MG PO CHEW: 80.0000 mg | CHEWABLE_TABLET | ORAL | Status: DC | PRN

## 2018-03-14 MED ORDER — COCONUT OIL OIL
1.0000 "application " | TOPICAL_OIL | Status: DC | PRN
  Filled 2018-03-14: qty 120

## 2018-03-14 MED ORDER — SENNOSIDES-DOCUSATE SODIUM 8.6-50 MG PO TABS
2.0000 | ORAL_TABLET | ORAL | Status: DC
  Administered 2018-03-14 – 2018-03-15 (×2): 2 via ORAL
  Filled 2018-03-14 (×2): qty 2

## 2018-03-14 MED ORDER — OXYCODONE HCL 5 MG PO TABS: 5.0000 mg | ORAL_TABLET | ORAL | Status: DC | PRN

## 2018-03-14 MED ORDER — IBUPROFEN 600 MG PO TABS
600.0000 mg | ORAL_TABLET | Freq: Four times a day (QID) | ORAL | Status: DC
  Administered 2018-03-14 – 2018-03-16 (×8): 600 mg via ORAL
  Filled 2018-03-14 (×9): qty 1

## 2018-03-14 MED ORDER — OXYCODONE HCL 5 MG PO TABS: 10.0000 mg | ORAL_TABLET | ORAL | Status: DC | PRN

## 2018-03-14 MED ORDER — TETANUS-DIPHTH-ACELL PERTUSSIS 5-2.5-18.5 LF-MCG/0.5 IM SUSP: 0.5000 mL | Freq: Once | INTRAMUSCULAR | Status: DC

## 2018-03-14 MED ORDER — PHENYLEPHRINE 40 MCG/ML (10ML) SYRINGE FOR IV PUSH (FOR BLOOD PRESSURE SUPPORT)
PREFILLED_SYRINGE | INTRAVENOUS | Status: AC
  Filled 2018-03-14: qty 10

## 2018-03-14 MED ORDER — DIBUCAINE 1 % RE OINT: 1.0000 "application " | TOPICAL_OINTMENT | RECTAL | Status: DC | PRN

## 2018-03-14 MED ORDER — LIDOCAINE HCL (PF) 1 % IJ SOLN
INTRAMUSCULAR | Status: DC | PRN
  Administered 2018-03-14: 3 mL via EPIDURAL
  Administered 2018-03-14: 4 mL via EPIDURAL

## 2018-03-14 MED ORDER — FENTANYL 2.5 MCG/ML BUPIVACAINE 1/10 % EPIDURAL INFUSION (WH - ANES)
10.0000 mL/h | INTRAMUSCULAR | Status: DC | PRN
  Administered 2018-03-14: 10.5 mL/h via EPIDURAL
  Administered 2018-03-14: 10 mL/h via EPIDURAL
  Filled 2018-03-14: qty 100

## 2018-03-14 MED ORDER — DIPHENHYDRAMINE HCL 50 MG/ML IJ SOLN: 12.5000 mg | INTRAMUSCULAR | Status: DC | PRN

## 2018-03-14 MED ORDER — METHYLERGONOVINE MALEATE 0.2 MG/ML IJ SOLN
0.2000 mg | Freq: Once | INTRAMUSCULAR | Status: AC
  Administered 2018-03-14: 0.2 mg via INTRAMUSCULAR

## 2018-03-14 MED ORDER — ONDANSETRON HCL 4 MG/2ML IJ SOLN: 4.0000 mg | INTRAMUSCULAR | Status: DC | PRN

## 2018-03-14 MED ORDER — PRENATAL MULTIVITAMIN CH
1.0000 | ORAL_TABLET | Freq: Every day | ORAL | Status: DC
  Administered 2018-03-14 – 2018-03-16 (×3): 1 via ORAL
  Filled 2018-03-14 (×4): qty 1

## 2018-03-14 MED ORDER — ONDANSETRON HCL 4 MG PO TABS: 4.0000 mg | ORAL_TABLET | ORAL | Status: DC | PRN

## 2018-03-14 MED ORDER — DIPHENHYDRAMINE HCL 25 MG PO CAPS: 25.0000 mg | ORAL_CAPSULE | Freq: Four times a day (QID) | ORAL | Status: DC | PRN

## 2018-03-14 MED ORDER — MEASLES, MUMPS & RUBELLA VAC IJ SOLR
0.5000 mL | Freq: Once | INTRAMUSCULAR | Status: DC
  Filled 2018-03-14: qty 0.5

## 2018-03-14 MED ORDER — PHENYLEPHRINE 40 MCG/ML (10ML) SYRINGE FOR IV PUSH (FOR BLOOD PRESSURE SUPPORT)
80.0000 ug | PREFILLED_SYRINGE | INTRAVENOUS | Status: DC | PRN
  Filled 2018-03-14: qty 10

## 2018-03-14 MED ORDER — ZOLPIDEM TARTRATE 5 MG PO TABS: 5.0000 mg | ORAL_TABLET | Freq: Every evening | ORAL | Status: DC | PRN

## 2018-03-14 MED ORDER — FENTANYL 2.5 MCG/ML BUPIVACAINE 1/10 % EPIDURAL INFUSION (WH - ANES)
INTRAMUSCULAR | Status: AC
  Filled 2018-03-14: qty 100

## 2018-03-14 MED ORDER — WITCH HAZEL-GLYCERIN EX PADS: 1.0000 "application " | MEDICATED_PAD | CUTANEOUS | Status: DC | PRN

## 2018-03-14 MED ORDER — OXYTOCIN 40 UNITS IN LACTATED RINGERS INFUSION - SIMPLE MED
1.0000 m[IU]/min | INTRAVENOUS | Status: DC
  Administered 2018-03-14: 2 m[IU]/min via INTRAVENOUS
  Filled 2018-03-14: qty 1000

## 2018-03-14 MED ORDER — METHYLERGONOVINE MALEATE 0.2 MG/ML IJ SOLN
INTRAMUSCULAR | Status: AC
  Filled 2018-03-14: qty 1

## 2018-03-14 MED ORDER — LACTATED RINGERS IV SOLN: 500.0000 mL | Freq: Once | INTRAVENOUS | Status: DC

## 2018-03-14 MED ORDER — ACETAMINOPHEN 325 MG PO TABS: 650.0000 mg | ORAL_TABLET | ORAL | Status: DC | PRN

## 2018-03-14 MED ORDER — BENZOCAINE-MENTHOL 20-0.5 % EX AERO
1.0000 "application " | INHALATION_SPRAY | CUTANEOUS | Status: DC | PRN
  Administered 2018-03-14: 1 via TOPICAL
  Filled 2018-03-14: qty 56

## 2018-03-14 NOTE — Anesthesia Procedure Notes (Signed)
Epidural Patient location during procedure: OB Start time: 03/14/2018 12:34 AM End time: 03/14/2018 12:43 AM  Staffing Anesthesiologist: Mal AmabileFoster, Jaevian Shean, MD Performed: anesthesiologist   Preanesthetic Checklist Completed: patient identified, site marked, surgical consent, pre-op evaluation, timeout performed, IV checked, risks and benefits discussed and monitors and equipment checked  Epidural Patient position: sitting Prep: site prepped and draped and DuraPrep Patient monitoring: continuous pulse ox and blood pressure Approach: midline Location: L3-L4 Injection technique: LOR air  Needle:  Needle type: Tuohy  Needle gauge: 17 G Needle length: 9 cm and 9 Needle insertion depth: 6 cm Catheter type: closed end flexible Catheter size: 19 Gauge Catheter at skin depth: 11 cm Test dose: negative and Other  Assessment Events: blood not aspirated, injection not painful, no injection resistance, negative IV test and no paresthesia  Additional Notes Patient identified. Risks and benefits discussed including failed block, incomplete  Pain control, post dural puncture headache, nerve damage, paralysis, blood pressure Changes, nausea, vomiting, reactions to medications-both toxic and allergic and post Partum back pain. All questions were answered. Patient expressed understanding and wished to proceed. Sterile technique was used throughout procedure. Epidural site was Dressed with sterile barrier dressing. No paresthesias, signs of intravascular injection Or signs of intrathecal spread were encountered.  Patient was more comfortable after the epidural was dosed. Please see RN's note for documentation of vital signs and FHR which are stable.

## 2018-03-14 NOTE — Lactation Note (Signed)
This note was copied from a baby's chart. Lactation Consultation Note  Patient Name: Jordan [name]: 03/14/2018 Reason for consult: Initial assessment;Early term 37-38.6wks;Primapara Breastfeeding consultation services and support information given and reviewed.  Newborn is 7 hours old and has had only latch attempts.  Baby is currently sleeping.  Unwrapped and positioned skin to skin in cross cradle hold.  Mom has areolar swelling and breast is difficult to compress.  Nipple inverts with compression.  Baby sleepy at the breast and showing no effort to feed.  Baby left skin to skin on mom's chest.  Breast shells given to mom with instructions.  Encouraged to watch for feeding cues and call for assist prn.  Maternal Data Has patient been taught Hand Expression?: Yes Does the patient have breastfeeding experience prior to this delivery?: No  Feeding Feeding Type: Breast Fed  LATCH Score Latch: Too sleepy or reluctant, no latch achieved, no sucking elicited.  Audible Swallowing: None  Type of Nipple: Flat  Comfort (Breast/Nipple): Soft / non-tender  Hold (Positioning): Assistance needed to correctly position infant at breast and maintain latch.  LATCH Score: 4  Interventions Interventions: Assisted with latch;Breast compression;Shells;Skin to skin;Adjust position;Breast massage;Support pillows;Hand pump;Hand express;Position options  Lactation Tools Discussed/Used Tools: Shells Shell Type: Inverted   Consult Status Consult Status: Follow-up [name]: 03/15/18 Follow-up type: In-patient    Huston FoleyMOULDEN, Johnney Scarlata S 03/14/2018, 2:57 PM

## 2018-03-14 NOTE — Anesthesia Preprocedure Evaluation (Signed)
Anesthesia Evaluation  Patient identified by MRN, date of birth, ID band Patient awake    Reviewed: Allergy & Precautions, Patient's Chart, lab work & pertinent test results  Airway Mallampati: II  TM Distance: >3 FB Neck ROM: Full    Dental no notable dental hx. (+) Teeth Intact   Pulmonary neg pulmonary ROS,    Pulmonary exam normal breath sounds clear to auscultation       Cardiovascular negative cardio ROS Normal cardiovascular exam Rhythm:Regular Rate:Normal     Neuro/Psych  Headaches, negative psych ROS   GI/Hepatic Neg liver ROS, GERD  ,  Endo/Other  Obesity  Renal/GU negative Renal ROS  negative genitourinary   Musculoskeletal negative musculoskeletal ROS (+)   Abdominal (+) + obese,   Peds  Hematology  (+) anemia ,   Anesthesia Other Findings   Reproductive/Obstetrics (+) Pregnancy                             Anesthesia Physical Anesthesia Plan  ASA: II  Anesthesia Plan: Epidural   Post-op Pain Management:    Induction:   PONV Risk Score and Plan:   Airway Management Planned: Natural Airway  Additional Equipment:   Intra-op Plan:   Post-operative Plan:   Informed Consent: I have reviewed the patients History and Physical, chart, labs and discussed the procedure including the risks, benefits and alternatives for the proposed anesthesia with the patient or authorized representative who has indicated his/her understanding and acceptance.       Plan Discussed with: Anesthesiologist  Anesthesia Plan Comments:         Anesthesia Quick Evaluation  

## 2018-03-14 NOTE — Progress Notes (Signed)
OB/GYN Faculty Practice: Labor Progress Note  Subjective: Feeling very uncomfortable with contractions. Unsure next steps for pain control. Was able to sleep a little bit with fentanyl but now contractions more regular and intense.  Objective: BP 115/72   Pulse 80   Temp 98.5 F (36.9 C) (Oral)   Resp 18   Ht 4\' 10"  (1.473 m)   Wt 68.9 kg   LMP 06/27/2017   SpO2 100%   BMI 31.77 kg/m  Gen: uncomfor Dilation: 4 Effacement (%): 90 Cervical Position: Middle Station: -1 Presentation: Vertex Exam by:: Dr. Earlene PlaterWallace  Assessment and Plan: 27 y.o. G1P0000 7217w1d here with PROM 03/12/18 at 2200.   Labor: ROM now for more than 24 hours. Just AROM of forebag with cervical check. Has received two doses of cytotec with good cervical ripening, contractions every 5-7 minutes. Initially reluctant to interventions but now agreeable for pitocin and epidural.  -- s/p cyto x 2  -- start pitocin  -- pain control: planning on epidural, will notify anesthesia -- PPH Risk: low  Fetal Well-Being: EFW 7lbs by leopold's. Cephalic by sutures.  -- Category I - continuous fetal monitoring  -- GBS positive    Laurel S. Earlene PlaterWallace, DO OB/GYN Fellow, Faculty Practice  12:10 AM

## 2018-03-14 NOTE — Anesthesia Postprocedure Evaluation (Signed)
Anesthesia Post Note  Patient: Jordan [name]  Procedure(s) Performed: AN AD HOC LABOR EPIDURAL     Patient location during evaluation: Mother Baby Anesthesia Type: Epidural Level of consciousness: awake Pain management: satisfactory to patient Vital Signs Assessment: post-procedure vital signs reviewed and stable Respiratory status: spontaneous breathing Cardiovascular status: stable Anesthetic complications: no    Last Vitals:  Vitals:   03/14/18 1501 03/14/18 1827  BP: 109/75 122/69  Pulse: 93 97  Resp: 16 16  Temp: 36.4 C 36.8 C  SpO2: 100% 99%    Last Pain:  Vitals:   03/14/18 1827  TempSrc: Oral  PainSc:    Pain Goal: Patients Stated Pain Goal: 3 (03/14/18 1619)               Cephus ShellingBURGER,Treyon Wymore

## 2018-03-14 NOTE — Lactation Note (Signed)
This note was copied from a baby's chart. Lactation Consultation Note  Patient Name: Jordan [name] [id number]'[id number]'s Date: 03/14/2018 Reason for consult: Follow-up assessment;Difficult latch;Primapara;1st time breastfeeding;Early term 37-38.6wks  P1 mother whose infant is now 619 hours old.  This is an ETI at 37+1 weeks.  Mother requested latch assistance.  When I arrived she had not been able to get baby to latch.  Offered to assist and mother accepted.  Performed suck training for about 5 minutes prior to latching.  Baby did not have a coordinated suck and wanted to bite on my gloved finger.  Mother's breasts are soft and nipples are very short shafted bilaterally.  She was using breast shells and I also provided a manual pump to help evert nipples prior to latching.  Attempted to latch to the left breast in the football hold without success.  Baby was able to latch but made no effort to suck.  Attempted to latch to the right breast in the same hold.  She latched and took a couple of sucks before refusing to continue.    Taught hand expression and mother did a return demonstration.  She was able to obtain a few drops of colostrum which I finger fed back to baby.  Offered to initiate the DEBP and mother willing to pump.  Pump parts, assembly, disassembly and cleaning reviewed.  #24 flange size changed to a #27 flange size for greater comfort.  Mother will hand express after pumping and feed back any EBM she obtains.  Spoon provided for another option for feeding.  Mother will call for latch assistance as needed.  Father and family member present.   Maternal Data Formula Feeding for Exclusion: No Has patient been taught Hand Expression?: Yes Does the patient have breastfeeding experience prior to this delivery?: No  Feeding Feeding Type: Breast Fed  LATCH Score Latch: Too sleepy or reluctant, no latch achieved, no sucking elicited.  Audible Swallowing: None  Type of Nipple: Everted at rest and  after stimulation(very short shafted; has breast shells and manual pump given)  Comfort (Breast/Nipple): Soft / non-tender  Hold (Positioning): Assistance needed to correctly position infant at breast and maintain latch.  LATCH Score: 5  Interventions Interventions: Breast feeding basics reviewed;Assisted with latch;Skin to skin;Breast massage;Hand express;Pre-pump if needed;Breast compression;Adjust position;DEBP;Shells;Expressed milk;Position options;Support pillows  Lactation Tools Discussed/Used Tools: Shells Shell Type: Inverted Pump Review: Setup, frequency, and cleaning;Milk Storage Initiated by:: Jordan [name] Date initiated:: 03/14/18   Consult Status Consult Status: Follow-up Date: 03/15/18 Follow-up type: In-patient    Jordan [name] 03/14/2018, 6:19 PM

## 2018-03-15 LAB — CBC
HCT: 24.1 % — ABNORMAL LOW (ref 36.0–46.0)
Hemoglobin: 8.2 g/dL — ABNORMAL LOW (ref 12.0–15.0)
MCH: 33.5 pg (ref 26.0–34.0)
MCHC: 34 g/dL (ref 30.0–36.0)
MCV: 98.4 fL (ref 80.0–100.0)
Platelets: 138 10*3/uL — ABNORMAL LOW (ref 150–400)
RBC: 2.45 MIL/uL — ABNORMAL LOW (ref 3.87–5.11)
RDW: 13.6 % (ref 11.5–15.5)
WBC: 11.4 10*3/uL — ABNORMAL HIGH (ref 4.0–10.5)
nRBC: 0 % (ref 0.0–0.2)

## 2018-03-15 MED ORDER — POLYETHYLENE GLYCOL 3350 17 G PO PACK
17.0000 g | PACK | Freq: Two times a day (BID) | ORAL | Status: DC
  Administered 2018-03-15: 17 g via ORAL
  Filled 2018-03-15 (×5): qty 1

## 2018-03-15 MED ORDER — SODIUM CHLORIDE 0.9 % IV SOLN
510.0000 mg | Freq: Once | INTRAVENOUS | Status: AC
  Administered 2018-03-15: 510 mg via INTRAVENOUS
  Filled 2018-03-15: qty 17

## 2018-03-15 MED ORDER — FERROUS SULFATE 325 (65 FE) MG PO TABS
325.0000 mg | ORAL_TABLET | Freq: Two times a day (BID) | ORAL | Status: DC
  Administered 2018-03-15 – 2018-03-16 (×2): 325 mg via ORAL
  Filled 2018-03-15 (×2): qty 1

## 2018-03-15 NOTE — Progress Notes (Signed)
Post Partum Day 1 Subjective: no complaints, up ad lib, voiding, tolerating PO and + flatus  Objective: Blood pressure 103/72, pulse 83, temperature 98.5 F (36.9 C), temperature source Oral, resp. rate 18, height 4\' 10"  (1.473 m), weight 68.9 kg, last menstrual period 06/27/2017, SpO2 99 %, unknown if currently breastfeeding.  Physical Exam:  General: alert, cooperative, appears stated age and no distress Lochia: appropriate Uterine Fundus: firm Incision: NA DVT Evaluation: No evidence of DVT seen on physical exam.  Recent Labs    03/13/18 0332 03/15/18 0520  HGB 11.6* 8.2*  HCT 35.2* 24.1*    Assessment/Plan: Plan for discharge tomorrow  Will add miralax to bowel regimen given stellate laceration Will also add iron given drop in hgb; add PO for now, but will discuss feraheme with patient and infuse if pt is in agreement    LOS: 2 days   Gwenevere AbbotNimeka Novalyn Lajara 03/15/2018, 9:18 AM

## 2018-03-15 NOTE — Progress Notes (Signed)
CSW received consult due to score 9 and and a response of 1 on question #10 on Edinburgh Depression Screen.     When CSW arrived, MOB was bonding with infant as evidence by engaging in skin to skin. MOB and infant appeared very comfortable.  FOB was also present and was watching TV.  CSW explained CSW's role and MOB gave CSW verbal consent to allow FOB to be present during the assessment.  MOB was polite, easy to engage, and receptive to meeting with CSW.   CSW reviewed MOBs' Edinburgh responses and MOB acknowledged that she misread the instructions and MOB has not had any SI in over 5 years. MOB continue to be interested in receiving PMAD education.   CSW provided education regarding Baby Blues vs PMADs and provided MOB with resources for mental health follow up.  CSW encouraged MOB to evaluate her mental health throughout the postpartum period with the use of the New Mom Checklist developed by Postpartum Progress as well as the [place] Postnatal Depression Scale and notify a medical professional if symptoms arise.   MOB and FOB reported having a strong support team and feeling prepared to parent.   There are no barriers to discharge.   Blaine HamperAngel Boyd-Gilyard, MSW, LCSW Clinical Social Work [phone]

## 2018-03-15 NOTE — Discharge Summary (Signed)
Postpartum Discharge Summary     Patient Name: Jordan [name] DOB: 01/25/1991 MRN: [id number]  Date of admission: 03/13/2018 Delivering Provider: Tamera Stands   Date of discharge: 03/15/2018  Admitting diagnosis: 37wks water broke Intrauterine pregnancy: [redacted]w[redacted]d     Secondary diagnosis:  Active Problems:   PROM (premature rupture of membranes)  Additional problems: Edinburgh 9 at admission, seen by CSW     Discharge diagnosis: Term Pregnancy Delivered                                                                                                Post partum procedures:None  Augmentation: Pitocin and Cytotec  Complications: None  Hospital course:  Onset of Labor With Vaginal Delivery     27 y.o. yo G1P1001 at [redacted]w[redacted]d was admitted in Latent Labor following PROM on 03/13/2018. Was augmented with Pitocin with patient voicing strong preference for minimal interventions during management. Patient had an uncomplicated labor course as follows:  Membrane Rupture Time/Date: 10:00 PM ,03/12/2018   Intrapartum Procedures: Episiotomy: None [1]                                         Lacerations:  2nd degree [3]  Patient had a delivery of a Viable infant. 03/14/2018  Information for the patient's newborn:  Virdie, Penning [[id number]]  Delivery Method: Vaginal, Spontaneous(Filed from Delivery Summary)  Had a Stellate vaginal laceration  Pateint had an uncomplicated postpartum course.  She is ambulating, tolerating a regular diet, passing flatus, and urinating well. Patient is discharged home in stable condition on 03/15/18.   Magnesium Sulfate recieved: No BMZ received: No  Physical exam  Vitals:   03/15/18 0550 03/15/18 1442 03/15/18 1659 03/15/18 1740  BP: 103/72 (!) 103/56 108/60 118/67  Pulse: 83 98 100 94  Resp: 18 16 18 18   Temp: 98.5 F (36.9 C) 97.9 F (36.6 C) 98.4 F (36.9 C) 98.5 F (36.9 C)  TempSrc: Oral Oral Oral Oral  SpO2:   100% 100%  Weight:      Height:        General: alert, cooperative and no distress Lochia: appropriate Uterine Fundus: firm Incision: N/A DVT Evaluation: No evidence of DVT seen on physical exam. Negative Homan's sign. No cords or calf tenderness. No significant calf/ankle edema. Labs: Lab Results  Component Value Date   WBC 11.4 (H) 03/15/2018   HGB 8.2 (L) 03/15/2018   HCT 24.1 (L) 03/15/2018   MCV 98.4 03/15/2018   PLT 138 (L) 03/15/2018   CMP Latest Ref Rng & Units 05/11/2017  Glucose 65 - 99 mg/dL 97  BUN 6 - 20 mg/dL 11  Creatinine 2.53 - 6.64 mg/dL 4.03  Sodium 474 - 259 mmol/L 137  Potassium 3.5 - 5.1 mmol/L 4.0  Chloride 101 - 111 mmol/L 102  CO2 22 - 32 mmol/L 26  Calcium 8.9 - 10.3 mg/dL 5.6(L)    Discharge instruction: per After Visit Summary and "Baby and Me Booklet".  After visit meds:  Allergies as of 03/16/2018      Reactions   Latex Swelling, Rash      Medication List    TAKE these medications   acetaminophen 500 MG tablet Commonly known as:  TYLENOL Take 500 mg by mouth every 8 (eight) hours as needed for mild pain or headache.   ibuprofen 600 MG tablet Commonly known as:  ADVIL,MOTRIN Take 1 tablet (600 mg total) by mouth every 6 (six) hours.   PRENATAL PO Take 1 Dose by mouth daily.   TUMS 500 MG chewable tablet Generic drug:  calcium carbonate Chew 2 tablets by mouth 3 (three) times daily as needed for indigestion or heartburn.        Diet: routine diet  Activity: Advance as tolerated. Pelvic rest for 6 weeks.   Outpatient follow up:4 weeks Follow up Appt: Future Appointments  Date Time Provider Department Center  06/23/2018  8:00 AM York SpanielWillis, Charles K, MD [facility] None   Follow up Visit:   Please schedule this patient for Postpartum visit in: 4 weeks with the following provider: Any provider For C/S patients schedule nurse incision check in weeks 2 weeks: no Low risk pregnancy complicated by: None Delivery mode:  SVD Anticipated Birth Control:   Diaphragm PP Procedures needed: None  Schedule Integrated BH visit: no  Newborn Data: Live born female  Birth Weight: 7 lb 0.9 oz (3200 g) APGAR: 9, 9  Newborn Delivery   Birth date/time:  03/14/2018 07:30:00 Delivery type:  Vaginal, Spontaneous     Baby Feeding: Breast Disposition:home with mother   03/15/2018 Awilda MetroSarah A Peiffer, MD   I confirm that I have verified the information documented in the resident's note and that I have also personally reperformed the physical exam and all medical decision making activities. Patient was seen and examined by me also Agree with note Vitals stable Labs stable Fundus firm, lochia within normal limits Perineum healing Ext WNL Ready for discharge  Aviva SignsWilliams, Ressie Slevin L, CNM

## 2018-03-15 NOTE — Lactation Note (Signed)
This note was copied from a baby's chart. Lactation Consultation Note  Patient Name: Jordan [name] XLKGM'WToday's Date: 03/15/2018 Reason for consult: Follow-up assessment;Primapara;1st time breastfeeding;Difficult latch;Early term 37-38.6wks;Infant weight loss  33 hours old early term female who is being exclusively BF by her mother, she's a P1. RN Brooke called LC because baby has not had a good feeding since birth, mom is already double pumping and getting some drops of colostrum, but haven't given those to baby yet. Per RN mom was very emotional, she had tears in her eyes, and she also did during lactation consultation. Praised her for her efforts of providing breast milk for her baby and reassured her that we'll take great care of her and her baby.  Baby was swaddled in dad's arms when entering the room, mom just finished pumping with her DEBP. Offered assistance with latch and mom agreed to wake baby up to feed; mom used a NS #20. LC did some suck training with baby, she has a strong grip but she'll only suck briefly. LC transitioned baby to the left breast in football position and she was able to latch but would not suck consistently, required ongoing stimulation. Baby kept falling asleep, and at the 5 minutes mark, she completely stopped. Traces of colostrum and saliva were observed on the NS.  Discussed some options with mom, her RN had already reviewed the LPI hand out and brought up supplementing baby is she's still sleepy and not feeding well. LC backed up the plan and let mom know that if formula were to be used it will only be temporary while we're still working on BF and until her milk comes in, in sufficient volume. Mom got tearful again, reassure her once more that whether feeding choice she decides, we'll support it.   LC assisted mom with finger feeding, baby took approximately 1 cc of EBM.   Feeding plan:  1. Encouraged mom to keep attempting feeding baby every 3 hours or sooner if  feeding cues are present. 2. Mom will pump every 3 hours and at least once at night and she'll continue feeding baby any amount of EBM she may get 3. Mom will consider supplementting with formula tonight since baby may cluster feed. She's still unsure and will think about it.  Mom will let her next RN that comes at 7 pm know what her feeding option will be or when baby starts showing hunger cues. Baby will need to be reassessed tomorrow morning if no supplementation is still given yet, she's a 3% weight loss now and was born above 7 lbs. Mom reported all questions and concerns were answered, she's aware of LC services and will call PRN.  Maternal Data    Feeding Feeding Type: Breast Fed  LATCH Score Latch: Repeated attempts needed to sustain latch, nipple held in mouth throughout feeding, stimulation needed to elicit sucking reflex.(with NS # 20)  Audible Swallowing: None  Type of Nipple: Everted at rest and after stimulation(mom just finished pumping, nipples looked everted)  Comfort (Breast/Nipple): Soft / non-tender  Hold (Positioning): Assistance needed to correctly position infant at breast and maintain latch.  LATCH Score: 6  Interventions Interventions: Breast feeding basics reviewed;Assisted with latch;Skin to skin;Breast massage;Hand express;Reverse pressure;Breast compression;Adjust position;Support pillows;Expressed milk  Lactation Tools Discussed/Used Tools: Nipple Shields Nipple shield size: 20   Consult Status Consult Status: Follow-up Date: 03/16/18 Follow-up type: In-patient    Jordan [name] 03/15/2018, 5:04 PM

## 2018-03-16 MED ORDER — IBUPROFEN 600 MG PO TABS: 600.0000 mg | ORAL_TABLET | Freq: Four times a day (QID) | ORAL | 0 refills | Status: DC

## 2018-03-16 NOTE — Lactation Note (Signed)
This note was copied from a baby's chart. Lactation Consultation Note  Patient Name: Jordan [name] GEXBM'WToday's Date: 03/16/2018 Reason for consult: Follow-up assessment;Primapara;Early term 37-38.6wks;Infant weight loss  Visited with p1 Mom of 49 hr old baby born at 6995w1d.  Baby at 7% weight loss, with adequate output.    Baby has been sleepy on the breast, but is now waking more to eat.  Baby fussing in crib, and offered to assist/assess baby latched to breast. Mom wearing shells due to edema and flattish nipples. Assisted with pre-pumping using hand pump.   Initiated 24 mm nipple shield.  Instilled 2 ml of colostrum (from prior pumping) into shield.   Baby opened widely and latched in football hold on right breast.  Baby needing stimulation to continue to suck, occasional swallow identified.  Mom given tips on how to keep baby stimulated and alert during feeding.  Baby fed on and off for 20 mins, nippled pulled well into shield, and baby appears contented. Talked about normal newborn sleepiness with early term infants.   Recommended offering formula supplement short term.  Mom prefers not to, as baby spitting formula up.  Engorgement prevention and treatment reviewed.  Mom aware of OP lactation support available to her, encouraged to call.  Requested she call for LC at next feeding to assess prior to discharge.  Plan- 1- Keep baby STS as much as possible 2- <3 hrs, breastfeed baby using nipple shield, and offering EBM+/formula by supplement at breast (20-30 ml today) 3- Pump both breasts 15-20 mins, adding breast massage and hand expression to express as much EBM as possible 4- Follow-up with OP lactation appointment Monday, 03/20/18.  Request made.    Feeding Feeding Type: Breast Fed  LATCH Score Latch: Grasps breast easily, tongue down, lips flanged, rhythmical sucking.  Audible Swallowing: A few with stimulation  Type of Nipple: Everted at rest and after stimulation  Comfort  (Breast/Nipple): Soft / non-tender  Hold (Positioning): Assistance needed to correctly position infant at breast and maintain latch.  LATCH Score: 8  Interventions Interventions: Breast feeding basics reviewed;Assisted with latch;Skin to skin;Breast massage;Hand express;Pre-pump if needed;Breast compression;Adjust position;Support pillows;Position options;Hand pump;DEBP;Shells;Expressed milk  Lactation Tools Discussed/Used Tools: Shells;Nipple Shields;54F feeding tube / Syringe;Pump Nipple shield size: 24 Shell Type: Inverted Breast pump type: Double-Electric Breast Pump   Consult Status Consult Status: Follow-up Date: 03/20/18 Follow-up type: Out-patient    Jordan [name], Jordan [name] 03/16/2018, 9:19 AM

## 2018-03-16 NOTE — Discharge Instructions (Signed)
Vaginal Delivery, Care After °Refer to this sheet in the next few weeks. These instructions provide you with information about caring for yourself after vaginal delivery. Your health care provider may also give you more specific instructions. Your treatment has been planned according to current medical practices, but problems sometimes occur. Call your health care provider if you have any problems or questions. °What can I expect after the procedure? °After vaginal delivery, it is common to have: °· Some bleeding from your vagina. °· Soreness in your abdomen, your vagina, and the area of skin between your vaginal opening and your anus (perineum). °· Pelvic cramps. °· Fatigue. ° °Follow these instructions at home: °Medicines °· Take over-the-counter and prescription medicines only as told by your health care provider. °· If you were prescribed an antibiotic medicine, take it as told by your health care provider. Do not stop taking the antibiotic until it is finished. °Driving ° °· Do not drive or operate heavy machinery while taking prescription pain medicine. °· Do not drive for 24 hours if you received a sedative. °Lifestyle °· Do not drink alcohol. This is especially important if you are breastfeeding or taking medicine to relieve pain. °· Do not use tobacco products, including cigarettes, chewing tobacco, or e-cigarettes. If you need help quitting, ask your health care provider. °Eating and drinking °· Drink at least 8 eight-ounce glasses of water every day unless you are told not to by your health care provider. If you choose to breastfeed your baby, you may need to drink more water than this. °· Eat high-fiber foods every day. These foods may help prevent or relieve constipation. High-fiber foods include: °? Whole grain cereals and breads. °? Brown rice. °? Beans. °? Fresh fruits and vegetables. °Activity °· Return to your normal activities as told by your health care provider. Ask your health care provider  what activities are safe for you. °· Rest as much as possible. Try to rest or take a nap when your baby is sleeping. °· Do not lift anything that is heavier than your baby or 10 lb (4.5 kg) until your health care provider says that it is safe. °· Talk with your health care provider about when you can engage in sexual activity. This may depend on your: °? Risk of infection. °? Rate of healing. °? Comfort and desire to engage in sexual activity. °Vaginal Care °· If you have an episiotomy or a vaginal tear, check the area every day for signs of infection. Check for: °? More redness, swelling, or pain. °? More fluid or blood. °? Warmth. °? Pus or a bad smell. °· Do not use tampons or douches until your health care provider says this is safe. °· Watch for any blood clots that may pass from your vagina. These may look like clumps of dark red, brown, or black discharge. °General instructions °· Keep your perineum clean and dry as told by your health care provider. °· Wear loose, comfortable clothing. °· Wipe from front to back when you use the toilet. °· Ask your health care provider if you can shower or take a bath. If you had an episiotomy or a perineal tear during labor and delivery, your health care provider may tell you not to take baths for a certain length of time. °· Wear a bra that supports your breasts and fits you well. °· If possible, have someone help you with household activities and help care for your baby for at least a few days after   you leave the hospital. °· Keep all follow-up visits for you and your baby as told by your health care provider. This is important. °Contact a health care provider if: °· You have: °? Vaginal discharge that has a bad smell. °? Difficulty urinating. °? Pain when urinating. °? A sudden increase or decrease in the frequency of your bowel movements. °? More redness, swelling, or pain around your episiotomy or vaginal tear. °? More fluid or blood coming from your episiotomy or  vaginal tear. °? Pus or a bad smell coming from your episiotomy or vaginal tear. °? A fever. °? A rash. °? Little or no interest in activities you used to enjoy. °? Questions about caring for yourself or your baby. °· Your episiotomy or vaginal tear feels warm to the touch. °· Your episiotomy or vaginal tear is separating or does not appear to be healing. °· Your breasts are painful, hard, or turn red. °· You feel unusually sad or worried. °· You feel nauseous or you vomit. °· You pass large blood clots from your vagina. If you pass a blood clot from your vagina, save it to show to your health care provider. Do not flush blood clots down the toilet without having your health care provider look at them. °· You urinate more than usual. °· You are dizzy or light-headed. °· You have not breastfed at all and you have not had a menstrual period for 12 weeks after delivery. °· You have stopped breastfeeding and you have not had a menstrual period for 12 weeks after you stopped breastfeeding. °Get help right away if: °· You have: °? Pain that does not go away or does not get better with medicine. °? Chest pain. °? Difficulty breathing. °? Blurred vision or spots in your vision. °? Thoughts about hurting yourself or your baby. °· You develop pain in your abdomen or in one of your legs. °· You develop a severe headache. °· You faint. °· You bleed from your vagina so much that you fill two sanitary pads in one hour. °This information is not intended to replace advice given to you by your health care provider. Make sure you discuss any questions you have with your health care provider. °Document Released: 03/19/2000 Document Revised: 09/03/2015 Document Reviewed: 04/06/2015 °Elsevier Interactive Patient Education © 2018 [employer] ° °

## 2018-03-21 ENCOUNTER — Ambulatory Visit (INDEPENDENT_AMBULATORY_CARE_PROVIDER_SITE_OTHER): Payer: 59 | Admitting: Certified Nurse Midwife

## 2018-03-21 ENCOUNTER — Encounter: Payer: Self-pay | Admitting: Certified Nurse Midwife

## 2018-03-21 DIAGNOSIS — Z029 Encounter for administrative examinations, unspecified: Secondary | ICD-10-CM

## 2018-03-21 NOTE — Progress Notes (Addendum)
   GYNECOLOGY OFFICE VISIT NOTE  History:  27 y.o. G1P1001 here today for follow up of vaginal laceration after SVD 1 week ago. Continues to have daily lochia rubra. Pain is minimal. No complaints. She is breastfeeding. Her delivery was complicated by stellate 2nd degree perineal lac extending into left labia and right periurethral.  Past Medical History:  Diagnosis Date  . Migraines     Past Surgical History:  Procedure Laterality Date  . WISDOM TOOTH EXTRACTION  May 2012    The following portions of the patient's history were reviewed and updated as appropriate: allergies, current medications, past family history, past medical history, past social history, past surgical history and problem list.    Review of Systems:  Genito-Urinary ROS: positive for - VB negative for - urinary frequency/urgency or dysuria   Objective:  Physical Exam BP 112/78   Pulse 92   Ht 4\' 10"  (1.473 m)   Wt 62.1 kg   LMP 06/27/2017   Breastfeeding Yes   BMI 28.63 kg/m  CONSTITUTIONAL: Well-developed, well-nourished female in no acute distress.  HENT:  Normocephalic, atraumatic EYES: Conjunctivae and EOM are normal NECK: Normal range of motion SKIN: Skin is warm and dry NEUROLOGIC: Alert and oriented to person, place, and time PSYCHIATRIC: Normal mood and affect RESPIRATORY: Effort and rate normal PELVIC: periurethral lac well healed, labial lac well approximated with suture, perineal laceration with granulation tissue forming- mostly approximated and hemostatic  MUSCULOSKELETAL: Normal range of motion  Labs and Imaging No results found.  Assessment & Plan:   1. Perineal laceration during delivery, postpartum condition    Sitz bath bid Follow up in 4-5 weeks for postpartum visit  Donette LarryBhambri, Deyra Perdomo, [place] 03/21/2018 4:31 PM

## 2018-04-11 ENCOUNTER — Ambulatory Visit (INDEPENDENT_AMBULATORY_CARE_PROVIDER_SITE_OTHER): Payer: 59 | Admitting: Certified Nurse Midwife

## 2018-04-11 ENCOUNTER — Encounter: Payer: Self-pay | Admitting: Certified Nurse Midwife

## 2018-04-11 NOTE — Progress Notes (Signed)
Post Partum Exam  Jordan [name] is a 28 y.o. G48P1001 female who presents for a postpartum visit. She is 4 weeks postpartum following a spontaneous vaginal delivery. I have fully reviewed the prenatal and intrapartum course. The delivery was at [redacted]w[redacted]d gestational weeks.  Anesthesia: epidural. Postpartum course has been unremarkable. Baby's course has been unremarkable. Baby is feeding by breast. Bleeding brown. Bowel function is normal. Bladder function is normal. Patient is not sexually active. Contraception method planning is diaphragm. Postpartum depression screening:positive  The following portions of the patient's history were reviewed and updated as appropriate: allergies, current medications, past family history and problem list. Last pap smear done 05/2017 and was Normal  Review of Systems Pertinent items noted in HPI and remainder of comprehensive ROS otherwise negative.    Objective:  Last menstrual period 06/27/2017, currently breastfeeding.  General:  alert, cooperative and no distress   Breasts:  inspection negative, no nipple discharge or bleeding, no masses or nodularity palpable  Lungs: clear to auscultation bilaterally  Heart:  regular rate and rhythm, S1, S2 normal, no murmur, click, rub or gallop  Abdomen: soft, non-tender; bowel sounds normal; no masses,  no organomegaly   Vulva:  normal  Vagina: normal vagina, no discharge, exudate, lesion, or erythema and lacerations healing well (stitches in place)  Cervix:  anteverted  Corpus: normal size, contour, position, consistency, mobility, non-tender  Adnexa:  normal adnexa  Rectal Exam: Not performed.        Assessment:    Normal postpartum exam. Pap smear not done at today's visit. Educated and discussed abstaining from intercourse for another 2 weeks while stitches continue to dissolve and heal, patient verbalizes understanding.   Plan:   1. Contraception: diaphragm 2. Follow up in: 1 year for well woman examination or  as needed.

## 2018-06-13 MED FILL — OSELTAMIVIR PHOSPHATE 75 MG: 75 | 7 days supply | Qty: 7 | Fill #0

## 2018-06-23 ENCOUNTER — Ambulatory Visit: Payer: 59 | Admitting: Neurology

## 2018-09-13 ENCOUNTER — Telehealth: Payer: Self-pay | Admitting: *Deleted

## 2018-09-13 NOTE — Telephone Encounter (Signed)
LMVM for pt to return call to r/s appt from 09-19-18 MM/NP to another date and time, but also change to doxy.me Due to current COVID 19 pandemic, our office is severely reducing in office visits until further notice, in order to minimize the risk to our patients and healthcare providers.

## 2018-09-19 ENCOUNTER — Ambulatory Visit: Payer: 59 | Admitting: Adult Health

## 2018-11-13 ENCOUNTER — Encounter: Payer: Self-pay | Admitting: *Deleted

## 2018-11-13 DIAGNOSIS — Z3480 Encounter for supervision of other normal pregnancy, unspecified trimester: Secondary | ICD-10-CM | POA: Insufficient documentation

## 2018-11-21 ENCOUNTER — Other Ambulatory Visit: Payer: 59

## 2018-11-21 ENCOUNTER — Ambulatory Visit (INDEPENDENT_AMBULATORY_CARE_PROVIDER_SITE_OTHER): Payer: 59 | Admitting: Advanced Practice Midwife

## 2018-11-21 ENCOUNTER — Other Ambulatory Visit: Payer: Self-pay

## 2018-11-21 VITALS — BP 106/75 | HR 76 | Wt 124.0 lb

## 2018-11-21 DIAGNOSIS — Z113 Encounter for screening for infections with a predominantly sexual mode of transmission: Secondary | ICD-10-CM | POA: Diagnosis not present

## 2018-11-21 DIAGNOSIS — Z3480 Encounter for supervision of other normal pregnancy, unspecified trimester: Secondary | ICD-10-CM | POA: Diagnosis not present

## 2018-11-21 DIAGNOSIS — O219 Vomiting of pregnancy, unspecified: Secondary | ICD-10-CM

## 2018-11-21 DIAGNOSIS — B3789 Other sites of candidiasis: Secondary | ICD-10-CM | POA: Diagnosis not present

## 2018-11-21 DIAGNOSIS — O09891 Supervision of other high risk pregnancies, first trimester: Secondary | ICD-10-CM

## 2018-11-21 MED ORDER — NYSTATIN 100000 UNIT/GM EX CREA: 1.0000 "application " | TOPICAL_CREAM | Freq: Two times a day (BID) | CUTANEOUS | 1 refills | Status: DC

## 2018-11-21 MED FILL — NYSTATIN 100,000 UNIT/GM CR: 100000 | 30 days supply | Qty: 30 | Fill #0

## 2018-11-21 NOTE — Progress Notes (Signed)
Bedside U/S shows single IUP with FHT of 178 BPM and CRL measures 31.39mm GA 10w,  Small subchorionic hemorrhage noted but patient denies any bleeding.  PNL drawn today and will request NIPTS after 12 weeks

## 2018-11-21 NOTE — Progress Notes (Signed)
Subjective:   Jordan [name] is a 28 y.o. G2P1001 at 2933w0d by early ultrasound being seen today for her first obstetrical visit.  Her obstetrical history is significant for low risk pregnancy. Patient does intend to breast feed. Pregnancy history fully reviewed.  Patient reports occasional nausea during the day. Pt is currently breastfeeding her 218 month old and reporting sharp nipple pain upon latching. Denies erythema, cracking, itching, or tenderness of the breast.    Denies vomiting, abdominal pain, cramping, vaginal bleeding or discharge.    HISTORY: OB History  Gravida Para Term Preterm AB Living  2 1 1  0 0 1  SAB TAB Ectopic Multiple Live Births  0 0 0 0 1    # Outcome Date GA Lbr Len/2nd Weight Sex Delivery Anes PTL Lv  2 Current           1 Term 03/14/18 2910w1d 31:44 / 01:46 3200 g F Vag-Spont EPI  LIV     Name: Jordan [name],GIRL Jordan [name]     Apgar1: 9  Apgar5: 9   Past Medical History:  Diagnosis Date  . Migraines   . PROM (premature rupture of membranes) 03/13/2018  . Supervision of normal first pregnancy, antepartum 08/08/2017    Nursing Staff Provider Office Location  [facility] preferred. No students/residents. Dating   LMP and U/S Language  English Anatomy US   normal female, anterior placenta Flu Vaccine  10/19 [facility] Employee Genetic Screen  NIPS: negative  AFP: neg TDaP vaccine   Given 10/8 Hgb A1C or  GTT Early  Third trimester 2 hour GTT wnl Rhogam   NA   LAB RESULTS  Feeding Plan  Breast Blood Type O/Positive/-   Past Surgical History:  Procedure Laterality Date  . WISDOM TOOTH EXTRACTION  May 2012   Family History  Problem Relation Age of Onset  . Hypertension Father   . Hyperlipidemia Father   . Cancer Maternal Grandmother   . Cancer Maternal Grandfather    Social History   Tobacco Use  . Smoking status: Never Smoker  . Smokeless tobacco: Never Used  Substance Use Topics  . Alcohol use: Not Currently  . Drug use: No   Allergies  Allergen Reactions  .  Latex Swelling and Rash   Current Outpatient Medications on File Prior to Visit  Medication Sig Dispense Refill  . acetaminophen (TYLENOL) 500 MG tablet Take 500 mg by mouth every 8 (eight) hours as needed for mild pain or headache.    . calcium carbonate (TUMS) 500 MG chewable tablet Chew 2 tablets by mouth 3 (three) times daily as needed for indigestion or heartburn.     . Prenatal Vit-Fe Fumarate-FA (PRENATAL PO) Take 1 Dose by mouth daily.     No current facility-administered medications on file prior to visit.     Indications for ASA therapy (per uptodate) One of the following: Previous pregnancy with preeclampsia, especially early onset and with an adverse outcome No Multifetal gestation No Chronic hypertension No Type 1 or 2 diabetes mellitus No Chronic kidney disease No Autoimmune disease (antiphospholipid syndrome, systemic lupus erythematosus) No  Two or more of the following: Nulliparity No Obesity (body mass index >30 kg/m2) No Family history of preeclampsia in mother or sister No Age ?35 years No Sociodemographic characteristics (African American race, low socioeconomic level) No Personal risk factors (eg, previous pregnancy with low birth weight or small for gestational age infant, previous adverse pregnancy outcome [eg, stillbirth], interval >10 years between pregnancies) No  Indications for early 1 hour GTT (per uptodate)  BMI >25 (>23 in Asian women) AND one of the following  Gestational diabetes mellitus in a previous pregnancy No Glycated hemoglobin ?5.7 percent (39 mmol/mol), impaired glucose tolerance, or impaired fasting glucose on previous testing No First-degree relative with diabetes No High-risk race/ethnicity (eg, African American, Latino, Native American, [place] American, Pacific Islander) No History of cardiovascular disease No Hypertension or on therapy for hypertension No High-density lipoprotein cholesterol level <35 mg/dL (4.090.90 mmol/L) and/or a  triglyceride level >250 mg/dL (8.112.82 mmol/L) No Polycystic ovary syndrome No Physical inactivity No Other clinical condition associated with insulin resistance (eg, severe obesity, acanthosis nigricans) No Previous birth of an infant weighing ?4000 g No Previous stillbirth of unknown cause No   Exam   Vitals:   11/21/18 0906  BP: 106/75  Pulse: 76  Weight: 56.2 kg   Fetal Heart Rate (bpm): 178  Uterus:     Pelvic Exam: Perineum: Deferred   Vulva: Deferred   Vagina:  Deferred   Cervix: Deferred; pap smear 05/23/2017    Adnexa: Deferred   Bony Pelvis: Average  System: General: well-developed, well-nourished female in no acute distress   Breast:  normal appearance, no masses or tenderness   Skin: normal coloration and turgor, no rashes   Neurologic: oriented, normal, negative, normal mood   Extremities: normal strength, tone, and muscle mass, ROM of all joints is normal   HEENT PERRLA, extraocular movement intact and sclera clear, anicteric   Mouth/Teeth mucous membranes moist, pharynx normal without lesions and dental hygiene good   Neck supple and no masses   Cardiovascular: regular rate and rhythm   Respiratory:  no respiratory distress, normal breath sounds   Abdomen: soft, non-tender; bowel sounds normal; no masses,  no organomegaly     Assessment:   Pregnancy: G2P1001 Patient Active Problem List   Diagnosis Date Noted  . Supervision of other normal pregnancy, antepartum 11/13/2018  . GBS (group B Streptococcus carrier), +RV culture, currently pregnant 03/11/2018  . Ophthalmoplegic migraine, not intractable 10/08/2016  . B12 deficiency anemia 11/12/2011  . Vitamin D deficiency 11/12/2011     Plan:  1. Supervision of other normal pregnancy, antepartum  - Obstetric panel - HIV antibody (with reflex) - Cervicovaginal ancillary only( [place]) - US bedside; Future - Babyscripts Schedule Optimization - Culture, OB Urine  2. Candidiasis of breast - Pt having  sharp nipple pain when breastfeeding 698 month old - Discussed r/b of using nystatin cream while breastfeeding with patient - Pt would like to try nystatin cream   - nystatin cream (MYCOSTATIN); Apply 1 application topically 2 (two) times daily.  Dispense: 30 g; Refill: 1  3. Nausea and vomiting during pregnancy prior to [redacted] weeks gestation - Pt does not want medication - Education on diet to improve nausea discussed with patient - Pt to call the office if nausea worsens or develops vomiting.    Initial labs drawn. Continue prenatal vitamins. Genetic Screening discussed, NIPS: requested. Will do at next OB visit Ultrasound discussed; fetal anatomic survey: not done.  Problem list reviewed and updated The nature of [facility] with multiple MDs and other Advanced Practice Providers was explained to patient; also emphasized that fellows, residents, and students are part of our team. Routine obstetric precautions reviewed No follow-ups on file.   - Pt to follow up in 4 weeks for ROB and NIPS, or as needed.    Brand Malesanielle L Daniah Zaldivar, SNM 10:17  AM 11/21/18

## 2018-11-22 LAB — OBSTETRIC PANEL
Absolute Monocytes: 391 cells/uL (ref 200–950)
Antibody Screen: NOT DETECTED
Basophils Absolute: 28 cells/uL (ref 0–200)
Basophils Relative: 0.4 %
Eosinophils Absolute: 57 cells/uL (ref 15–500)
Eosinophils Relative: 0.8 %
HCT: 39.1 % (ref 35.0–45.0)
Hemoglobin: 13 g/dL (ref 11.7–15.5)
Hepatitis B Surface Ag: NONREACTIVE
Lymphs Abs: 1690 cells/uL (ref 850–3900)
MCH: 32.3 pg (ref 27.0–33.0)
MCHC: 33.2 g/dL (ref 32.0–36.0)
MCV: 97 fL (ref 80.0–100.0)
MPV: 11.3 fL (ref 7.5–12.5)
Monocytes Relative: 5.5 %
Neutro Abs: 4935 cells/uL (ref 1500–7800)
Neutrophils Relative %: 69.5 %
Platelets: 233 10*3/uL (ref 140–400)
RBC: 4.03 10*6/uL (ref 3.80–5.10)
RDW: 12 % (ref 11.0–15.0)
RPR Ser Ql: NONREACTIVE
Rubella: 1.22 index
Total Lymphocyte: 23.8 %
WBC: 7.1 10*3/uL (ref 3.8–10.8)

## 2018-11-22 LAB — CERVICOVAGINAL ANCILLARY ONLY
Chlamydia: NEGATIVE
Neisseria Gonorrhea: NEGATIVE

## 2018-11-22 LAB — HIV ANTIBODY (ROUTINE TESTING W REFLEX): HIV 1&2 Ab, 4th Generation: NONREACTIVE

## 2018-11-25 LAB — URINE CULTURE, OB REFLEX

## 2018-11-25 LAB — CULTURE, OB URINE

## 2018-11-30 ENCOUNTER — Encounter: Payer: Self-pay | Admitting: *Deleted

## 2018-11-30 ENCOUNTER — Other Ambulatory Visit: Payer: Self-pay | Admitting: Advanced Practice Midwife

## 2018-11-30 ENCOUNTER — Encounter: Payer: Self-pay | Admitting: Advanced Practice Midwife

## 2018-11-30 DIAGNOSIS — O2341 Unspecified infection of urinary tract in pregnancy, first trimester: Secondary | ICD-10-CM

## 2018-11-30 DIAGNOSIS — O99011 Anemia complicating pregnancy, first trimester: Secondary | ICD-10-CM

## 2018-11-30 DIAGNOSIS — B951 Streptococcus, group B, as the cause of diseases classified elsewhere: Secondary | ICD-10-CM

## 2018-11-30 MED ORDER — INTEGRA F 125-1 MG PO CAPS: 1.0000 | ORAL_CAPSULE | Freq: Every day | ORAL | 5 refills | Status: DC

## 2018-11-30 MED ORDER — CEPHALEXIN 500 MG PO CAPS: 500.0000 mg | ORAL_CAPSULE | Freq: Three times a day (TID) | ORAL | 0 refills | Status: DC

## 2018-11-30 MED FILL — CEPHALEXIN 500 MG CAPSULE: 500 | 7 days supply | Qty: 21 | Fill #0

## 2018-11-30 MED FILL — FOLIVANE-F CAPSULE: 125-1 | 30 days supply | Qty: 30 | Fill #0

## 2018-11-30 NOTE — Progress Notes (Signed)
Anemia and GBS UTI in pregnancy. Rx for Integra daily and Keflex TID x 7 days sent to pharmacy.

## 2018-12-19 ENCOUNTER — Encounter: Payer: Self-pay | Admitting: Certified Nurse Midwife

## 2018-12-19 ENCOUNTER — Other Ambulatory Visit: Payer: Self-pay

## 2018-12-19 ENCOUNTER — Ambulatory Visit (INDEPENDENT_AMBULATORY_CARE_PROVIDER_SITE_OTHER): Payer: 59 | Admitting: Certified Nurse Midwife

## 2018-12-19 VITALS — BP 105/66 | HR 84 | Wt 125.0 lb

## 2018-12-19 DIAGNOSIS — O99820 Streptococcus B carrier state complicating pregnancy: Secondary | ICD-10-CM

## 2018-12-19 DIAGNOSIS — O2341 Unspecified infection of urinary tract in pregnancy, first trimester: Secondary | ICD-10-CM | POA: Diagnosis not present

## 2018-12-19 DIAGNOSIS — B951 Streptococcus, group B, as the cause of diseases classified elsewhere: Secondary | ICD-10-CM | POA: Diagnosis not present

## 2018-12-19 DIAGNOSIS — Z3482 Encounter for supervision of other normal pregnancy, second trimester: Secondary | ICD-10-CM | POA: Diagnosis not present

## 2018-12-19 DIAGNOSIS — Z3480 Encounter for supervision of other normal pregnancy, unspecified trimester: Secondary | ICD-10-CM

## 2018-12-19 DIAGNOSIS — Z3A14 14 weeks gestation of pregnancy: Secondary | ICD-10-CM

## 2018-12-19 DIAGNOSIS — Z31430 Encounter of female for testing for genetic disease carrier status for procreative management: Secondary | ICD-10-CM | POA: Diagnosis not present

## 2018-12-19 NOTE — Patient Instructions (Signed)

## 2018-12-19 NOTE — Progress Notes (Signed)
   PRENATAL VISIT NOTE  Subjective:  Jordan [name] is a 28 y.o. G2P1001 at [redacted]w[redacted]d being seen today for ongoing prenatal care. She is currently monitored for the following issues for this low-risk pregnancy and has Ophthalmoplegic migraine, not intractable; B12 deficiency anemia; Vitamin D deficiency; Supervision of other normal pregnancy, antepartum; Short interval between pregnancies affecting pregnancy in first trimester, antepartum; Anemia complicating pregnancy in first trimester; and GBS (group b Streptococcus) UTI complicating pregnancy, first trimester on their problem list.  Patient reports nausea but no vomiting. She does have concerns and questions about subchorionic hemorrhage from ultrasound at first visit. She denies abdominal/pelvic pain, vaginal bleeding, or leaking of fluid Vag. Bleeding: None.  Movement: Absent.   The following portions of the patient's history were reviewed and updated as appropriate: allergies, current medications, past family history, past medical history, past social history, past surgical history and problem list.   Objective:   Vitals:   12/19/18 1434  BP: 105/66  Pulse: 84  Weight: 56.7 kg    Fetal Status: Fetal Heart Rate (bpm): 155   Movement: Absent     General:  Alert, oriented and cooperative. Patient is in no acute distress.  Skin: Skin is warm and dry. No rash noted.   Cardiovascular: Normal heart rate noted  Respiratory: Normal respiratory effort, no problems with respiration noted  Abdomen: Soft, gravid, appropriate for gestational age.        Pelvic: Cervical exam deferred        Extremities: Normal range of motion.  Edema: None  Mental Status: Normal mood and affect. Normal behavior. Normal judgment and thought content.   Assessment and Plan:  Pregnancy: G2P1001 at [redacted]w[redacted]d 1. Supervision of other normal pregnancy, antepartum - Routine prenatal care - Pt reports ongoing intermittent nausea. Declines medication. - Discussed normal  physiology of pregnancy with patient - Genetic screening today - Anatomy scan ordered today - Anticipatory guidance for upcoming appointments and visits   - [place] MFM OB COMP + 14 WK; Future - Genetic Screening  2. GBS (group b Streptococcus) UTI complicating pregnancy, first trimester - Pt reports she completed antibiotic for UTI - TOC today - Educated pt on GBS and the need for PCN during labor  - Culture, OB Urine  Preterm labor symptoms and general obstetric precautions including but not limited to vaginal bleeding, contractions, leaking of fluid and fetal movement were reviewed in detail with the patient. Please refer to After Visit Summary for other counseling recommendations.   Return in about 4 weeks (around 01/16/2019) for ROB-AFP.  Future Appointments  Date Time Provider [place]  01/01/2019  9:00 AM Ward Givens, NP [facility] None    Renee Harder, Student-MidWife

## 2018-12-22 LAB — CULTURE, OB URINE

## 2018-12-22 LAB — AFP, SERUM, OPEN SPINA BIFIDA
AFP MoM: 1.17
AFP Value: 37.5 ng/mL
Gest. Age on Collection Date: 15 weeks
Maternal Age At EDD: 27.2 yr
OSBR Risk 1 IN: 7137
Test Results:: NEGATIVE
Weight: 125 [lb_av]

## 2018-12-22 LAB — URINE CULTURE, OB REFLEX

## 2018-12-23 MED ORDER — AMOXICILLIN 500 MG PO CAPS: 500.0000 mg | ORAL_CAPSULE | Freq: Three times a day (TID) | ORAL | 0 refills | Status: AC

## 2018-12-23 NOTE — Addendum Note (Signed)
Addended by: Lajean Manes on: 12/23/2018 08:33 AM   Modules accepted: Orders

## 2018-12-25 MED FILL — AMOXICILLIN 500 MG CAPSULE: 500 | 7 days supply | Qty: 21 | Fill #0

## 2019-01-01 ENCOUNTER — Ambulatory Visit: Payer: 59 | Admitting: Adult Health

## 2019-01-01 ENCOUNTER — Encounter: Payer: Self-pay | Admitting: Adult Health

## 2019-01-01 ENCOUNTER — Encounter: Payer: Self-pay | Admitting: *Deleted

## 2019-01-01 ENCOUNTER — Other Ambulatory Visit: Payer: Self-pay

## 2019-01-01 VITALS — BP 106/73 | HR 83 | Temp 97.5°F | Ht <= 58 in | Wt 126.2 lb

## 2019-01-01 DIAGNOSIS — G43119 Migraine with aura, intractable, without status migrainosus: Secondary | ICD-10-CM

## 2019-01-01 NOTE — Progress Notes (Signed)
PATIENT: Jordan [name] DOB: [birth year]  REASON FOR VISIT: follow up HISTORY FROM: patient  HISTORY OF PRESENT ILLNESS: Today 01/01/19:  Jordan [name] is a 28 year old female with a history of migraine headaches.  She returns today for follow-up.  She delivered in [place] baby girl.  She states that delivery went well and baby is healthy.  She is now [redacted] weeks pregnant-this was unplanned.  She states that her headaches are slightly worse during this pregnancy than the last.  She states that she has about 4-5 headaches a week.  However the headaches are manageable.  She states that in the last 9 months she is only had 3-4 actual migraine headaches.  She states that she typically can take Tylenol and that decreases the headache severity.  She does not wish to be on any additional medication at this time.  HISTORY  12/21/17:  Jordan [name] is a 28 year old female with a history of migraine headaches.  She returns today for follow-up.  She reports that she is [redacted] weeks pregnant.  She has stopped her migraine medication.  She reports that her headaches have actually improved.  She has approximately 1-2 headaches a month.  They continue to occur on the left side.  She does have photophobia, phonophobia and nausea.  She is been taking Tylenol for this severe headaches.  Overall she feels that her headaches are manageable at this time.  She returns today for evaluation  REVIEW OF SYSTEMS: Out of a complete 14 system review of symptoms, the patient complains only of the following symptoms, and all other reviewed systems are negative.  See HPI  ALLERGIES: Allergies  Allergen Reactions  . Latex Swelling and Rash    HOME MEDICATIONS: Outpatient Medications Prior to Visit  Medication Sig Dispense Refill  . acetaminophen (TYLENOL) 500 MG tablet Take 500 mg by mouth every 8 (eight) hours as needed for mild pain or headache.    . calcium carbonate (TUMS) 500 MG chewable tablet Chew 2 tablets by  mouth 3 (three) times daily as needed for indigestion or heartburn.     . Fe Fum-FePoly-FA-Vit C-Vit B3 (INTEGRA F) 125-1 MG CAPS Take 1 capsule by mouth daily. 30 capsule 5  . nystatin cream (MYCOSTATIN) Apply 1 application topically 2 (two) times daily. 30 g 1  . Prenatal Vit-Fe Fumarate-FA (PRENATAL PO) Take 1 Dose by mouth daily.     No facility-administered medications prior to visit.     PAST MEDICAL HISTORY: Past Medical History:  Diagnosis Date  . Migraines   . PROM (premature rupture of membranes) 03/13/2018  . Supervision of normal first pregnancy, antepartum 08/08/2017    Nursing Staff Provider Office Location  [place] preferred. No students/residents. Dating   LMP and U/S Language  English Anatomy US   normal female, anterior placenta Flu Vaccine  10/19 [employer] Employee Genetic Screen  NIPS: negative  AFP: neg TDaP vaccine   Given 10/8 Hgb A1C or  GTT Early  Third trimester 2 hour GTT wnl Rhogam   NA   LAB RESULTS  Feeding Plan  Breast Blood Type O/Positive/-    PAST SURGICAL HISTORY: Past Surgical History:  Procedure Laterality Date  . WISDOM TOOTH EXTRACTION  May 2012    FAMILY HISTORY: Family History  Problem Relation Age of Onset  . Hypertension Father   . Hyperlipidemia Father   . Cancer Maternal Grandmother   . Cancer Maternal Grandfather     SOCIAL HISTORY: Social History   Socioeconomic History  .  Marital status: Married    Spouse name: Minerva Areola  . Number of children: 0  . Years of education: BSN  . Highest education level: Not on file  Occupational History  . 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 Financial resource strain: Not on file  . Food insecurity    Worry: Not on file    Inability: Not on file  . Transportation needs    Medical: Not on file    Non-medical: Not on file  Tobacco Use  . Smoking status: Never Smoker  . Smokeless tobacco: Never Used  Substance and Sexual Activity  . Alcohol use: Not Currently  . Drug use: No  .  Sexual activity: Yes    Birth control/protection: Diaphragm  Lifestyle  . Physical activity    Days per week: Not on file    Minutes per session: Not on file  . Stress: Not on file  Relationships  . Social Musician on phone: Not on file    Gets together: Not on file    Attends religious service: Not on file    Active member of club or organization: Not on file    Attends meetings of clubs or organizations: Not on file    Relationship status: Not on file  . Intimate partner violence    Fear of current or ex partner: Not on file    Emotionally abused: Not on file    Physically abused: Not on file    Forced sexual activity: Not on file  Other Topics Concern  . Not on file  Social History Narrative   Nurse at [employer]   Lives with roommate   Caffeine use: 3-4 times per week Neita Carp)   Right handed      PHYSICAL EXAM  Vitals:   01/01/19 0841  BP: 106/73  Pulse: 83  Temp: (!) 97.5 F (36.4 C)  Weight: 126 lb 3.2 oz (57.2 kg)  Height: 4\' 10"  (1.473 m)   Body mass index is 26.38 kg/m.  Generalized: Well developed, in no acute distress   Neurological examination  Mentation: Alert oriented to time, place, history taking. Follows all commands speech and language fluent Cranial nerve II-XII: Pupils were equal round reactive to light. Extraocular movements were full, visual field were full on confrontational test.  Head turning and shoulder shrug  were normal and symmetric. Motor: The motor testing reveals 5 over 5 strength of all 4 extremities. Good symmetric motor tone is noted throughout.  Sensory: Sensory testing is intact to soft touch on all 4 extremities. No evidence of extinction is noted.  Coordination: Cerebellar testing reveals good finger-nose-finger and heel-to-shin bilaterally.  Gait and station: Gait is normal..  Reflexes: Deep tendon reflexes are symmetric and normal bilaterally.   DIAGNOSTIC DATA (LABS, IMAGING, TESTING) - I reviewed patient  records, labs, notes, testing and imaging myself where available.  Lab Results  Component Value Date   WBC 7.1 11/21/2018   HGB 13.0 11/21/2018   HCT 39.1 11/21/2018   MCV 97.0 11/21/2018   PLT 233 11/21/2018      Component Value Date/Time   NA 137 05/11/2017 1844   K 4.0 05/11/2017 1844   CL 102 05/11/2017 1844   CO2 26 05/11/2017 1844   GLUCOSE 97 05/11/2017 1844   BUN 11 05/11/2017 1844   CREATININE 0.76 05/11/2017 1844   CALCIUM 8.7 (L) 05/11/2017 1844   GFRNONAA >60 05/11/2017 1844   GFRAA >60 05/11/2017 1844   No  results found for: CHOL, HDL, LDLCALC, LDLDIRECT, TRIG, CHOLHDL No results found for: VWUJ8JHGBA1C No results found for: VITAMINB12 Lab Results  Component Value Date   TSH 3.71 12/23/2015      ASSESSMENT AND PLAN 28 y.o. year old female  has a past medical history of Migraines, PROM (premature rupture of membranes) (03/13/2018), and Supervision of normal first pregnancy, antepartum (08/08/2017). here with:  1.  Migraine headaches 2.  [redacted] weeks pregnant  Overall the patient is doing well.  She does have several headaches a week but feels that it is manageable with Tylenol.  She does not wish to start any new medication at this time.  I have advised that if her symptoms worsen or she develops new symptoms she should let us know.  She will follow-up in 8 months or sooner if needed.   I spent 15 minutes with the patient. 50% of this time was spent reviewing treatment options.   Butch PennyMegan Ginette Bradway, MSN, NP-C 01/01/2019, 8:44 AM [facility] [address], [address] [place], [place] [address] [phone]

## 2019-01-01 NOTE — Progress Notes (Signed)
I have read the note, and I agree with the clinical assessment and plan.  Tamanna Whitson K Yeng Perz   

## 2019-01-01 NOTE — Patient Instructions (Signed)
Your Plan:  Continue to monitor headaches If your symptoms worsen or you develop new symptoms please let us know.   Thank you for coming to see [place] at [facility]. I hope we have been able to provide you high quality care today.  You may receive a patient satisfaction survey over the next few weeks. We would appreciate your feedback and comments so that we may continue to improve ourselves and the health of our patients.

## 2019-01-19 ENCOUNTER — Ambulatory Visit (INDEPENDENT_AMBULATORY_CARE_PROVIDER_SITE_OTHER): Payer: 59 | Admitting: Obstetrics and Gynecology

## 2019-01-19 ENCOUNTER — Other Ambulatory Visit: Payer: Self-pay

## 2019-01-19 ENCOUNTER — Ambulatory Visit (HOSPITAL_COMMUNITY)
Admission: RE | Admit: 2019-01-19 | Discharge: 2019-01-19 | Disposition: A | Discharge status: Home / Self Care | Payer: 59 | Source: Ambulatory Visit | Attending: Obstetrics and Gynecology | Admitting: Obstetrics and Gynecology

## 2019-01-19 ENCOUNTER — Other Ambulatory Visit (HOSPITAL_COMMUNITY): Payer: Self-pay | Admitting: *Deleted

## 2019-01-19 DIAGNOSIS — Z3480 Encounter for supervision of other normal pregnancy, unspecified trimester: Secondary | ICD-10-CM

## 2019-01-19 DIAGNOSIS — Z363 Encounter for antenatal screening for malformations: Secondary | ICD-10-CM | POA: Diagnosis not present

## 2019-01-19 DIAGNOSIS — Z3A19 19 weeks gestation of pregnancy: Secondary | ICD-10-CM

## 2019-01-19 DIAGNOSIS — Z3482 Encounter for supervision of other normal pregnancy, second trimester: Secondary | ICD-10-CM

## 2019-01-19 DIAGNOSIS — Z3689 Encounter for other specified antenatal screening: Secondary | ICD-10-CM

## 2019-01-19 NOTE — Progress Notes (Signed)
   PRENATAL VISIT NOTE  Subjective:  Jordan [name] is a 28 y.o. G2P1001 at [redacted]w[redacted]d being seen today for ongoing prenatal care.  She is currently monitored for the following issues for this low-risk pregnancy and has Ophthalmoplegic migraine, not intractable; B12 deficiency anemia; Vitamin D deficiency; Supervision of other normal pregnancy, antepartum; Short interval between pregnancies affecting pregnancy in first trimester, antepartum; Anemia complicating pregnancy in first trimester; and GBS (group b Streptococcus) UTI complicating pregnancy, first trimester on their problem list.  Patient reports no complaints.  Contractions: Not present. Vag. Bleeding: None.  Movement: Absent. Denies leaking of fluid.   The following portions of the patient's history were reviewed and updated as appropriate: allergies, current medications, past family history, past medical history, past social history, past surgical history and problem list.   Objective:   Vitals:   01/19/19 1029  BP: 108/74  Pulse: 82  Weight: 129 lb (58.5 kg)    Fetal Status: Fetal Heart Rate (bpm): 148 Fundal Height: 20 cm Movement: Absent     General:  Alert, oriented and cooperative. Patient is in no acute distress.  Skin: Skin is warm and dry. No rash noted.   Cardiovascular: Normal heart rate noted  Respiratory: Normal respiratory effort, no problems with respiration noted  Abdomen: Soft, gravid, appropriate for gestational age.  Pain/Pressure: Absent     Pelvic: Cervical exam deferred        Extremities: Normal range of motion.  Edema: None  Mental Status: Normal mood and affect. Normal behavior. Normal judgment and thought content.   Assessment and Plan:  Pregnancy: G2P1001 at [redacted]w[redacted]d 1. Supervision of other normal pregnancy, antepartum  She is breast feeding currently, discussed importance of calorie intake  AFP negative   Preterm labor symptoms and general obstetric precautions including but not limited to vaginal  bleeding, contractions, leaking of fluid and fetal movement were reviewed in detail with the patient. Please refer to After Visit Summary for other counseling recommendations.   No follow-ups on file.  Future Appointments  Date Time Provider [place]  02/16/2019  7:45 AM WH-MFC [place] 2 WH-MFCUS MFC-US  02/16/2019  8:45 AM Emily Filbert, MD [facility]  08/27/2019  8:30 AM Ward Givens, NP [facility] None    Noni Saupe, NP

## 2019-01-19 NOTE — Patient Instructions (Signed)

## 2019-02-16 ENCOUNTER — Other Ambulatory Visit (HOSPITAL_COMMUNITY): Payer: Self-pay | Admitting: *Deleted

## 2019-02-16 ENCOUNTER — Other Ambulatory Visit: Payer: Self-pay

## 2019-02-16 ENCOUNTER — Telehealth (INDEPENDENT_AMBULATORY_CARE_PROVIDER_SITE_OTHER): Payer: 59 | Admitting: Obstetrics & Gynecology

## 2019-02-16 ENCOUNTER — Ambulatory Visit (HOSPITAL_COMMUNITY)
Admission: RE | Admit: 2019-02-16 | Discharge: 2019-02-16 | Disposition: A | Discharge status: Home / Self Care | Payer: 59 | Source: Ambulatory Visit | Attending: Obstetrics and Gynecology | Admitting: Obstetrics and Gynecology

## 2019-02-16 ENCOUNTER — Other Ambulatory Visit (HOSPITAL_COMMUNITY): Payer: Self-pay | Admitting: Obstetrics and Gynecology

## 2019-02-16 VITALS — BP 106/71 | Wt 133.0 lb

## 2019-02-16 DIAGNOSIS — Z3A23 23 weeks gestation of pregnancy: Secondary | ICD-10-CM

## 2019-02-16 DIAGNOSIS — O99011 Anemia complicating pregnancy, first trimester: Secondary | ICD-10-CM

## 2019-02-16 DIAGNOSIS — Z3689 Encounter for other specified antenatal screening: Secondary | ICD-10-CM | POA: Insufficient documentation

## 2019-02-16 DIAGNOSIS — O365921 Maternal care for other known or suspected poor fetal growth, second trimester, fetus 1: Secondary | ICD-10-CM

## 2019-02-16 DIAGNOSIS — Z362 Encounter for other antenatal screening follow-up: Secondary | ICD-10-CM

## 2019-02-16 DIAGNOSIS — O365990 Maternal care for other known or suspected poor fetal growth, unspecified trimester, not applicable or unspecified: Secondary | ICD-10-CM

## 2019-02-16 DIAGNOSIS — Z3480 Encounter for supervision of other normal pregnancy, unspecified trimester: Secondary | ICD-10-CM

## 2019-02-16 DIAGNOSIS — O2342 Unspecified infection of urinary tract in pregnancy, second trimester: Secondary | ICD-10-CM

## 2019-02-16 DIAGNOSIS — E559 Vitamin D deficiency, unspecified: Secondary | ICD-10-CM

## 2019-02-16 DIAGNOSIS — B951 Streptococcus, group B, as the cause of diseases classified elsewhere: Secondary | ICD-10-CM

## 2019-02-16 DIAGNOSIS — O99012 Anemia complicating pregnancy, second trimester: Secondary | ICD-10-CM

## 2019-02-16 NOTE — Progress Notes (Signed)
TELEHEALTH OBSTETRICS PRENATAL VIRTUAL VIDEO VISIT ENCOUNTER NOTE  Provider location: [facility] [place]   I connected with Jordan [name] on 02/16/19 at 10:45 AM EST by MyChart Video Encounter at home and verified that I am speaking with the correct person using two identifiers.   I discussed the limitations, risks, security and privacy concerns of performing an evaluation and management service virtually and the availability of in person appointments. I also discussed with the patient that there may be a patient responsible charge related to this service. The patient expressed understanding and agreed to proceed. Subjective:  Jordan [name] is a 28 y.o. G2P1001 at [redacted]w[redacted]d being seen today for ongoing prenatal care.  She is currently monitored for the following issues for this low-risk pregnancy and has Ophthalmoplegic migraine, not intractable; B12 deficiency anemia; Vitamin D deficiency; Supervision of other normal pregnancy, antepartum; Short interval between pregnancies affecting pregnancy in first trimester, antepartum; Anemia complicating pregnancy in first trimester; and GBS (group b Streptococcus) UTI complicating pregnancy, first trimester on their problem list.  Patient reports no complaints.  Contractions: Not present. Vag. Bleeding: None.  Movement: Present. Denies any leaking of fluid.   The following portions of the patient's history were reviewed and updated as appropriate: allergies, current medications, past family history, past medical history, past social history, past surgical history and problem list.   Objective:   Vitals:   02/16/19 0925  BP: 106/71  Weight: 133 lb (60.3 kg)    Fetal Status:     Movement: Present     General:  Alert, oriented and cooperative. Patient is in no acute distress.  Respiratory: Normal respiratory effort, no problems with respiration noted  Mental Status: Normal mood and affect. Normal behavior. Normal judgment  and thought content.  Rest of physical exam deferred due to type of encounter  Imaging: [place] Mfm Ob Comp + 14 Wk  Result Date: 01/19/2019 ----------------------------------------------------------------------  OBSTETRICS REPORT                       (Signed Final 01/19/2019 10:14 am) ---------------------------------------------------------------------- Patient Info  ID #:       [id number]                          D.O.B.:  10/13/1990 (27 yrs)  Name:       Jordan [name]                  Visit Date: 01/19/2019 09:20 am ---------------------------------------------------------------------- Performed By  Performed By:     Eden Lathe BS      Ref. Address:     [address]                    [facility]                                                             [place], [place]  Attending:        Noralee Space MD        [facility]  Referred By:      [facility] ---------------------------------------------------------------------- Orders   #  Description                          Code         Ordered By   1  US MFM OB COMP + 14 WK               76805.01     VERONICA ROGERS  ----------------------------------------------------------------------   #  Order #                    Accession #                 Episode #   1  [id number]                  [id number]  ---------------------------------------------------------------------- Indications   Antenatal screening for malformations (low     Z36.3   risk NIPS)   [redacted] weeks gestation of pregnancy                Z3A.19  ---------------------------------------------------------------------- Fetal Evaluation  Num Of Fetuses:         1  Fetal Heart Rate(bpm):  148  Cardiac Activity:       Observed  Presentation:           Variable  Placenta:               Posterior  P. Cord Insertion:      Visualized  Amniotic Fluid  AFI FV:      Within normal limits                               Largest Pocket(cm)                              4.7 ---------------------------------------------------------------------- Biometry  BPD:      43.1  mm     G. Age:  19w 0d         46  %    CI:        74.74   %    70 - 86                                                          FL/HC:      16.0   %    16.1 - 18.3  HC:      158.2  mm     G. Age:  18w 5d         22  %    HC/AC:      1.23        1.09 - 1.39  AC:      128.2  mm     G. Age:  18w 3d         22  %    FL/BPD:     58.7   %  FL:       25.3  mm     G.  Age:  17w 5d          5  %    FL/AC:      19.7   %    20 - 24  HUM:      25.7  mm     G. Age:  18w 0d         21  %  CER:      19.2  mm     G. Age:  18w 4d         35  %  CM:        4.7  mm  Est. FW:     225  gm      0 lb 8 oz      6  % ---------------------------------------------------------------------- OB History  Gravidity:    2         Term:   1        Prem:   0        SAB:   0  TOP:          0       Ectopic:  0        Living: 1 ---------------------------------------------------------------------- Gestational Age  LMP:           19w 1d        Date:  09/07/18                 EDD:   06/14/19  U/S Today:     18w 3d                                        EDD:   06/19/19  Best:          19w 1d     Det. By:  LMP  (09/07/18)          EDD:   06/14/19 ---------------------------------------------------------------------- Anatomy  Cranium:               Appears normal         LVOT:                   Appears normal  Cavum:                 Appears normal         Aortic Arch:            Appears normal  Ventricles:            Appears normal         Ductal Arch:            Appears normal  Choroid Plexus:        Appears normal         Diaphragm:              Appears normal  Cerebellum:            Appears normal         Stomach:                Appears normal, left  sided  Posterior Fossa:       Appears normal         Abdomen:                 Appears normal  Nuchal Fold:           Appears normal         Abdominal Wall:         Appears nml (cord                                                                        insert, abd wall)  Face:                  Appears normal         Cord Vessels:           Appears normal (3                         (orbits and profile)                           vessel cord)  Lips:                  Appears normal         Kidneys:                Appear normal  Palate:                Appears normal         Bladder:                Appears normal  Thoracic:              Appears normal         Spine:                  Appears normal  Heart:                 Appears normal         Upper Extremities:      Appears normal                         (4CH, axis, and                         situs)  RVOT:                  Appears normal         Lower Extremities:      Appears normal  Other:  Heels/feet and open hands/5th digits visualized. Nasal bone          visualized. ---------------------------------------------------------------------- Cervix Uterus Adnexa  Cervix  Length:            3.3  cm.  Normal appearance by transabdominal scan.  Uterus  No abnormality visualized.  Left Ovary  Within normal limits.  Right Ovary  Within normal limits.  Cul De Sac  No free fluid seen.  Adnexa  No abnormality visualized. ---------------------------------------------------------------------- Impression  We  performed fetal anatomy scan. No makers of  aneuploidies or fetal structural defects are seen. Fetal  biometry is consistent with her previously-established dates.  Amniotic fluid is normal and good fetal activity is seen.  Patient understands the limitations of ultrasound in detecting  fetal anomalies.  On cell-free fetal DNA screening, the risks of fetal  aneuploidies are not increased. MSAFP screening showed  low risk for open-neural tube defects. ---------------------------------------------------------------------- Recommendations   -An appointment was made for her to return in 4 weeks for  interval fetal growth assessment. ----------------------------------------------------------------------                  Noralee Space, MD Electronically Signed Final Report   01/19/2019 10:14 am ----------------------------------------------------------------------   Assessment and Plan:  Pregnancy: G2P1001 at [redacted]w[redacted]d 1. Anemia complicating pregnancy in first trimester - I encouraged her to take an iron pill  2. GBS (group b Streptococcus) UTI complicating pregnancy, first trimester - treat in labor  3. Supervision of other normal pregnancy, antepartum - 2 hour GTT at next visit  4. Possible growth restriction- she had a MFM u/s today but results are not visible. She has a follow up scheduled in 3 weeks  Preterm labor symptoms and general obstetric precautions including but not limited to vaginal bleeding, contractions, leaking of fluid and fetal movement were reviewed in detail with the patient. I discussed the assessment and treatment plan with the patient. The patient was provided an opportunity to ask questions and all were answered. The patient agreed with the plan and demonstrated an understanding of the instructions. The patient was advised to call back or seek an in-person office evaluation/go to [facility] at [name] for any urgent or concerning symptoms. Please refer to After Visit Summary for other counseling recommendations.   I provided 10 minutes of face-to-face time during this encounter.  No follow-ups on file.  Future Appointments  Date Time Provider Department Center  02/16/2019 10:45 AM Allie Bossier, MD [facility]  03/09/2019  8:30 AM WH-MFC [place] 1 WH-MFCUS MFC-US  03/09/2019  8:40 AM WH-MFC NURSE WH-MFC MFC-US  08/27/2019  8:30 AM Butch Penny, NP [facility] None    Allie Bossier, MD Center for [facility], [facility]

## 2019-03-09 ENCOUNTER — Ambulatory Visit (HOSPITAL_COMMUNITY): Payer: 59 | Admitting: *Deleted

## 2019-03-09 ENCOUNTER — Other Ambulatory Visit: Payer: Self-pay

## 2019-03-09 ENCOUNTER — Other Ambulatory Visit (HOSPITAL_COMMUNITY): Payer: Self-pay | Admitting: Obstetrics and Gynecology

## 2019-03-09 ENCOUNTER — Encounter (HOSPITAL_COMMUNITY): Payer: Self-pay

## 2019-03-09 ENCOUNTER — Other Ambulatory Visit (HOSPITAL_COMMUNITY): Payer: Self-pay | Admitting: *Deleted

## 2019-03-09 ENCOUNTER — Ambulatory Visit (HOSPITAL_COMMUNITY)
Admission: RE | Admit: 2019-03-09 | Discharge: 2019-03-09 | Disposition: A | Discharge status: Home / Self Care | Payer: 59 | Source: Ambulatory Visit | Attending: Obstetrics and Gynecology | Admitting: Obstetrics and Gynecology

## 2019-03-09 VITALS — BP 112/66 | HR 84 | Temp 97.2°F

## 2019-03-09 DIAGNOSIS — O368320 Maternal care for abnormalities of the fetal heart rate or rhythm, second trimester, not applicable or unspecified: Secondary | ICD-10-CM

## 2019-03-09 DIAGNOSIS — Z3480 Encounter for supervision of other normal pregnancy, unspecified trimester: Secondary | ICD-10-CM

## 2019-03-09 DIAGNOSIS — O365920 Maternal care for other known or suspected poor fetal growth, second trimester, not applicable or unspecified: Secondary | ICD-10-CM | POA: Diagnosis not present

## 2019-03-09 DIAGNOSIS — Z3A26 26 weeks gestation of pregnancy: Secondary | ICD-10-CM

## 2019-03-09 DIAGNOSIS — O99011 Anemia complicating pregnancy, first trimester: Secondary | ICD-10-CM | POA: Diagnosis not present

## 2019-03-09 DIAGNOSIS — B951 Streptococcus, group B, as the cause of diseases classified elsewhere: Secondary | ICD-10-CM

## 2019-03-09 DIAGNOSIS — O402XX0 Polyhydramnios, second trimester, not applicable or unspecified: Secondary | ICD-10-CM | POA: Diagnosis not present

## 2019-03-09 DIAGNOSIS — O2341 Unspecified infection of urinary tract in pregnancy, first trimester: Secondary | ICD-10-CM

## 2019-03-09 DIAGNOSIS — O365990 Maternal care for other known or suspected poor fetal growth, unspecified trimester, not applicable or unspecified: Secondary | ICD-10-CM

## 2019-03-09 DIAGNOSIS — Z362 Encounter for other antenatal screening follow-up: Secondary | ICD-10-CM

## 2019-03-20 DIAGNOSIS — I491 Atrial premature depolarization: Secondary | ICD-10-CM | POA: Diagnosis not present

## 2019-03-20 DIAGNOSIS — O365990 Maternal care for other known or suspected poor fetal growth, unspecified trimester, not applicable or unspecified: Secondary | ICD-10-CM | POA: Diagnosis not present

## 2019-03-22 DIAGNOSIS — O409XX0 Polyhydramnios, unspecified trimester, not applicable or unspecified: Secondary | ICD-10-CM | POA: Insufficient documentation

## 2019-03-22 DIAGNOSIS — O365990 Maternal care for other known or suspected poor fetal growth, unspecified trimester, not applicable or unspecified: Secondary | ICD-10-CM | POA: Insufficient documentation

## 2019-03-22 DIAGNOSIS — O368390 Maternal care for abnormalities of the fetal heart rate or rhythm, unspecified trimester, not applicable or unspecified: Secondary | ICD-10-CM | POA: Insufficient documentation

## 2019-03-22 NOTE — Progress Notes (Deleted)
Subjective:  Jordan [name] is a 28 y.o. G2P1001 at [redacted]w[redacted]d being seen today for ongoing prenatal care.  She is currently monitored for the following issues for this high-risk pregnancy and has Ophthalmoplegic migraine, not intractable; B12 deficiency anemia; Vitamin D deficiency; Supervision of other normal pregnancy, antepartum; Short interval between pregnancies affecting pregnancy in first trimester, antepartum; Anemia complicating pregnancy in first trimester; and GBS (group b Streptococcus) UTI complicating pregnancy, first trimester on their problem list.  Patient reports {sx:14538}.   .  .   . Denies leaking of fluid.   The following portions of the patient's history were reviewed and updated as appropriate: allergies, current medications, past family history, past medical history, past social history, past surgical history and problem list. Problem list updated.  Objective:  There were no vitals filed for this visit.  Fetal Status:           General:  Alert, oriented and cooperative. Patient is in no acute distress.  Skin: Skin is warm and dry. No rash noted.   Cardiovascular: Normal heart rate noted  Respiratory: Normal respiratory effort, no problems with respiration noted  Abdomen: Soft, gravid, appropriate for gestational age.       Pelvic:       {Blank single:19197::"Cervical exam performed","Cervical exam deferred"}        Extremities: Normal range of motion.     Mental Status: Normal mood and affect. Normal behavior. Normal judgment and thought content.   Urinalysis:      Assessment and Plan:  Pregnancy: G2P1001 at [redacted]w[redacted]d  1. Supervision of other normal pregnancy, antepartum *** - Tdap vaccine *** - HIV antibody (with reflex) - RPR - Glucose Tolerance, 2 Hours w/1 Hour - CBC  2. Anemia due to vitamin B12 deficiency, unspecified B12 deficiency type *** - pt reports ***  3. Anemia complicating pregnancy in first trimester *** -pt reports taking iron as prescribed,  hgb 8.2 at initial OB, will recheck today, pt denies symptoms  4. GBS (group b Streptococcus) UTI complicating pregnancy, first trimester *** - treated with Keflex 11/30/2018, TOC today - Urine Culture  5. Vitamin D deficiency *** - pt reports ***  6. Short interval between pregnancies affecting pregnancy in first trimester, antepartum *** - discussed contraception, pt elects ***  7. Poor fetal growth affecting management of mother in third trimester, single or unspecified fetus *** -EFW 7 --> 11% on [place] 03/09/2019, normal Dopplers -next growth [place] scheduled 03/29/2019, pt aware  8. Fetal arrhythmia affecting pregnancy, antepartum *** -?premature atrial contractions, counsled by MFM -fetal echo scheduled 03/20/2019, pt reports she attended appointment  9. Polyhydramnios in third trimester complication, single or unspecified fetus *** -mild polyhydramnios per MFM on [place] 03/09/2019  Preterm labor symptoms and general obstetric precautions including but not limited to vaginal bleeding, contractions, leaking of fluid and fetal movement were reviewed in detail with the patient. Please refer to After Visit Summary for other counseling recommendations.  No follow-ups on file.   Erian Rosengren, Gerrie Nordmann, NP

## 2019-03-23 ENCOUNTER — Encounter: Payer: 59 | Admitting: Women's Health

## 2019-03-23 ENCOUNTER — Telehealth (INDEPENDENT_AMBULATORY_CARE_PROVIDER_SITE_OTHER): Payer: 59 | Admitting: Women's Health

## 2019-03-23 VITALS — BP 96/68 | HR 88 | Wt 145.0 lb

## 2019-03-23 DIAGNOSIS — O368320 Maternal care for abnormalities of the fetal heart rate or rhythm, second trimester, not applicable or unspecified: Secondary | ICD-10-CM

## 2019-03-23 DIAGNOSIS — B951 Streptococcus, group B, as the cause of diseases classified elsewhere: Secondary | ICD-10-CM

## 2019-03-23 DIAGNOSIS — Z3A28 28 weeks gestation of pregnancy: Secondary | ICD-10-CM

## 2019-03-23 DIAGNOSIS — O99011 Anemia complicating pregnancy, first trimester: Secondary | ICD-10-CM

## 2019-03-23 DIAGNOSIS — O368390 Maternal care for abnormalities of the fetal heart rate or rhythm, unspecified trimester, not applicable or unspecified: Secondary | ICD-10-CM

## 2019-03-23 DIAGNOSIS — D519 Vitamin B12 deficiency anemia, unspecified: Secondary | ICD-10-CM

## 2019-03-23 DIAGNOSIS — O2341 Unspecified infection of urinary tract in pregnancy, first trimester: Secondary | ICD-10-CM

## 2019-03-23 DIAGNOSIS — O2342 Unspecified infection of urinary tract in pregnancy, second trimester: Secondary | ICD-10-CM

## 2019-03-23 DIAGNOSIS — O365930 Maternal care for other known or suspected poor fetal growth, third trimester, not applicable or unspecified: Secondary | ICD-10-CM

## 2019-03-23 DIAGNOSIS — O99012 Anemia complicating pregnancy, second trimester: Secondary | ICD-10-CM

## 2019-03-23 DIAGNOSIS — O403XX0 Polyhydramnios, third trimester, not applicable or unspecified: Secondary | ICD-10-CM

## 2019-03-23 DIAGNOSIS — Z3480 Encounter for supervision of other normal pregnancy, unspecified trimester: Secondary | ICD-10-CM

## 2019-03-23 NOTE — Progress Notes (Signed)
I connected with Jordan [name] on 03/23/19 at  8:15 AM EST by: phone and verified that I am speaking with the correct person using two identifiers.  Patient is located at home and provider is located at [facility].     The purpose of this virtual visit is to provide medical care while limiting exposure to the novel coronavirus. I discussed the limitations, risks, security and privacy concerns of performing an evaluation and management service by phone and the availability of in person appointments. I also discussed with the patient that there may be a patient responsible charge related to this service. By engaging in this virtual visit, you consent to the provision of healthcare.  Additionally, you authorize for your insurance to be billed for the services provided during this visit.  The patient expressed understanding and agreed to proceed.  The following staff members participated in the virtual visit:  Donia Ast, NP    PRENATAL VISIT NOTE  Subjective:  Jordan [name] is a 28 y.o. G2P1001 at [redacted]w[redacted]d  for phone visit for ongoing prenatal care.  She is currently monitored for the following issues for this high-risk pregnancy and has Ophthalmoplegic migraine, not intractable; B12 deficiency anemia; Vitamin D deficiency; Supervision of other normal pregnancy, antepartum; Short interval between pregnancies affecting pregnancy in first trimester, antepartum; Anemia complicating pregnancy in first trimester; GBS (group b Streptococcus) UTI complicating pregnancy, first trimester; IUGR (intrauterine growth restriction) affecting care of mother; Fetal arrhythmia affecting pregnancy, antepartum; and Polyhydramnios on their problem list.  Patient reports no complaints.  Contractions: Not present. Vag. Bleeding: None.  Movement: Present. Denies leaking of fluid.   The following portions of the patient's history were reviewed and updated as appropriate: allergies, current medications, past family  history, past medical history, past social history, past surgical history and problem list.   Objective:   Vitals:   03/23/19 0817  BP: 96/68  Pulse: 88  Weight: 145 lb (65.8 kg)   Self-Obtained  Fetal Status:     Movement: Present     Assessment and Plan:  Pregnancy: G2P1001 at [redacted]w[redacted]d  1. Supervision of other normal pregnancy, antepartum - pt was scheduled for in-person visit today, but her babysitter became ill with COVID symptoms and she was unable to find childcare last minute to come in. Pt unable to reschedule in-person visit for labs until January 2021. Pt advised to call office if she is able to come in for labs sooner. -pt concerned about weight gain, discussed appropriate weight gain in pregnancy -order for nutrition counseling, pt accepts if able to be done virtually -discussed COVID vaccine, pt works in healthcare, provided recommendations from [facility] -contraception information given  2. Poor fetal growth affecting management of mother in third trimester, single or unspecified fetus -7% -> 11% with normal Dopplers on 03/09/2019 -anticipatory guidance given on future ultrasounds -next [place] scheduled 03/29/2019  3. Fetal arrhythmia affecting pregnancy, antepartum -fetal echo done 03/20/2019, pt reports normal results but also aneurysmal PFO -repeat fetal fetal echo 04/19/2018  4. Polyhydramnios in third trimester complication, single or unspecified fetus -mild -noted on [place] 03/09/2019, next [place] 03/29/2019  6. GBS (group b Streptococcus) UTI complicating pregnancy, first trimester -needs TOC at next visit -treat in labor  7. Anemia affecting pregnancy -hgb 8.2 at NOB, pt prescribed iron, wil lrecheck with 28wk labs  Preterm labor symptoms and general obstetric precautions including but not limited to vaginal bleeding, contractions, leaking of fluid and fetal movement were reviewed in detail with the patient.  I discussed the assessment and treatment plan with the  patient. The patient was provided an opportunity to ask questions and all were answered. The patient agreed with the plan and demonstrated an understanding of the instructions. The patient was advised to call back or seek an in-person office evaluation/go to [facility] at [facility] for any urgent or concerning symptoms.  Return in about 2 weeks (around 04/06/2019) for in-person ROB, 28 week labs/TOC; nutrition consultation (virtual only).  Future Appointments  Date Time Provider [place]  03/29/2019  9:30 AM [facility] [place] 1 [facility]  03/29/2019  9:40 AM [facility] NURSE [facility]  04/13/2019  8:15 AM Julianne Handler, CNM [facility]  08/27/2019  8:30 AM Ward Givens, NP [facility] None    Time spent on virtual visit: 27 minutes  Clarisa Fling, NP

## 2019-03-23 NOTE — Patient Instructions (Addendum)
Third Trimester of Pregnancy The third trimester is from week 28 through week 40 (months 7 through 9). The third trimester is a time when the unborn baby (fetus) is growing rapidly. At the end of the ninth month, the fetus is about 20 inches in length and weighs 6-10 pounds. Body changes during your third trimester Your body will continue to go through many changes during pregnancy. The changes vary from woman to woman. During the third trimester:  Your weight will continue to increase. You can expect to gain 25-35 pounds (11-16 kg) by the end of the pregnancy.  You may begin to get stretch marks on your hips, abdomen, and breasts.  You may urinate more often because the fetus is moving lower into your pelvis and pressing on your bladder.  You may develop or continue to have heartburn. This is caused by increased hormones that slow down muscles in the digestive tract.  You may develop or continue to have constipation because increased hormones slow digestion and cause the muscles that push waste through your intestines to relax.  You may develop hemorrhoids. These are swollen veins (varicose veins) in the rectum that can itch or be painful.  You may develop swollen, bulging veins (varicose veins) in your legs.  You may have increased body aches in the pelvis, back, or thighs. This is due to weight gain and increased hormones that are relaxing your joints.  You may have changes in your hair. These can include thickening of your hair, rapid growth, and changes in texture. Some women also have hair loss during or after pregnancy, or hair that feels dry or thin. Your hair will most likely return to normal after your baby is born.  Your breasts will continue to grow and they will continue to become tender. A yellow fluid (colostrum) may leak from your breasts. This is the first milk you are producing for your baby.  Your belly button may stick out.  You may notice more swelling in your hands,  face, or ankles.  You may have increased tingling or numbness in your hands, arms, and legs. The skin on your belly may also feel numb.  You may feel short of breath because of your expanding uterus.  You may have more problems sleeping. This can be caused by the size of your belly, increased need to urinate, and an increase in your body's metabolism.  You may notice the fetus "dropping," or moving lower in your abdomen (lightening).  You may have increased vaginal discharge.  You may notice your joints feel loose and you may have pain around your pelvic bone. What to expect at prenatal visits You will have prenatal exams every 2 weeks until week 36. Then you will have weekly prenatal exams. During a routine prenatal visit:  You will be weighed to make sure you and the baby are growing normally.  Your blood pressure will be taken.  Your abdomen will be measured to track your baby's growth.  The fetal heartbeat will be listened to.  Any test results from the previous visit will be discussed.  You may have a cervical check near your due date to see if your cervix has softened or thinned (effaced).  You will be tested for Group B streptococcus. This happens between 35 and 37 weeks. Your health care provider may ask you:  What your birth plan is.  How you are feeling.  If you are feeling the baby move.  If you have had any abnormal   symptoms, such as leaking fluid, bleeding, severe headaches, or abdominal cramping.  If you are using any tobacco products, including cigarettes, chewing tobacco, and electronic cigarettes.  If you have any questions. Other tests or screenings that may be performed during your third trimester include:  Blood tests that check for low iron levels (anemia).  Fetal testing to check the health, activity level, and growth of the fetus. Testing is done if you have certain medical conditions or if there are problems during the pregnancy.  Nonstress test  (NST). This test checks the health of your baby to make sure there are no signs of problems, such as the baby not getting enough oxygen. During this test, a belt is placed around your belly. The baby is made to move, and its heart rate is monitored during movement. What is false labor? False labor is a condition in which you feel small, irregular tightenings of the muscles in the womb (contractions) that usually go away with rest, changing position, or drinking water. These are called Braxton Hicks contractions. Contractions may last for hours, days, or even weeks before true labor sets in. If contractions come at regular intervals, become more frequent, increase in intensity, or become painful, you should see your health care provider. What are the signs of labor?  Abdominal cramps.  Regular contractions that start at 10 minutes apart and become stronger and more frequent with time.  Contractions that start on the top of the uterus and spread down to the lower abdomen and back.  Increased pelvic pressure and dull back pain.  A watery or bloody mucus discharge that comes from the vagina.  Leaking of amniotic fluid. This is also known as your "water breaking." It could be a slow trickle or a gush. Let your health care provider know if it has a color or strange odor. If you have any of these signs, call your health care provider right away, even if it is before your due date. Follow these instructions at home: Medicines  Follow your health care provider's instructions regarding medicine use. Specific medicines may be either safe or unsafe to take during pregnancy.  Take a prenatal vitamin that contains at least 600 micrograms (mcg) of folic acid.  If you develop constipation, try taking a stool softener if your health care provider approves. Eating and drinking   Eat a balanced diet that includes fresh fruits and vegetables, whole grains, good sources of protein such as meat, eggs, or tofu,  and low-fat dairy. Your health care provider will help you determine the amount of weight gain that is right for you.  Avoid raw meat and uncooked cheese. These carry germs that can cause birth defects in the baby.  If you have low calcium intake from food, talk to your health care provider about whether you should take a daily calcium supplement.  Eat four or five small meals rather than three large meals a day.  Limit foods that are high in fat and processed sugars, such as fried and sweet foods.  To prevent constipation: ? Drink enough fluid to keep your urine clear or pale yellow. ? Eat foods that are high in fiber, such as fresh fruits and vegetables, whole grains, and beans. Activity  Exercise only as directed by your health care provider. Most women can continue their usual exercise routine during pregnancy. Try to exercise for 30 minutes at least 5 days a week. Stop exercising if you experience uterine contractions.  Avoid heavy lifting.  Do   not exercise in extreme heat or humidity, or at high altitudes.  Wear low-heel, comfortable shoes.  Practice good posture.  You may continue to have sex unless your health care provider tells you otherwise. Relieving pain and discomfort  Take frequent breaks and rest with your legs elevated if you have leg cramps or low back pain.  Take warm sitz baths to soothe any pain or discomfort caused by hemorrhoids. Use hemorrhoid cream if your health care provider approves.  Wear a good support bra to prevent discomfort from breast tenderness.  If you develop varicose veins: ? Wear support pantyhose or compression stockings as told by your healthcare provider. ? Elevate your feet for 15 minutes, 3-4 times a day. Prenatal care  Write down your questions. Take them to your prenatal visits.  Keep all your prenatal visits as told by your health care provider. This is important. Safety  Wear your seat belt at all times when driving.  Make  a list of emergency phone numbers, including numbers for family, friends, the hospital, and police and fire departments. General instructions  Avoid cat litter boxes and soil used by cats. These carry germs that can cause birth defects in the baby. If you have a cat, ask someone to clean the litter box for you.  Do not travel far distances unless it is absolutely necessary and only with the approval of your health care provider.  Do not use hot tubs, steam rooms, or saunas.  Do not drink alcohol.  Do not use any products that contain nicotine or tobacco, such as cigarettes and e-cigarettes. If you need help quitting, ask your health care provider.  Do not use any medicinal herbs or unprescribed drugs. These chemicals affect the formation and growth of the baby.  Do not douche or use tampons or scented sanitary pads.  Do not cross your legs for long periods of time.  To prepare for the arrival of your baby: ? Take prenatal classes to understand, practice, and ask questions about labor and delivery. ? Make a trial run to the hospital. ? Visit the hospital and tour the maternity area. ? Arrange for maternity or paternity leave through employers. ? Arrange for family and friends to take care of pets while you are in the hospital. ? Purchase a rear-facing car seat and make sure you know how to install it in your car. ? Pack your hospital bag. ? Prepare the baby's nursery. Make sure to remove all pillows and stuffed animals from the baby's crib to prevent suffocation.  Visit your dentist if you have not gone during your pregnancy. Use a soft toothbrush to brush your teeth and be gentle when you floss. Contact a health care provider if:  You are unsure if you are in labor or if your water has broken.  You become dizzy.  You have mild pelvic cramps, pelvic pressure, or nagging pain in your abdominal area.  You have lower back pain.  You have persistent nausea, vomiting, or  diarrhea.  You have an unusual or bad smelling vaginal discharge.  You have pain when you urinate. Get help right away if:  Your water breaks before 37 weeks.  You have regular contractions less than 5 minutes apart before 37 weeks.  You have a fever.  You are leaking fluid from your vagina.  You have spotting or bleeding from your vagina.  You have severe abdominal pain or cramping.  You have rapid weight loss or weight gain.  You have   shortness of breath with chest pain.  You notice sudden or extreme swelling of your face, hands, ankles, feet, or legs.  Your baby makes fewer than 10 movements in 2 hours.  You have severe headaches that do not go away when you take medicine.  You have vision changes. Summary  The third trimester is from week 28 through week 40, months 7 through 9. The third trimester is a time when the unborn baby (fetus) is growing rapidly.  During the third trimester, your discomfort may increase as you and your baby continue to gain weight. You may have abdominal, leg, and back pain, sleeping problems, and an increased need to urinate.  During the third trimester your breasts will keep growing and they will continue to become tender. A yellow fluid (colostrum) may leak from your breasts. This is the first milk you are producing for your baby.  False labor is a condition in which you feel small, irregular tightenings of the muscles in the womb (contractions) that eventually go away. These are called Braxton Hicks contractions. Contractions may last for hours, days, or even weeks before true labor sets in.  Signs of labor can include: abdominal cramps; regular contractions that start at 10 minutes apart and become stronger and more frequent with time; watery or bloody mucus discharge that comes from the vagina; increased pelvic pressure and dull back pain; and leaking of amniotic fluid. This information is not intended to replace advice given to you by your  health care provider. Make sure you discuss any questions you have with your health care provider. Document Released: 03/16/2001 Document Revised: 07/13/2018 Document Reviewed: 04/27/2016 Elsevier Patient Education  2020 [employer]. Preterm Labor and Birth Information  The normal length of a pregnancy is 39-41 weeks. Preterm labor is when labor starts before 37 completed weeks of pregnancy. What are the risk factors for preterm labor? Preterm labor is more likely to occur in women who:  Have certain infections during pregnancy such as a bladder infection, sexually transmitted infection, or infection inside the uterus (chorioamnionitis).  Have a shorter-than-normal cervix.  Have gone into preterm labor before.  Have had surgery on their cervix.  Are younger than age 13 or older than age 76.  Are African American.  Are pregnant with twins or multiple babies (multiple gestation).  Take street drugs or smoke while pregnant.  Do not gain enough weight while pregnant.  Became pregnant shortly after having been pregnant. What are the symptoms of preterm labor? Symptoms of preterm labor include:  Cramps similar to those that can happen during a menstrual period. The cramps may happen with diarrhea.  Pain in the abdomen or lower back.  Regular uterine contractions that may feel like tightening of the abdomen.  A feeling of increased pressure in the pelvis.  Increased watery or bloody mucus discharge from the vagina.  Water breaking (ruptured amniotic sac). Why is it important to recognize signs of preterm labor? It is important to recognize signs of preterm labor because babies who are born prematurely may not be fully developed. This can put them at an increased risk for:  Long-term (chronic) heart and lung problems.  Difficulty immediately after birth with regulating body systems, including blood sugar, body temperature, heart rate, and breathing rate.  Bleeding in the  brain.  Cerebral palsy.  Learning difficulties.  Death. These risks are highest for babies who are born before 34 weeks of pregnancy. How is preterm labor treated? Treatment depends on the length of  your pregnancy, your condition, and the health of your baby. It may involve:  Having a stitch (suture) placed in your cervix to prevent your cervix from opening too early (cerclage).  Taking or being given medicines, such as: ? Hormone medicines. These may be given early in pregnancy to help support the pregnancy. ? Medicine to stop contractions. ? Medicines to help mature the baby's lungs. These may be prescribed if the risk of delivery is high. ? Medicines to prevent your baby from developing cerebral palsy. If the labor happens before 34 weeks of pregnancy, you may need to stay in the hospital. What should I do if I think I am in preterm labor? If you think that you are going into preterm labor, call your health care provider right away. How can I prevent preterm labor in future pregnancies? To increase your chance of having a full-term pregnancy:  Do not use any tobacco products, such as cigarettes, chewing tobacco, and e-cigarettes. If you need help quitting, ask your health care provider.  Do not use street drugs or medicines that have not been prescribed to you during your pregnancy.  Talk with your health care provider before taking any herbal supplements, even if you have been taking them regularly.  Make sure you gain a healthy amount of weight during your pregnancy.  Watch for infection. If you think that you might have an infection, get it checked right away.  Make sure to tell your health care provider if you have gone into preterm labor before. This information is not intended to replace advice given to you by your health care provider. Make sure you discuss any questions you have with your health care provider. Document Released: 06/12/2003 Document Revised: 07/14/2018  Document Reviewed: 08/13/2015 Elsevier Patient Education  2020 [employer]  Healthy Edison InternationalWeight Gain During Pregnancy, Adult A certain amount of weight gain during pregnancy is normal and healthy. How much weight you should gain depends on your overall health and a measurement called BMI (body mass index). BMI is an estimate of your body fat based on your height and weight. You can use an Software engineeronline calculator to figure out your BMI, or you can ask your health care provider to calculate it for you at your next visit. Your recommended pregnancy weight gain is based on your pre-pregnancy BMI. General guidelines for a healthy total weight gain during pregnancy are listed below. If your BMI at or before the start of your pregnancy is:  Less than 18.5 (underweight), you should gain 28-40 lb (13-18 kg).  18.5-24.9 (normal weight), you should gain 25-35 lb (11-16 kg).  25-29.9 (overweight), you should gain 15-25 lb (7-11 kg).  30 or higher (obese), you should gain 11-20 lb (5-9 kg). These ranges vary depending on your individual health. If you are carrying more than one baby (multiples), it may be safe to gain more weight than these recommendations. If you gain less weight than recommended, that may be safe as long as your baby is growing and developing normally. How can unhealthy weight gain affect me and my baby? Gaining too much weight during pregnancy can lead to pregnancy complications, such as:  A temporary form of diabetes that develops during pregnancy (gestational diabetes).  High blood pressure during pregnancy and protein in your urine (preeclampsia).  High blood pressure during pregnancy without protein in your urine (gestational hypertension).  Your baby having a high weight at birth, which may: ? Raise your risk of having a more difficult delivery or  a surgical delivery (cesarean delivery, or C-section). ? Raise your child's risk of developing obesity during childhood. Not gaining enough  weight can be life-threatening for your baby, and it may raise your baby's chances of:  Being born early (preterm).  Growing more slowly than normal during pregnancy (growth restriction).  Having a low weight at birth. What actions can I take to gain a healthy amount of weight during pregnancy? General instructions  Keep track of your weight gain during pregnancy.  Take over-the-counter and prescription medicines only as told by your health care provider. Take all prenatal supplements as directed.  Keep all health care visits during pregnancy (prenatal visits). These visits are a good time to discuss your weight gain. Your health care provider will weigh you at each visit to make sure you are gaining a healthy amount of weight. Nutrition   Eat a balanced, nutrient-rich diet. Eat plenty of: ? Fruits and vegetables, such as berries and broccoli. ? Whole grains, such as millet, barley, whole-wheat breads and cereals, and oatmeal. ? Low-fat dairy products or non-dairy products such as almond milk or rice milk. ? Protein foods, such as lean meat, chicken, eggs, and legumes (such as peas, beans, soybeans, and lentils).  Avoid foods that are fried or have a lot of fat, salt (sodium), or sugar.  Drink enough fluid to keep your urine pale yellow.  Choose healthy snack and drink options when you are at work or on the go: ? Drink water. Avoid soda, sports drinks, and juices that have added sugar. ? Avoid drinks with caffeine, such as coffee and energy drinks. ? Eat snacks that are high in protein, such as nuts, protein bars, and low-fat yogurt. ? Carry convenient snacks in your purse that do not need refrigeration, such as a pack of trail mix, an apple, or a granola bar.  If you need help improving your diet, work with a health care provider or a diet and nutrition specialist (dietitian). Activity   Exercise regularly, as told by your health care provider. ? If you were active before  becoming pregnant, you may be able to continue your regular fitness activities. ? If you were not active before pregnancy, you may gradually build up to exercising for 30 or more minutes on most days of the week. This may include walking, swimming, or yoga.  Ask your health care provider what activities are safe for you. Talk with your health care provider about whether you may need to be excused from certain school or work activities. Where to find more information Learn more about managing your weight gain during pregnancy from:  American Pregnancy Association: www.americanpregnancy.org  [place] Department of Agriculture pregnancy weight gain calculator: https://ball-collins.biz/ Summary  Too much weight gain during pregnancy can lead to complications for you and your baby.  Find out your pre-pregnancy BMI to determine how much weight gain is healthy for you.  Eat nutritious foods and stay active.  Keep all of your prenatal visits as told by your health care provider. This information is not intended to replace advice given to you by your health care provider. Make sure you discuss any questions you have with your health care provider. Document Released: 12/10/2016 Document Revised: 12/10/2016 Document Reviewed: 12/10/2016 Elsevier Patient Education  2020 [employer].  Third Trimester of Pregnancy The third trimester is from week 28 through week 40 (months 7 through 9). The third trimester is a time when the unborn baby (fetus) is growing rapidly. At the end of  the ninth month, the fetus is about 20 inches in length and weighs 6-10 pounds. Body changes during your third trimester Your body will continue to go through many changes during pregnancy. The changes vary from woman to woman. During the third trimester:  Your weight will continue to increase. You can expect to gain 25-35 pounds (11-16 kg) by the end of the pregnancy.  You may begin to get stretch marks on your hips, abdomen, and  breasts.  You may urinate more often because the fetus is moving lower into your pelvis and pressing on your bladder.  You may develop or continue to have heartburn. This is caused by increased hormones that slow down muscles in the digestive tract.  You may develop or continue to have constipation because increased hormones slow digestion and cause the muscles that push waste through your intestines to relax.  You may develop hemorrhoids. These are swollen veins (varicose veins) in the rectum that can itch or be painful.  You may develop swollen, bulging veins (varicose veins) in your legs.  You may have increased body aches in the pelvis, back, or thighs. This is due to weight gain and increased hormones that are relaxing your joints.  You may have changes in your hair. These can include thickening of your hair, rapid growth, and changes in texture. Some women also have hair loss during or after pregnancy, or hair that feels dry or thin. Your hair will most likely return to normal after your baby is born.  Your breasts will continue to grow and they will continue to become tender. A yellow fluid (colostrum) may leak from your breasts. This is the first milk you are producing for your baby.  Your belly button may stick out.  You may notice more swelling in your hands, face, or ankles.  You may have increased tingling or numbness in your hands, arms, and legs. The skin on your belly may also feel numb.  You may feel short of breath because of your expanding uterus.  You may have more problems sleeping. This can be caused by the size of your belly, increased need to urinate, and an increase in your body's metabolism.  You may notice the fetus "dropping," or moving lower in your abdomen (lightening).  You may have increased vaginal discharge.  You may notice your joints feel loose and you may have pain around your pelvic bone. What to expect at prenatal visits You will have prenatal  exams every 2 weeks until week 36. Then you will have weekly prenatal exams. During a routine prenatal visit:  You will be weighed to make sure you and the baby are growing normally.  Your blood pressure will be taken.  Your abdomen will be measured to track your baby's growth.  The fetal heartbeat will be listened to.  Any test results from the previous visit will be discussed.  You may have a cervical check near your due date to see if your cervix has softened or thinned (effaced).  You will be tested for Group B streptococcus. This happens between 35 and 37 weeks. Your health care provider may ask you:  What your birth plan is.  How you are feeling.  If you are feeling the baby move.  If you have had any abnormal symptoms, such as leaking fluid, bleeding, severe headaches, or abdominal cramping.  If you are using any tobacco products, including cigarettes, chewing tobacco, and electronic cigarettes.  If you have any questions. Other tests or  screenings that may be performed during your third trimester include:  Blood tests that check for low iron levels (anemia).  Fetal testing to check the health, activity level, and growth of the fetus. Testing is done if you have certain medical conditions or if there are problems during the pregnancy.  Nonstress test (NST). This test checks the health of your baby to make sure there are no signs of problems, such as the baby not getting enough oxygen. During this test, a belt is placed around your belly. The baby is made to move, and its heart rate is monitored during movement. What is false labor? False labor is a condition in which you feel small, irregular tightenings of the muscles in the womb (contractions) that usually go away with rest, changing position, or drinking water. These are called Braxton Hicks contractions. Contractions may last for hours, days, or even weeks before true labor sets in. If contractions come at regular  intervals, become more frequent, increase in intensity, or become painful, you should see your health care provider. What are the signs of labor?  Abdominal cramps.  Regular contractions that start at 10 minutes apart and become stronger and more frequent with time.  Contractions that start on the top of the uterus and spread down to the lower abdomen and back.  Increased pelvic pressure and dull back pain.  A watery or bloody mucus discharge that comes from the vagina.  Leaking of amniotic fluid. This is also known as your "water breaking." It could be a slow trickle or a gush. Let your health care provider know if it has a color or strange odor. If you have any of these signs, call your health care provider right away, even if it is before your due date. Follow these instructions at home: Medicines  Follow your health care provider's instructions regarding medicine use. Specific medicines may be either safe or unsafe to take during pregnancy.  Take a prenatal vitamin that contains at least 600 micrograms (mcg) of folic acid.  If you develop constipation, try taking a stool softener if your health care provider approves. Eating and drinking   Eat a balanced diet that includes fresh fruits and vegetables, whole grains, good sources of protein such as meat, eggs, or tofu, and low-fat dairy. Your health care provider will help you determine the amount of weight gain that is right for you.  Avoid raw meat and uncooked cheese. These carry germs that can cause birth defects in the baby.  If you have low calcium intake from food, talk to your health care provider about whether you should take a daily calcium supplement.  Eat four or five small meals rather than three large meals a day.  Limit foods that are high in fat and processed sugars, such as fried and sweet foods.  To prevent constipation: ? Drink enough fluid to keep your urine clear or pale yellow. ? Eat foods that are high  in fiber, such as fresh fruits and vegetables, whole grains, and beans. Activity  Exercise only as directed by your health care provider. Most women can continue their usual exercise routine during pregnancy. Try to exercise for 30 minutes at least 5 days a week. Stop exercising if you experience uterine contractions.  Avoid heavy lifting.  Do not exercise in extreme heat or humidity, or at high altitudes.  Wear low-heel, comfortable shoes.  Practice good posture.  You may continue to have sex unless your health care provider tells you otherwise.  Relieving pain and discomfort  Take frequent breaks and rest with your legs elevated if you have leg cramps or low back pain.  Take warm sitz baths to soothe any pain or discomfort caused by hemorrhoids. Use hemorrhoid cream if your health care provider approves.  Wear a good support bra to prevent discomfort from breast tenderness.  If you develop varicose veins: ? Wear support pantyhose or compression stockings as told by your healthcare provider. ? Elevate your feet for 15 minutes, 3-4 times a day. Prenatal care  Write down your questions. Take them to your prenatal visits.  Keep all your prenatal visits as told by your health care provider. This is important. Safety  Wear your seat belt at all times when driving.  Make a list of emergency phone numbers, including numbers for family, friends, the hospital, and police and fire departments. General instructions  Avoid cat litter boxes and soil used by cats. These carry germs that can cause birth defects in the baby. If you have a cat, ask someone to clean the litter box for you.  Do not travel far distances unless it is absolutely necessary and only with the approval of your health care provider.  Do not use hot tubs, steam rooms, or saunas.  Do not drink alcohol.  Do not use any products that contain nicotine or tobacco, such as cigarettes and e-cigarettes. If you need help  quitting, ask your health care provider.  Do not use any medicinal herbs or unprescribed drugs. These chemicals affect the formation and growth of the baby.  Do not douche or use tampons or scented sanitary pads.  Do not cross your legs for long periods of time.  To prepare for the arrival of your baby: ? Take prenatal classes to understand, practice, and ask questions about labor and delivery. ? Make a trial run to the hospital. ? Visit the hospital and tour the maternity area. ? Arrange for maternity or paternity leave through employers. ? Arrange for family and friends to take care of pets while you are in the hospital. ? Purchase a rear-facing car seat and make sure you know how to install it in your car. ? Pack your hospital bag. ? Prepare the baby's nursery. Make sure to remove all pillows and stuffed animals from the baby's crib to prevent suffocation.  Visit your dentist if you have not gone during your pregnancy. Use a soft toothbrush to brush your teeth and be gentle when you floss. Contact a health care provider if:  You are unsure if you are in labor or if your water has broken.  You become dizzy.  You have mild pelvic cramps, pelvic pressure, or nagging pain in your abdominal area.  You have lower back pain.  You have persistent nausea, vomiting, or diarrhea.  You have an unusual or bad smelling vaginal discharge.  You have pain when you urinate. Get help right away if:  Your water breaks before 37 weeks.  You have regular contractions less than 5 minutes apart before 37 weeks.  You have a fever.  You are leaking fluid from your vagina.  You have spotting or bleeding from your vagina.  You have severe abdominal pain or cramping.  You have rapid weight loss or weight gain.  You have shortness of breath with chest pain.  You notice sudden or extreme swelling of your face, hands, ankles, feet, or legs.  Your baby makes fewer than 10 movements in 2  hours.  You have  severe headaches that do not go away when you take medicine.  You have vision changes. Summary  The third trimester is from week 28 through week 40, months 7 through 9. The third trimester is a time when the unborn baby (fetus) is growing rapidly.  During the third trimester, your discomfort may increase as you and your baby continue to gain weight. You may have abdominal, leg, and back pain, sleeping problems, and an increased need to urinate.  During the third trimester your breasts will keep growing and they will continue to become tender. A yellow fluid (colostrum) may leak from your breasts. This is the first milk you are producing for your baby.  False labor is a condition in which you feel small, irregular tightenings of the muscles in the womb (contractions) that eventually go away. These are called Braxton Hicks contractions. Contractions may last for hours, days, or even weeks before true labor sets in.  Signs of labor can include: abdominal cramps; regular contractions that start at 10 minutes apart and become stronger and more frequent with time; watery or bloody mucus discharge that comes from the vagina; increased pelvic pressure and dull back pain; and leaking of amniotic fluid. This information is not intended to replace advice given to you by your health care provider. Make sure you discuss any questions you have with your health care provider. Document Released: 03/16/2001 Document Revised: 07/13/2018 Document Reviewed: 04/27/2016 Elsevier Patient Education  2020 [employer]  Glucose Tolerance Test During Pregnancy Why am I having this test? The glucose tolerance test (GTT) is done to check how your body processes sugar (glucose). This is one of several tests used to diagnose diabetes that develops during pregnancy (gestational diabetes mellitus). Gestational diabetes is a temporary form of diabetes that some women develop during pregnancy. It usually  occurs during the second trimester of pregnancy and goes away after delivery. Testing (screening) for gestational diabetes usually occurs between 24 and 28 weeks of pregnancy. You may have the GTT test after having a 1-hour glucose screening test if the results from that test indicate that you may have gestational diabetes. You may also have this test if:  You have a history of gestational diabetes.  You have a history of giving birth to very large babies or have experienced repeated fetal loss (stillbirth).  You have signs and symptoms of diabetes, such as: ? Changes in your vision. ? Tingling or numbness in your hands or feet. ? Changes in hunger, thirst, and urination that are not otherwise explained by your pregnancy. What is being tested? This test measures the amount of glucose in your blood at different times during a period of 3 hours. This indicates how well your body is able to process glucose. What kind of sample is taken?  Blood samples are required for this test. They are usually collected by inserting a needle into a blood vessel. How do I prepare for this test?  For 3 days before your test, eat normally. Have plenty of carbohydrate-rich foods.  Follow instructions from your health care provider about: ? Eating or drinking restrictions on the day of the test. You may be asked to not eat or drink anything other than water (fast) starting 8-10 hours before the test. ? Changing or stopping your regular medicines. Some medicines may interfere with this test. Tell a health care provider about:  All medicines you are taking, including vitamins, herbs, eye drops, creams, and over-the-counter medicines.  Any blood disorders you have.  Any surgeries you have had.  Any medical conditions you have. What happens during the test? First, your blood glucose will be measured. This is referred to as your fasting blood glucose, since you fasted before the test. Then, you will drink a  glucose solution that contains a certain amount of glucose. Your blood glucose will be measured again 1, 2, and 3 hours after drinking the solution. This test takes about 3 hours to complete. You will need to stay at the testing location during this time. During the testing period:  Do not eat or drink anything other than the glucose solution.  Do not exercise.  Do not use any products that contain nicotine or tobacco, such as cigarettes and e-cigarettes. If you need help stopping, ask your health care provider. The testing procedure may vary among health care providers and hospitals. How are the results reported? Your results will be reported as milligrams of glucose per deciliter of blood (mg/dL) or millimoles per liter (mmol/L). Your health care provider will compare your results to normal ranges that were established after testing a large group of people (reference ranges). Reference ranges may vary among labs and hospitals. For this test, common reference ranges are:  Fasting: less than 95-105 mg/dL (1.6-1.0 mmol/L).  1 hour after drinking glucose: less than 180-190 mg/dL (96.0-45.4 mmol/L).  2 hours after drinking glucose: less than 155-165 mg/dL (0.9-8.1 mmol/L).  3 hours after drinking glucose: 140-145 mg/dL (1.9-1.4 mmol/L). What do the results mean? Results within reference ranges are considered normal, meaning that your glucose levels are well-controlled. If two or more of your blood glucose levels are high, you may be diagnosed with gestational diabetes. If only one level is high, your health care provider may suggest repeat testing or other tests to confirm a diagnosis. Talk with your health care provider about what your results mean. Questions to ask your health care provider Ask your health care provider, or the department that is doing the test:  When will my results be ready?  How will I get my results?  What are my treatment options?  What other tests do I  need?  What are my next steps? Summary  The glucose tolerance test (GTT) is one of several tests used to diagnose diabetes that develops during pregnancy (gestational diabetes mellitus). Gestational diabetes is a temporary form of diabetes that some women develop during pregnancy.  You may have the GTT test after having a 1-hour glucose screening test if the results from that test indicate that you may have gestational diabetes. You may also have this test if you have any symptoms or risk factors for gestational diabetes.  Talk with your health care provider about what your results mean. This information is not intended to replace advice given to you by your health care provider. Make sure you discuss any questions you have with your health care provider. Document Released: 09/21/2011 Document Revised: 07/13/2018 Document Reviewed: 11/01/2016 Elsevier Patient Education  2020 [employer].  The Maternity Assessment Unit (MAU) is located at the [facility] at [facility]. The address is: [address], [place], [place], [place] [address]. Please see map below for additional directions.    The Maternity Assessment Unit is designed to help you during your pregnancy, and for up to 6 weeks after delivery, with any pregnancy- or postpartum-related emergencies, if you think you are in labor, or if your water has broken. For example, if you experience nausea and vomiting, vaginal bleeding,  severe abdominal or pelvic pain, elevated blood pressure or other problems related to your pregnancy or postpartum time, please come to the Maternity Assessment Unit for assistance.   Places to have your son circumcised:                                                                      [facility]     [phone] while you are in hospital         [facility]              [phone]   $269 by 4 wks                      [facility]                     [phone]   $269 by 7  days [facility]                    [phone]   $269 by 4 wks [facility]             [phone]   $225 by 2 wks    These prices sometimes change but are roughly what you can expect to pay. Please call and confirm pricing.   Circumcision is considered an elective/non-medically necessary procedure. There are many reasons parents decide to have their sons circumsized. During the first year of life circumcised males have a reduced risk of urinary tract infections but after this year the rates between circumcised males and uncircumcised males are the same.  It is safe to have your son circumcised outside of the hospital and the places above perform them regularly.   Deciding about Circumcision in Baby Boys  (Up-to-date The Basics)  What is circumcision?   Circumcision is a surgery that removes the skin that covers the tip of the penis, called the "foreskin" Circumcision is usually done when a boy is between 36 and 68 days old. In the [place], circumcision is common. In some other countries, fewer boys are circumcised. Circumcision is a common tradition in some religions.  Should I have my baby boy circumcised?   There is no easy answer. Circumcision has some benefits. But it also has risks. After talking with your doctor, you will have to decide for yourself what is right for your family.  What are the benefits of circumcision?   Circumcised boys seem to have slightly lower rates of: ?Urinary tract infections ?Swelling of the opening at the tip of the penis Circumcised men seem to have slightly lower rates of: ?Urinary tract infections ?Swelling of the opening at the tip of the penis ?Penis cancer ?HIV and other infections that you catch during sex ?Cervical cancer in the women they have sex with Even so, in the [place], the risks of these problems are small - even in boys and men who have not been circumcised. Plus, boys and men who are not circumcised can reduce these extra risks  by: ?Cleaning their penis well ?Using condoms during sex  What are the risks of circumcision?  Risks include: ?Bleeding or infection from the surgery ?Damage to or amputation of the penis ?A chance that the  doctor will cut off too much or not enough of the foreskin ?A chance that sex won't feel as good later in life Only about 1 out of every 200 circumcisions leads to problems. There is also a chance that your health insurance won't pay for circumcision.  How is circumcision done in baby boys?  First, the baby gets medicine for pain relief. This might be a cream on the skin or a shot into the base of the penis. Next, the doctor cleans the baby's penis well. Then he or she uses special tools to cut off the foreskin. Finally, the doctor wraps a bandage (called gauze) around the baby's penis. If you have your baby circumcised, his doctor or nurse will give you instructions on how to care for him after the surgery. It is important that you follow those instructions carefully.   Deciding about Circumcision in Baby Boys  (The Basics)  What is circumcision?  Circumcision is a surgery that removes the skin that covers the tip of the penis, called the "foreskin" Circumcision is usually done when a boy is between 63 and 33 days old. In the [place], circumcision is common. In some other countries, fewer boys are circumcised. Circumcision is a common tradition in some religions.  Should I have my baby boy circumcised?  There is no easy answer. Circumcision has some benefits. But it also has risks. After talking with your doctor, you will have to decide for yourself what is right for your family.  What are the benefits of circumcision?  Circumcised boys seem to have slightly lower rates of: ?Urinary tract infections ?Swelling of the opening at the tip of the penis Circumcised men seem to have slightly lower rates of: ?Urinary tract infections ?Swelling of the opening at the tip of the  penis ?Penis cancer ?HIV and other infections that you catch during sex ?Cervical cancer in the women they have sex with Even so, in the [place], the risks of these problems are small - even in boys and men who have not been circumcised. Plus, boys and men who are not circumcised can reduce these extra risks by: ?Cleaning their penis well ?Using condoms during sex  What are the risks of circumcision?  Risks include: ?Bleeding or infection from the surgery ?Damage to or amputation of the penis ?A chance that the doctor will cut off too much or not enough of the foreskin ?A chance that sex won't feel as good later in life Only about 1 out of every 200 circumcisions leads to problems. There is also a chance that your health insurance won't pay for circumcision.  How is circumcision done in baby boys?  First, the baby gets medicine for pain relief. This might be a cream on the skin or a shot into the base of the penis. Next, the doctor cleans the baby's penis well. Then he or she uses special tools to cut off the foreskin. Finally, the doctor wraps a bandage (called gauze) around the baby's penis. If you have your baby circumcised, his doctor or nurse will give you instructions on how to care for him after the surgery. It is important that you follow those instructions carefully.   Contraception Choices Contraception, also called birth control, refers to methods or devices that prevent pregnancy. Hormonal methods Contraceptive implant  A contraceptive implant is a thin, plastic tube that contains a hormone. It is inserted into the upper part of the arm. It can remain in place for up to  3 years. Progestin-only injections Progestin-only injections are injections of progestin, a synthetic form of the hormone progesterone. They are given every 3 months by a health care provider. Birth control pills  Birth control pills are pills that contain hormones that prevent pregnancy. They must  be taken once a day, preferably at the same time each day. Birth control patch  The birth control patch contains hormones that prevent pregnancy. It is placed on the skin and must be changed once a week for three weeks and removed on the fourth week. A prescription is needed to use this method of contraception. Vaginal ring  A vaginal ring contains hormones that prevent pregnancy. It is placed in the vagina for three weeks and removed on the fourth week. After that, the process is repeated with a new ring. A prescription is needed to use this method of contraception. Emergency contraceptive Emergency contraceptives prevent pregnancy after unprotected sex. They come in pill form and can be taken up to 5 days after sex. They work best the sooner they are taken after having sex. Most emergency contraceptives are available without a prescription. This method should not be used as your only form of birth control. Barrier methods Female condom  A female condom is a thin sheath that is worn over the penis during sex. Condoms keep sperm from going inside a woman's body. They can be used with a spermicide to increase their effectiveness. They should be disposed after a single use. Female condom  A female condom is a soft, loose-fitting sheath that is put into the vagina before sex. The condom keeps sperm from going inside a woman's body. They should be disposed after a single use. Diaphragm  A diaphragm is a soft, dome-shaped barrier. It is inserted into the vagina before sex, along with a spermicide. The diaphragm blocks sperm from entering the uterus, and the spermicide kills sperm. A diaphragm should be left in the vagina for 6-8 hours after sex and removed within 24 hours. A diaphragm is prescribed and fitted by a health care provider. A diaphragm should be replaced every 1-2 years, after giving birth, after gaining more than 15 lb (6.8 kg), and after pelvic surgery. Cervical cap  A cervical cap is a  round, soft latex or plastic cup that fits over the cervix. It is inserted into the vagina before sex, along with spermicide. It blocks sperm from entering the uterus. The cap should be left in place for 6-8 hours after sex and removed within 48 hours. A cervical cap must be prescribed and fitted by a health care provider. It should be replaced every 2 years. Sponge  A sponge is a soft, circular piece of polyurethane foam with spermicide on it. The sponge helps block sperm from entering the uterus, and the spermicide kills sperm. To use it, you make it wet and then insert it into the vagina. It should be inserted before sex, left in for at least 6 hours after sex, and removed and thrown away within 30 hours. Spermicides Spermicides are chemicals that kill or block sperm from entering the cervix and uterus. They can come as a cream, jelly, suppository, foam, or tablet. A spermicide should be inserted into the vagina with an applicator at least 10-15 minutes before sex to allow time for it to work. The process must be repeated every time you have sex. Spermicides do not require a prescription. Intrauterine contraception Intrauterine device (IUD) An IUD is a T-shaped device that is put in a  woman's uterus. There are two types:  Hormone IUD.This type contains progestin, a synthetic form of the hormone progesterone. This type can stay in place for 3-5 years.  Copper IUD.This type is wrapped in copper wire. It can stay in place for 10 years.  Permanent methods of contraception Female tubal ligation In this method, a woman's fallopian tubes are sealed, tied, or blocked during surgery to prevent eggs from traveling to the uterus. Hysteroscopic sterilization In this method, a small, flexible insert is placed into each fallopian tube. The inserts cause scar tissue to form in the fallopian tubes and block them, so sperm cannot reach an egg. The procedure takes about 3 months to be effective. Another form of  birth control must be used during those 3 months. Female sterilization This is a procedure to tie off the tubes that carry sperm (vasectomy). After the procedure, the man can still ejaculate fluid (semen). Natural planning methods Natural family planning In this method, a couple does not have sex on days when the woman could become pregnant. Calendar method This means keeping track of the length of each menstrual cycle, identifying the days when pregnancy can happen, and not having sex on those days. Ovulation method In this method, a couple avoids sex during ovulation. Symptothermal method This method involves not having sex during ovulation. The woman typically checks for ovulation by watching changes in her temperature and in the consistency of cervical mucus. Post-ovulation method In this method, a couple waits to have sex until after ovulation. Summary  Contraception, also called birth control, means methods or devices that prevent pregnancy.  Hormonal methods of contraception include implants, injections, pills, patches, vaginal rings, and emergency contraceptives.  Barrier methods of contraception can include female condoms, female condoms, diaphragms, cervical caps, sponges, and spermicides.  There are two types of IUDs (intrauterine devices). An IUD can be put in a woman's uterus to prevent pregnancy for 3-5 years.  Permanent sterilization can be done through a procedure for males, females, or both.  Natural family planning methods involve not having sex on days when the woman could become pregnant. This information is not intended to replace advice given to you by your health care provider. Make sure you discuss any questions you have with your health care provider. Document Released: 03/22/2005 Document Revised: 03/24/2017 Document Reviewed: 04/24/2016 Elsevier Patient Education  2020 [employer].

## 2019-03-29 ENCOUNTER — Ambulatory Visit (HOSPITAL_COMMUNITY)
Admission: RE | Admit: 2019-03-29 | Discharge: 2019-03-29 | Disposition: A | Discharge status: Home / Self Care | Payer: 59 | Source: Ambulatory Visit | Attending: Obstetrics and Gynecology | Admitting: Obstetrics and Gynecology

## 2019-03-29 ENCOUNTER — Other Ambulatory Visit (HOSPITAL_COMMUNITY): Payer: Self-pay | Admitting: *Deleted

## 2019-03-29 ENCOUNTER — Ambulatory Visit (HOSPITAL_COMMUNITY): Payer: 59 | Admitting: *Deleted

## 2019-03-29 ENCOUNTER — Encounter (HOSPITAL_COMMUNITY): Payer: Self-pay | Admitting: *Deleted

## 2019-03-29 ENCOUNTER — Other Ambulatory Visit: Payer: Self-pay

## 2019-03-29 DIAGNOSIS — O99011 Anemia complicating pregnancy, first trimester: Secondary | ICD-10-CM

## 2019-03-29 DIAGNOSIS — Z362 Encounter for other antenatal screening follow-up: Secondary | ICD-10-CM | POA: Diagnosis not present

## 2019-03-29 DIAGNOSIS — O368330 Maternal care for abnormalities of the fetal heart rate or rhythm, third trimester, not applicable or unspecified: Secondary | ICD-10-CM

## 2019-03-29 DIAGNOSIS — O409XX0 Polyhydramnios, unspecified trimester, not applicable or unspecified: Secondary | ICD-10-CM

## 2019-03-29 DIAGNOSIS — O368390 Maternal care for abnormalities of the fetal heart rate or rhythm, unspecified trimester, not applicable or unspecified: Secondary | ICD-10-CM

## 2019-03-29 DIAGNOSIS — O365930 Maternal care for other known or suspected poor fetal growth, third trimester, not applicable or unspecified: Secondary | ICD-10-CM | POA: Diagnosis not present

## 2019-03-29 DIAGNOSIS — Z3480 Encounter for supervision of other normal pregnancy, unspecified trimester: Secondary | ICD-10-CM | POA: Diagnosis not present

## 2019-03-29 DIAGNOSIS — O2341 Unspecified infection of urinary tract in pregnancy, first trimester: Secondary | ICD-10-CM | POA: Insufficient documentation

## 2019-03-29 DIAGNOSIS — B951 Streptococcus, group B, as the cause of diseases classified elsewhere: Secondary | ICD-10-CM

## 2019-03-29 DIAGNOSIS — Z3A29 29 weeks gestation of pregnancy: Secondary | ICD-10-CM | POA: Diagnosis not present

## 2019-04-06 NOTE — L&D Delivery Note (Signed)
OB/[facility]N [facility] Delivery Note  Jordan [name] is a 29 y.o. [id number] s/p NSVD at [redacted]w[redacted]d. She was admitted for SROM.   ROM: 9h 54m with clear fluid GBS Status: Positive   Maximum Maternal Temperature: 98.2 F    Labor Progress: . Patient arrived at 3 cm dilation and progressed spontaneously to fully dilated.   Delivery Date/Time: 06/11/2019 at 0509 Delivery: Called to room and patient was complete and pushing. Head delivered in OA position. No nuchal cord present. Shoulder and body delivered in usual fashion. Infant with spontaneous cry, placed on mother's abdomen, dried and stimulated. Cord clamped x 2 after 1-minute delay, and cut by FOB. Cord blood drawn. Placenta delivered spontaneously with gentle cord traction. Fundus firm with massage and Pitocin. Labia, perineum, vagina, and cervix inspected with no lacerations.   Placenta: 3v intact to L&D Complications: none Lacerations: none EBL: 600 cc Analgesia: epidural   Infant: APGAR (1 MIN):  7 APGAR (5 MINS):  9  Weight: 3385 grams  Zack Seal, MD/[facility] OB/GYN Fellow, Faculty Practice

## 2019-04-12 ENCOUNTER — Other Ambulatory Visit: Payer: 59

## 2019-04-13 ENCOUNTER — Encounter: Payer: Self-pay | Admitting: Certified Nurse Midwife

## 2019-04-13 ENCOUNTER — Other Ambulatory Visit: Payer: Self-pay

## 2019-04-13 ENCOUNTER — Ambulatory Visit (INDEPENDENT_AMBULATORY_CARE_PROVIDER_SITE_OTHER): Payer: No Typology Code available for payment source | Admitting: Certified Nurse Midwife

## 2019-04-13 VITALS — BP 110/70 | HR 93 | Temp 98.0°F | Wt 151.0 lb

## 2019-04-13 DIAGNOSIS — O403XX0 Polyhydramnios, third trimester, not applicable or unspecified: Secondary | ICD-10-CM

## 2019-04-13 DIAGNOSIS — R109 Unspecified abdominal pain: Secondary | ICD-10-CM

## 2019-04-13 DIAGNOSIS — O365930 Maternal care for other known or suspected poor fetal growth, third trimester, not applicable or unspecified: Secondary | ICD-10-CM

## 2019-04-13 DIAGNOSIS — Z3A31 31 weeks gestation of pregnancy: Secondary | ICD-10-CM

## 2019-04-13 DIAGNOSIS — O26893 Other specified pregnancy related conditions, third trimester: Secondary | ICD-10-CM

## 2019-04-13 DIAGNOSIS — O368390 Maternal care for abnormalities of the fetal heart rate or rhythm, unspecified trimester, not applicable or unspecified: Secondary | ICD-10-CM

## 2019-04-13 DIAGNOSIS — O26899 Other specified pregnancy related conditions, unspecified trimester: Secondary | ICD-10-CM

## 2019-04-13 DIAGNOSIS — Z3480 Encounter for supervision of other normal pregnancy, unspecified trimester: Secondary | ICD-10-CM

## 2019-04-13 DIAGNOSIS — O368330 Maternal care for abnormalities of the fetal heart rate or rhythm, third trimester, not applicable or unspecified: Secondary | ICD-10-CM

## 2019-04-13 DIAGNOSIS — Z23 Encounter for immunization: Secondary | ICD-10-CM

## 2019-04-13 DIAGNOSIS — O99013 Anemia complicating pregnancy, third trimester: Secondary | ICD-10-CM

## 2019-04-13 DIAGNOSIS — O99011 Anemia complicating pregnancy, first trimester: Secondary | ICD-10-CM

## 2019-04-13 LAB — POCT URINALYSIS DIPSTICK
Bilirubin, UA: NEGATIVE
Blood, UA: NEGATIVE
Glucose, UA: NEGATIVE
Ketones, UA: NEGATIVE
Leukocytes, UA: NEGATIVE
Protein, UA: NEGATIVE
Spec Grav, UA: 1.02 (ref 1.010–1.025)
Urobilinogen, UA: NEGATIVE E.U./dL — AB
pH, UA: 6.5 (ref 5.0–8.0)

## 2019-04-13 NOTE — Patient Instructions (Signed)
Third Trimester of Pregnancy The third trimester is from week 28 through week 40 (months 7 through 9). The third trimester is a time when the unborn baby (fetus) is growing rapidly. At the end of the ninth month, the fetus is about 20 inches in length and weighs 6-10 pounds. Body changes during your third trimester Your body will continue to go through many changes during pregnancy. The changes vary from woman to woman. During the third trimester:  Your weight will continue to increase. You can expect to gain 25-35 pounds (11-16 kg) by the end of the pregnancy.  You may begin to get stretch marks on your hips, abdomen, and breasts.  You may urinate more often because the fetus is moving lower into your pelvis and pressing on your bladder.  You may develop or continue to have heartburn. This is caused by increased hormones that slow down muscles in the digestive tract.  You may develop or continue to have constipation because increased hormones slow digestion and cause the muscles that push waste through your intestines to relax.  You may develop hemorrhoids. These are swollen veins (varicose veins) in the rectum that can itch or be painful.  You may develop swollen, bulging veins (varicose veins) in your legs.  You may have increased body aches in the pelvis, back, or thighs. This is due to weight gain and increased hormones that are relaxing your joints.  You may have changes in your hair. These can include thickening of your hair, rapid growth, and changes in texture. Some women also have hair loss during or after pregnancy, or hair that feels dry or thin. Your hair will most likely return to normal after your baby is born.  Your breasts will continue to grow and they will continue to become tender. A yellow fluid (colostrum) may leak from your breasts. This is the first milk you are producing for your baby.  Your belly button may stick out.  You may notice more swelling in your hands,  face, or ankles.  You may have increased tingling or numbness in your hands, arms, and legs. The skin on your belly may also feel numb.  You may feel short of breath because of your expanding uterus.  You may have more problems sleeping. This can be caused by the size of your belly, increased need to urinate, and an increase in your body's metabolism.  You may notice the fetus "dropping," or moving lower in your abdomen (lightening).  You may have increased vaginal discharge.  You may notice your joints feel loose and you may have pain around your pelvic bone. What to expect at prenatal visits You will have prenatal exams every 2 weeks until week 36. Then you will have weekly prenatal exams. During a routine prenatal visit:  You will be weighed to make sure you and the baby are growing normally.  Your blood pressure will be taken.  Your abdomen will be measured to track your baby's growth.  The fetal heartbeat will be listened to.  Any test results from the previous visit will be discussed.  You may have a cervical check near your due date to see if your cervix has softened or thinned (effaced).  You will be tested for Group B streptococcus. This happens between 35 and 37 weeks. Your health care provider may ask you:  What your birth plan is.  How you are feeling.  If you are feeling the baby move.  If you have had any abnormal   symptoms, such as leaking fluid, bleeding, severe headaches, or abdominal cramping.  If you are using any tobacco products, including cigarettes, chewing tobacco, and electronic cigarettes.  If you have any questions. Other tests or screenings that may be performed during your third trimester include:  Blood tests that check for low iron levels (anemia).  Fetal testing to check the health, activity level, and growth of the fetus. Testing is done if you have certain medical conditions or if there are problems during the pregnancy.  Nonstress test  (NST). This test checks the health of your baby to make sure there are no signs of problems, such as the baby not getting enough oxygen. During this test, a belt is placed around your belly. The baby is made to move, and its heart rate is monitored during movement. What is false labor? False labor is a condition in which you feel small, irregular tightenings of the muscles in the womb (contractions) that usually go away with rest, changing position, or drinking water. These are called Braxton Hicks contractions. Contractions may last for hours, days, or even weeks before true labor sets in. If contractions come at regular intervals, become more frequent, increase in intensity, or become painful, you should see your health care provider. What are the signs of labor?  Abdominal cramps.  Regular contractions that start at 10 minutes apart and become stronger and more frequent with time.  Contractions that start on the top of the uterus and spread down to the lower abdomen and back.  Increased pelvic pressure and dull back pain.  A watery or bloody mucus discharge that comes from the vagina.  Leaking of amniotic fluid. This is also known as your "water breaking." It could be a slow trickle or a gush. Let your health care provider know if it has a color or strange odor. If you have any of these signs, call your health care provider right away, even if it is before your due date. Follow these instructions at home: Medicines  Follow your health care provider's instructions regarding medicine use. Specific medicines may be either safe or unsafe to take during pregnancy.  Take a prenatal vitamin that contains at least 600 micrograms (mcg) of folic acid.  If you develop constipation, try taking a stool softener if your health care provider approves. Eating and drinking   Eat a balanced diet that includes fresh fruits and vegetables, whole grains, good sources of protein such as meat, eggs, or tofu,  and low-fat dairy. Your health care provider will help you determine the amount of weight gain that is right for you.  Avoid raw meat and uncooked cheese. These carry germs that can cause birth defects in the baby.  If you have low calcium intake from food, talk to your health care provider about whether you should take a daily calcium supplement.  Eat four or five small meals rather than three large meals a day.  Limit foods that are high in fat and processed sugars, such as fried and sweet foods.  To prevent constipation: ? Drink enough fluid to keep your urine clear or pale yellow. ? Eat foods that are high in fiber, such as fresh fruits and vegetables, whole grains, and beans. Activity  Exercise only as directed by your health care provider. Most women can continue their usual exercise routine during pregnancy. Try to exercise for 30 minutes at least 5 days a week. Stop exercising if you experience uterine contractions.  Avoid heavy lifting.  Do   not exercise in extreme heat or humidity, or at high altitudes.  Wear low-heel, comfortable shoes.  Practice good posture.  You may continue to have sex unless your health care provider tells you otherwise. Relieving pain and discomfort  Take frequent breaks and rest with your legs elevated if you have leg cramps or low back pain.  Take warm sitz baths to soothe any pain or discomfort caused by hemorrhoids. Use hemorrhoid cream if your health care provider approves.  Wear a good support bra to prevent discomfort from breast tenderness.  If you develop varicose veins: ? Wear support pantyhose or compression stockings as told by your healthcare provider. ? Elevate your feet for 15 minutes, 3-4 times a day. Prenatal care  Write down your questions. Take them to your prenatal visits.  Keep all your prenatal visits as told by your health care provider. This is important. Safety  Wear your seat belt at all times when driving.  Make  a list of emergency phone numbers, including numbers for family, friends, the hospital, and police and fire departments. General instructions  Avoid cat litter boxes and soil used by cats. These carry germs that can cause birth defects in the baby. If you have a cat, ask someone to clean the litter box for you.  Do not travel far distances unless it is absolutely necessary and only with the approval of your health care provider.  Do not use hot tubs, steam rooms, or saunas.  Do not drink alcohol.  Do not use any products that contain nicotine or tobacco, such as cigarettes and e-cigarettes. If you need help quitting, ask your health care provider.  Do not use any medicinal herbs or unprescribed drugs. These chemicals affect the formation and growth of the baby.  Do not douche or use tampons or scented sanitary pads.  Do not cross your legs for long periods of time.  To prepare for the arrival of your baby: ? Take prenatal classes to understand, practice, and ask questions about labor and delivery. ? Make a trial run to the hospital. ? Visit the hospital and tour the maternity area. ? Arrange for maternity or paternity leave through employers. ? Arrange for family and friends to take care of pets while you are in the hospital. ? Purchase a rear-facing car seat and make sure you know how to install it in your car. ? Pack your hospital bag. ? Prepare the baby's nursery. Make sure to remove all pillows and stuffed animals from the baby's crib to prevent suffocation.  Visit your dentist if you have not gone during your pregnancy. Use a soft toothbrush to brush your teeth and be gentle when you floss. Contact a health care provider if:  You are unsure if you are in labor or if your water has broken.  You become dizzy.  You have mild pelvic cramps, pelvic pressure, or nagging pain in your abdominal area.  You have lower back pain.  You have persistent nausea, vomiting, or  diarrhea.  You have an unusual or bad smelling vaginal discharge.  You have pain when you urinate. Get help right away if:  Your water breaks before 37 weeks.  You have regular contractions less than 5 minutes apart before 37 weeks.  You have a fever.  You are leaking fluid from your vagina.  You have spotting or bleeding from your vagina.  You have severe abdominal pain or cramping.  You have rapid weight loss or weight gain.  You have   shortness of breath with chest pain.  You notice sudden or extreme swelling of your face, hands, ankles, feet, or legs.  Your baby makes fewer than 10 movements in 2 hours.  You have severe headaches that do not go away when you take medicine.  You have vision changes. Summary  The third trimester is from week 28 through week 40, months 7 through 9. The third trimester is a time when the unborn baby (fetus) is growing rapidly.  During the third trimester, your discomfort may increase as you and your baby continue to gain weight. You may have abdominal, leg, and back pain, sleeping problems, and an increased need to urinate.  During the third trimester your breasts will keep growing and they will continue to become tender. A yellow fluid (colostrum) may leak from your breasts. This is the first milk you are producing for your baby.  False labor is a condition in which you feel small, irregular tightenings of the muscles in the womb (contractions) that eventually go away. These are called Braxton Hicks contractions. Contractions may last for hours, days, or even weeks before true labor sets in.  Signs of labor can include: abdominal cramps; regular contractions that start at 10 minutes apart and become stronger and more frequent with time; watery or bloody mucus discharge that comes from the vagina; increased pelvic pressure and dull back pain; and leaking of amniotic fluid. This information is not intended to replace advice given to you by your  health care provider. Make sure you discuss any questions you have with your health care provider. Document Revised: 07/13/2018 Document Reviewed: 04/27/2016 Elsevier Patient Education  2020 [employer]  

## 2019-04-13 NOTE — Progress Notes (Signed)
Subjective:  Jordan [name] is a 29 y.o. G2P1001 at [redacted]w[redacted]d being seen today for ongoing prenatal care.  She is currently monitored for the following issues for this high-risk pregnancy and has Ophthalmoplegic migraine, not intractable; B12 deficiency anemia; Vitamin D deficiency; Supervision of other normal pregnancy, antepartum; Short interval between pregnancies affecting pregnancy in first trimester, antepartum; GBS (group b Streptococcus) UTI complicating pregnancy, first trimester; IUGR (intrauterine growth restriction) affecting care of mother; Fetal arrhythmia affecting pregnancy, antepartum; and Polyhydramnios on their problem list.  Patient reports low abdominal cramping for 1 week. Denies urinary sx. Contractions: Not present. Vag. Bleeding: None.  Movement: Present. Denies leaking of fluid.   The following portions of the patient's history were reviewed and updated as appropriate: allergies, current medications, past family history, past medical history, past social history, past surgical history and problem list. Problem list updated.  Objective:   Vitals:   04/13/19 0808  BP: 110/70  Pulse: 93  Temp: 98 F (36.7 C)  Weight: 151 lb (68.5 kg)    Fetal Status: Fetal Heart Rate (bpm): 154 Fundal Height: 33 cm Movement: Present  Presentation: Vertex  General:  Alert, oriented and cooperative. Patient is in no acute distress.  Skin: Skin is warm and dry. No rash noted.   Cardiovascular: Normal heart rate noted  Respiratory: Normal respiratory effort, no problems with respiration noted  Abdomen: Soft, gravid, appropriate for gestational age. Pain/Pressure: Absent     Pelvic: Vag. Bleeding: None Vag D/C Character: Thick   Cervical exam performed Dilation: Closed Effacement (%): Thick    Extremities: Normal range of motion.  Edema: Trace  Mental Status: Normal mood and affect. Normal behavior. Normal judgment and thought content.   Urinalysis:      Results for orders placed or  performed in visit on 04/13/19 (from the past 24 hour(s))  POCT Urinalysis Dipstick     Status: Abnormal   Collection Time: 04/13/19  8:33 AM  Result Value Ref Range   Color, UA amber    Clarity, UA cloudy    Glucose, UA Negative Negative   Bilirubin, UA neg    Ketones, UA neg    Spec Grav, UA 1.020 1.010 - 1.025   Blood, UA neg    pH, UA 6.5 5.0 - 8.0   Protein, UA Negative Negative   Urobilinogen, UA negative (A) 0.2 or 1.0 E.U./dL   Nitrite, UA     Leukocytes, UA Negative Negative   Appearance     Odor     Assessment and Plan:  Pregnancy: G2P1001 at [redacted]w[redacted]d  1. Supervision of other normal pregnancy, antepartum - CBC - HIV antibody (with reflex) - RPR - 2 Hour GTT - Culture, OB Urine - POCT Urinalysis Dipstick  2. Poor fetal growth affecting management of mother in third trimester, single or unspecified fetus - short femur - growth [place] scheduled  3. Fetal arrhythmia affecting pregnancy, antepartum - has repeat fetal ECHO scheduled  4. Polyhydramnios in third trimester complication, single or unspecified fetus - f/u at next [place]  5. Abdominal cramping in pregnancy - no signs of PTL - denies urinary sx - UA negative - will send UC, has hx of GBS in urine - increase water intake, add cranberry juice  Preterm labor symptoms and general obstetric precautions including but not limited to vaginal bleeding, contractions, leaking of fluid and fetal movement were reviewed in detail with the patient. Please refer to After Visit Summary for other counseling recommendations.  Return in about 3  weeks (around 05/04/2019).- virtual visit   Donette Larry, CNM

## 2019-04-16 LAB — CBC
HCT: 33 % — ABNORMAL LOW (ref 35.0–45.0)
Hemoglobin: 11.3 g/dL — ABNORMAL LOW (ref 11.7–15.5)
MCH: 33.3 pg — ABNORMAL HIGH (ref 27.0–33.0)
MCHC: 34.2 g/dL (ref 32.0–36.0)
MCV: 97.3 fL (ref 80.0–100.0)
MPV: 11.5 fL (ref 7.5–12.5)
Platelets: 179 10*3/uL (ref 140–400)
RBC: 3.39 10*6/uL — ABNORMAL LOW (ref 3.80–5.10)
RDW: 12.3 % (ref 11.0–15.0)
WBC: 11 10*3/uL — ABNORMAL HIGH (ref 3.8–10.8)

## 2019-04-16 LAB — RPR: RPR Ser Ql: NONREACTIVE

## 2019-04-16 LAB — 2HR GTT W 1 HR, CARPENTER, 75 G
Glucose, 1 Hr, Gest: 101 mg/dL (ref 65–179)
Glucose, 2 Hr, Gest: 75 mg/dL (ref 65–152)
Glucose, Fasting, Gest: 74 mg/dL (ref 65–91)

## 2019-04-16 LAB — HIV ANTIBODY (ROUTINE TESTING W REFLEX): HIV 1&2 Ab, 4th Generation: NONREACTIVE

## 2019-04-17 LAB — URINE CULTURE, OB REFLEX

## 2019-04-17 LAB — CULTURE, OB URINE

## 2019-04-20 ENCOUNTER — Encounter (HOSPITAL_COMMUNITY): Payer: Self-pay

## 2019-04-24 ENCOUNTER — Encounter (HOSPITAL_COMMUNITY): Payer: Self-pay

## 2019-04-27 ENCOUNTER — Ambulatory Visit (HOSPITAL_COMMUNITY)
Admission: RE | Admit: 2019-04-27 | Discharge: 2019-04-27 | Disposition: A | Discharge status: Home / Self Care | Payer: No Typology Code available for payment source | Source: Ambulatory Visit | Attending: Obstetrics and Gynecology | Admitting: Obstetrics and Gynecology

## 2019-04-27 ENCOUNTER — Ambulatory Visit (HOSPITAL_COMMUNITY): Payer: No Typology Code available for payment source | Admitting: *Deleted

## 2019-04-27 ENCOUNTER — Other Ambulatory Visit: Payer: Self-pay

## 2019-04-27 ENCOUNTER — Other Ambulatory Visit (HOSPITAL_COMMUNITY): Payer: Self-pay | Admitting: *Deleted

## 2019-04-27 ENCOUNTER — Encounter (HOSPITAL_COMMUNITY): Payer: Self-pay

## 2019-04-27 DIAGNOSIS — Z362 Encounter for other antenatal screening follow-up: Secondary | ICD-10-CM

## 2019-04-27 DIAGNOSIS — B951 Streptococcus, group B, as the cause of diseases classified elsewhere: Secondary | ICD-10-CM | POA: Diagnosis present

## 2019-04-27 DIAGNOSIS — O403XX0 Polyhydramnios, third trimester, not applicable or unspecified: Secondary | ICD-10-CM

## 2019-04-27 DIAGNOSIS — O2341 Unspecified infection of urinary tract in pregnancy, first trimester: Secondary | ICD-10-CM | POA: Diagnosis present

## 2019-04-27 DIAGNOSIS — O368390 Maternal care for abnormalities of the fetal heart rate or rhythm, unspecified trimester, not applicable or unspecified: Secondary | ICD-10-CM | POA: Diagnosis present

## 2019-04-27 DIAGNOSIS — O368330 Maternal care for abnormalities of the fetal heart rate or rhythm, third trimester, not applicable or unspecified: Secondary | ICD-10-CM

## 2019-04-27 DIAGNOSIS — O365930 Maternal care for other known or suspected poor fetal growth, third trimester, not applicable or unspecified: Secondary | ICD-10-CM

## 2019-04-27 DIAGNOSIS — O409XX0 Polyhydramnios, unspecified trimester, not applicable or unspecified: Secondary | ICD-10-CM | POA: Diagnosis not present

## 2019-04-27 DIAGNOSIS — Z3A33 33 weeks gestation of pregnancy: Secondary | ICD-10-CM

## 2019-04-27 DIAGNOSIS — Z3480 Encounter for supervision of other normal pregnancy, unspecified trimester: Secondary | ICD-10-CM | POA: Insufficient documentation

## 2019-05-04 ENCOUNTER — Telehealth (INDEPENDENT_AMBULATORY_CARE_PROVIDER_SITE_OTHER): Payer: No Typology Code available for payment source | Admitting: Certified Nurse Midwife

## 2019-05-04 ENCOUNTER — Encounter: Payer: Self-pay | Admitting: Certified Nurse Midwife

## 2019-05-04 VITALS — BP 104/73 | Wt 154.0 lb

## 2019-05-04 DIAGNOSIS — O09893 Supervision of other high risk pregnancies, third trimester: Secondary | ICD-10-CM

## 2019-05-04 DIAGNOSIS — O368330 Maternal care for abnormalities of the fetal heart rate or rhythm, third trimester, not applicable or unspecified: Secondary | ICD-10-CM

## 2019-05-04 DIAGNOSIS — O403XX0 Polyhydramnios, third trimester, not applicable or unspecified: Secondary | ICD-10-CM

## 2019-05-04 DIAGNOSIS — B951 Streptococcus, group B, as the cause of diseases classified elsewhere: Secondary | ICD-10-CM

## 2019-05-04 DIAGNOSIS — O09891 Supervision of other high risk pregnancies, first trimester: Secondary | ICD-10-CM

## 2019-05-04 DIAGNOSIS — Z3480 Encounter for supervision of other normal pregnancy, unspecified trimester: Secondary | ICD-10-CM

## 2019-05-04 DIAGNOSIS — Z3A34 34 weeks gestation of pregnancy: Secondary | ICD-10-CM

## 2019-05-04 DIAGNOSIS — O368390 Maternal care for abnormalities of the fetal heart rate or rhythm, unspecified trimester, not applicable or unspecified: Secondary | ICD-10-CM

## 2019-05-04 DIAGNOSIS — O2343 Unspecified infection of urinary tract in pregnancy, third trimester: Secondary | ICD-10-CM

## 2019-05-04 DIAGNOSIS — O365930 Maternal care for other known or suspected poor fetal growth, third trimester, not applicable or unspecified: Secondary | ICD-10-CM

## 2019-05-04 DIAGNOSIS — O409XX0 Polyhydramnios, unspecified trimester, not applicable or unspecified: Secondary | ICD-10-CM

## 2019-05-04 NOTE — Progress Notes (Signed)
TELEHEALTH OBSTETRICS PRENATAL VIRTUAL VIDEO VISIT ENCOUNTER NOTE  Provider location: [name] [place]   I connected with [name] on 05/04/19 at  8:30 AM EST by MyChart Video Encounter at home and verified that I am speaking with the correct person using two identifiers.   I discussed the limitations, risks, security and privacy concerns of performing an evaluation and management service virtually and the availability of in person appointments. I also discussed with the patient that there may be a patient responsible charge related to this service. The patient expressed understanding and agreed to proceed. Subjective:  Jordan [name] is a 29 y.o. G2P1001 at [redacted]w[redacted]d being seen today for ongoing prenatal care.  She is currently monitored for the following issues for this high-risk pregnancy and has Ophthalmoplegic migraine, not intractable; B12 deficiency anemia; Vitamin D deficiency; Supervision of other normal pregnancy, antepartum; Short interval between pregnancies affecting pregnancy in first trimester, antepartum; GBS (group b Streptococcus) UTI complicating pregnancy, first trimester; IUGR (intrauterine growth restriction) affecting care of mother; Fetal arrhythmia affecting pregnancy, antepartum; and Polyhydramnios on their problem list.  Patient reports occasional contractions.  Contractions: Not present. Vag. Bleeding: None.  Movement: Present. Denies any leaking of fluid.   The following portions of the patient's history were reviewed and updated as appropriate: allergies, current medications, past family history, past medical history, past social history, past surgical history and problem list.   Objective:   Vitals:   05/04/19 0821  BP: 104/73  Weight: 154 lb (69.9 kg)    Fetal Status:     Movement: Present     General:  Alert, oriented and cooperative. Patient is in no acute distress.  Respiratory: Normal respiratory effort, no problems with  respiration noted  Mental Status: Normal mood and affect. Normal behavior. Normal judgment and thought content.  Rest of physical exam deferred due to type of encounter  Imaging: [place] MFM OB FOLLOW UP  Result Date: 04/27/2019 ----------------------------------------------------------------------  OBSTETRICS REPORT                       (Signed Final 04/27/2019 09:09 am) ---------------------------------------------------------------------- Patient Info  ID #:       [id number]                          D.O.B.:  1990/12/04 (29 yrs)  Name:       Jordan [name]                  Visit Date: 04/27/2019 08:34 am ---------------------------------------------------------------------- Performed By  Performed By:     Emeline Darling BS,      Ref. Address:     [address]                                                             [place], [place]  Attending:        Ma Rings MD         Location:         [name]  Referred By:      Everardo All ---------------------------------------------------------------------- Orders   #  Description                          Code         Ordered By   1  [place] MFM OB FOLLOW UP                  [phone]     YU FANG  ----------------------------------------------------------------------   #  Order #                    Accession #                 Episode #   1  [id number]                  [id number]                  [id number]  ---------------------------------------------------------------------- Indications   Maternal care for known or suspected poor      O36.5930   fetal growth, third trimester, not applicable or   unspecified IUGR   Fetal arrhythmia affecting pregnancy,          O36.8390   antepartum   Encounter for other antenatal screening        Z36.2   follow-up   Polyhydramnios, third trimester, antepartum    O40.3XX0   condition or complication, unspecified fetus   [redacted] weeks  gestation of pregnancy                Z3A.33   Low Risk NIPS (Negative AFP) (ECHO wnl)  ---------------------------------------------------------------------- Fetal Evaluation  Num Of Fetuses:         1  Fetal Heart Rate(bpm):  129  Cardiac Activity:       Observed  Presentation:           Cephalic  Placenta:               Posterior  P. Cord Insertion:      Previously Visualized  Amniotic Fluid  AFI FV:      Within normal limits  AFI Sum(cm)     %Tile       Largest Pocket(cm)  22.92           88          6.63  RUQ(cm)       RLQ(cm)       LUQ(cm)        LLQ(cm)  6.35          4.34          6.63           5.6 ---------------------------------------------------------------------- Biometry  BPD:      86.9  mm     G. Age:  35w 1d         91  %    CI:        80.27   %    70 - 86                                                          FL/HC:      18.9   %  19.9 - 21.5  HC:      306.4  mm     G. Age:  34w 1d         38  %    HC/AC:      1.06        0.96 - 1.11  AC:      289.3  mm     G. Age:  33w 0d         36  %    FL/BPD:     66.6   %    71 - 87  FL:       57.9  mm     G. Age:  30w 2d        < 1  %    FL/AC:      20.0   %    20 - 24  Est. FW:    1980  gm      4 lb 6 oz     22  % ---------------------------------------------------------------------- OB History  Gravidity:    2         Term:   1        Prem:   0        SAB:   0  TOP:          0       Ectopic:  0        Living: 1 ---------------------------------------------------------------------- Gestational Age  LMP:           33w 1d        Date:  09/07/18                 EDD:   06/14/19  U/S Today:     33w 1d                                        EDD:   06/14/19  Best:          33w 1d     Det. By:  LMP  (09/07/18)          EDD:   06/14/19 ---------------------------------------------------------------------- Anatomy  Cranium:               Appears normal         LVOT:                   Previously seen  Cavum:                 Previously seen        Aortic Arch:             Previously seen  Ventricles:            Appears normal         Ductal Arch:            Previously seen  Choroid Plexus:        Previously seen        Diaphragm:              Previously seen  Cerebellum:            Previously seen        Stomach:                Appears normal, left  sided  Posterior Fossa:       Previously seen        Abdomen:                Previously seen  Nuchal Fold:           Previously seen        Abdominal Wall:         Previously seen  Face:                  Orbits and profile     Cord Vessels:           Previously seen                         previously seen  Lips:                  Previously seen        Kidneys:                Appear normal  Palate:                Previously seen        Bladder:                Appears normal  Thoracic:              Appears normal         Spine:                  Previously seen  Heart:                 Previously seen        Upper Extremities:      Previously seen  RVOT:                  Previously seen        Lower Extremities:      Previously seen  Other:  Heels/feet and open hands/5th digits previously visualized. Nasal          bone visualized previously. ---------------------------------------------------------------------- Cervix Uterus Adnexa  Cervix  Not visualized (advanced GA >24wks) ---------------------------------------------------------------------- Comments  This patient was seen for a follow up growth scan.  She  denies any problems since her last exam.  She was informed that the fetal growth and amniotic fluid  level appears appropriate for her gestational age.  The femur  lengths continue to measure short.  A follow up exam was scheduled in 4 weeks. ----------------------------------------------------------------------                   Ma Rings, MD Electronically Signed Final Report   04/27/2019 09:09 am  ----------------------------------------------------------------------   Assessment and Plan:  Pregnancy: G2P1001 at [redacted]w[redacted]d 1. Supervision of other normal pregnancy, antepartum - Patient doing well, no complaints - Reports occasional contractions while she is at work, but after she gets off and home/rest contractions stop. Denies them being painful  - Anticipatory guidance on upcoming appointments with GC/C at next appointment  - Preterm labor precautions discussed  - Patient reports getting 1st COVID vaccination last week and next one is scheduled for 2/12   2. Poor fetal growth affecting management of mother in third trimester, single or unspecified fetus - [place] on 1/22 notes appropriate fetal growth and amniotic fluid for gestational age  - femur length continue to measure short at <1% - patient is 4'11"  -  Est. FW: 1980  gm   4 lb 6 oz   22  % - follow up US scheduled for 2/19 for fetal growth   3. Polyhydramnios, antepartum, single or unspecified fetus - Mild poly noted on 12/4 US - Recent US on 1/22 noted WNL AFI and poly resolved   4. Short interval between pregnancies affecting pregnancy in first trimester, antepartum - Recent delivery December 2019  5. Fetal arrhythmia affecting pregnancy, antepartum - Repeat ECHO completed on 04/19/18 - Upon chart review under media, results noted redundant FO seen billowing into left atrium, patent ductus arteriosus, trivial tricuspid valve regurgitation  - Suggest postnatal ECG to confirm heart rate   6. GBS (group b Streptococcus) UTI complicating pregnancy, first trimester - treat during labor    Preterm labor symptoms and general obstetric precautions including but not limited to vaginal bleeding, contractions, leaking of fluid and fetal movement were reviewed in detail with the patient. I discussed the assessment and treatment plan with the patient. The patient was provided an opportunity to ask questions and all were answered. The  patient agreed with the plan and demonstrated an understanding of the instructions. The patient was advised to call back or seek an in-person office evaluation/[name] for any urgent or concerning symptoms. Please refer to After Visit Summary for other counseling recommendations.   I provided 11 minutes of face-to-face time during this encounter.  Return in about 2 weeks (around 05/18/2019) for ROB - in person .  Future Appointments  Date Time Provider Department [name]  05/25/2019  8:15 AM [facility]-M[facility] NURSE WH-MFC MFC-US  05/25/2019  8:30 AM WH-MFC US 1 WH-MFCUS MFC-US  08/27/2019  8:30 AM Butch PennyMillikan, Megan, NP GNA-GNA None    Sharyon CableVeronica C Hong Moring, CNM [name]

## 2019-05-04 NOTE — Patient Instructions (Signed)
Places to have your son circumcised:                                                                      [facility]     [phone]   $[phone] while you are in hospital         [facility]              [phone]   $[phone] by 4 wks                      [facility]                     [phone]   $[phone] by 7 days [facility]                    [phone]   $269 by 4 wks [facility]             [phone]   $[phone] by 2 wks    These prices sometimes change but are roughly what you can expect to pay. Please call and confirm pricing.   Circumcision is considered an elective/non-medically necessary procedure. There are many reasons parents decide to have their sons circumsized. During the first year of life circumcised males have a reduced risk of urinary tract infections but after this year the rates between circumcised males and uncircumcised males are the same.  It is safe to have your son circumcised outside of the hospital and the places above perform them regularly.   Deciding about Circumcision in Baby Boys  (Up-to-date The Basics)  What is circumcision?   Circumcision is a surgery that removes the skin that covers the tip of the penis, called the "foreskin" Circumcision is usually done when a boy is between 1 and 10 days old. In the [place], circumcision is common. In some other countries, fewer boys are circumcised. Circumcision is a common tradition in some religions.  Should I have my baby boy circumcised?   There is no easy answer. Circumcision has some benefits. But it also has risks. After talking with your doctor, you will have to decide for yourself what is right for your family.  What are the benefits of circumcision?   Circumcised boys seem to have slightly lower rates of: ?Urinary tract infections ?Swelling of the opening at the tip of the penis Circumcised men seem to have slightly lower rates of: ?Urinary tract infections ?Swelling of the opening at the tip of the penis ?Penis cancer ?HIV  and other infections that you catch during sex ?Cervical cancer in the women they have sex with Even so, in the [place], the risks of these problems are small - even in boys and men who have not been circumcised. Plus, boys and men who are not circumcised can reduce these extra risks by: ?Cleaning their penis well ?Using condoms during sex  What are the risks of circumcision?  Risks include: ?Bleeding or infection from the surgery ?Damage to or amputation of the penis ?A chance that the doctor will cut off too much or not enough of the foreskin ?A chance that sex won't feel as good later in life Only about 1 out of every 200   circumcisions leads to problems. There is also a chance that your health insurance won't pay for circumcision.  How is circumcision done in baby boys?  First, the baby gets medicine for pain relief. This might be a cream on the skin or a shot into the base of the penis. Next, the doctor cleans the baby's penis well. Then he or she uses special tools to cut off the foreskin. Finally, the doctor wraps a bandage (called gauze) around the baby's penis. If you have your baby circumcised, his doctor or nurse will give you instructions on how to care for him after the surgery. It is important that you follow those instructions carefully.  

## 2019-05-18 ENCOUNTER — Other Ambulatory Visit: Payer: Self-pay

## 2019-05-18 ENCOUNTER — Ambulatory Visit (INDEPENDENT_AMBULATORY_CARE_PROVIDER_SITE_OTHER): Payer: No Typology Code available for payment source | Admitting: Obstetrics and Gynecology

## 2019-05-18 VITALS — BP 106/72 | HR 92 | Temp 97.6°F | Wt 156.0 lb

## 2019-05-18 DIAGNOSIS — Z3A36 36 weeks gestation of pregnancy: Secondary | ICD-10-CM

## 2019-05-18 DIAGNOSIS — Z113 Encounter for screening for infections with a predominantly sexual mode of transmission: Secondary | ICD-10-CM

## 2019-05-18 DIAGNOSIS — Z3480 Encounter for supervision of other normal pregnancy, unspecified trimester: Secondary | ICD-10-CM

## 2019-05-18 NOTE — Progress Notes (Signed)
Pt received 2nd Covid vaccine this AM @ [facility]

## 2019-05-18 NOTE — Progress Notes (Signed)
   PRENATAL VISIT NOTE  Subjective:  Jordan [name] is a 29 y.o. G2P1001 at [redacted]w[redacted]d being seen today for ongoing prenatal care.  She is currently monitored for the following issues for this low-risk pregnancy and has Ophthalmoplegic migraine, not intractable; B12 deficiency anemia; Vitamin D deficiency; Supervision of other normal pregnancy, antepartum; Short interval between pregnancies affecting pregnancy in first trimester, antepartum; GBS (group b Streptococcus) UTI complicating pregnancy, first trimester; IUGR (intrauterine growth restriction) affecting care of mother; Fetal arrhythmia affecting pregnancy, antepartum; and Polyhydramnios on their problem list.  Patient reports no complaints.   . Vag. Bleeding: None.  Movement: Present. Denies leaking of fluid.   The following portions of the patient's history were reviewed and updated as appropriate: allergies, current medications, past family history, past medical history, past social history, past surgical history and problem list.   Objective:   Vitals:   05/18/19 0917  BP: 106/72  Pulse: 92  Temp: 97.6 F (36.4 C)  Weight: 156 lb (70.8 kg)    Fetal Status: Fetal Heart Rate (bpm): 158 Fundal Height: 36 cm Movement: Present  Presentation: Vertex  General:  Alert, oriented and cooperative. Patient is in no acute distress.  Skin: Skin is warm and dry. No rash noted.   Cardiovascular: Normal heart rate noted  Respiratory: Normal respiratory effort, no problems with respiration noted  Abdomen: Soft, gravid, appropriate for gestational age.  Pain/Pressure: Present     Pelvic: Cervical exam performed Dilation: 1.5 Effacement (%): 60 Station: -3  Extremities: Normal range of motion.  Edema: Trace  Mental Status: Normal mood and affect. Normal behavior. Normal judgment and thought content.   Assessment and Plan:  Pregnancy: G2P1001 at [redacted]w[redacted]d 1. Supervision of other normal pregnancy, antepartum  - Culture, beta strep (group b only) -  Cervicovaginal ancillary only( [place]) -Repeat fetal echo OK, plan for fetal NST after delivery.  F/u US in 4 weeks for growth.   Preterm labor symptoms and general obstetric precautions including but not limited to vaginal bleeding, contractions, leaking of fluid and fetal movement were reviewed in detail with the patient. Please refer to After Visit Summary for other counseling recommendations.   Return in about 1 week (around 05/25/2019) for virtual visit. .  Future Appointments  Date Time Provider Department Center  05/23/2019  1:10 PM Currie Paris, NP [facility]  05/25/2019  8:15 AM WH-MFC NURSE WH-MFC MFC-US  05/25/2019  8:30 AM WH-MFC [place] 1 WH-MFCUS MFC-US  08/27/2019  8:30 AM Butch Penny, NP [facility] None    Venia Carbon, NP

## 2019-05-18 NOTE — Patient Instructions (Signed)

## 2019-05-19 LAB — CULTURE, BETA STREP (GROUP B ONLY)
MICRO NUMBER:: 10146796
SPECIMEN QUALITY:: ADEQUATE

## 2019-05-21 LAB — CERVICOVAGINAL ANCILLARY ONLY
Chlamydia: NEGATIVE
Comment: NEGATIVE
Comment: NORMAL
Neisseria Gonorrhea: NEGATIVE

## 2019-05-23 ENCOUNTER — Telehealth (INDEPENDENT_AMBULATORY_CARE_PROVIDER_SITE_OTHER): Payer: No Typology Code available for payment source | Admitting: Nurse Practitioner

## 2019-05-23 DIAGNOSIS — O368390 Maternal care for abnormalities of the fetal heart rate or rhythm, unspecified trimester, not applicable or unspecified: Secondary | ICD-10-CM

## 2019-05-23 DIAGNOSIS — O368330 Maternal care for abnormalities of the fetal heart rate or rhythm, third trimester, not applicable or unspecified: Secondary | ICD-10-CM

## 2019-05-23 DIAGNOSIS — O403XX0 Polyhydramnios, third trimester, not applicable or unspecified: Secondary | ICD-10-CM

## 2019-05-23 DIAGNOSIS — O409XX0 Polyhydramnios, unspecified trimester, not applicable or unspecified: Secondary | ICD-10-CM

## 2019-05-23 DIAGNOSIS — Z3480 Encounter for supervision of other normal pregnancy, unspecified trimester: Secondary | ICD-10-CM

## 2019-05-23 DIAGNOSIS — O365930 Maternal care for other known or suspected poor fetal growth, third trimester, not applicable or unspecified: Secondary | ICD-10-CM

## 2019-05-23 DIAGNOSIS — B951 Streptococcus, group B, as the cause of diseases classified elsewhere: Secondary | ICD-10-CM

## 2019-05-23 DIAGNOSIS — Z3A36 36 weeks gestation of pregnancy: Secondary | ICD-10-CM

## 2019-05-23 DIAGNOSIS — O2343 Unspecified infection of urinary tract in pregnancy, third trimester: Secondary | ICD-10-CM

## 2019-05-23 DIAGNOSIS — O2341 Unspecified infection of urinary tract in pregnancy, first trimester: Secondary | ICD-10-CM

## 2019-05-23 NOTE — Progress Notes (Signed)
I connected with@ on 05/23/19 at  1:10 PM EST by: MyChart and verified that I am speaking with the correct person using two identifiers.  Patient is located at home and provider is located at [facility].     The purpose of this virtual visit is to provide medical care while limiting exposure to the novel coronavirus. I discussed the limitations, risks, security and privacy concerns of performing an evaluation and management service by Mychart and the availability of in person appointments. I also discussed with the patient that there may be a patient responsible charge related to this service. By engaging in this virtual visit, you consent to the provision of healthcare.  Additionally, you authorize for your insurance to be billed for the services provided during this visit.  The patient expressed understanding and agreed to proceed.  The following staff members participated in the virtual visit:  Nolene Bernheim, NP    PRENATAL VISIT NOTE  Subjective:  Jordan [name] is a 29 y.o. G2P1001 at [redacted]w[redacted]d  for phone visit for ongoing prenatal care.  She is currently monitored for the following issues for this low-risk pregnancy and has Ophthalmoplegic migraine, not intractable; B12 deficiency anemia; Vitamin D deficiency; Supervision of other normal pregnancy, antepartum; Short interval between pregnancies affecting pregnancy in first trimester, antepartum; GBS (group b Streptococcus) UTI complicating pregnancy, first trimester; IUGR (intrauterine growth restriction) affecting care of mother; Fetal arrhythmia affecting pregnancy, antepartum; and Polyhydramnios on their problem list.  Patient reports asked question about whether she needs testing for CMV - had exposure in NICU in December when she was around 28 weeks and another possible exposure recently but the testing for the NICU baby is not back yet..  Contractions: Not present. Vag. Bleeding: None.  Movement: Present. Denies leaking of fluid.   The  following portions of the patient's history were reviewed and updated as appropriate: allergies, current medications, past family history, past medical history, past social history, past surgical history and problem list.   Objective:   Vitals:   05/23/19 1318  BP: 111/71  Pulse: 88  Weight: 157 lb (71.2 kg)   Self-Obtained  Fetal Status:     Movement: Present     Assessment and Plan:  Pregnancy: G2P1001 at [redacted]w[redacted]d 1. Poor fetal growth affecting management of mother in third trimester, single or unspecified fetus Short femur but otherwise appropriate growth and appropriate fluid  2. Fetal arrhythmia affecting pregnancy, antepartum Fetal Echo done and will have newborn EKG before leaving the hospital.  3. Polyhydramnios, antepartum, single or unspecified fetus Resolved  4. GBS (group b Streptococcus) UTI complicating pregnancy, first trimester Will treat in labor  5. Supervision of other normal pregnancy, antepartum Asking questions about CMV testing.  Consulted Dr. Vergie Living who will respond to message later today.  Preterm labor symptoms and general obstetric precautions including but not limited to vaginal bleeding, contractions, leaking of fluid and fetal movement were reviewed in detail with the patient.  Return in about 1 week (around 05/30/2019) for virtual visit.  Future Appointments  Date Time Provider Department Center  05/25/2019  8:15 AM [facility] NURSE WH-M[facility] MFC-US  05/25/2019  8:30 AM WH-MFC [place] 1 WH-MFCUS MFC-US  08/27/2019  8:30 AM Butch Penny, NP G[facility] None     Time spent on virtual visit: 8 minutes  Jordan Paris, NP

## 2019-05-25 ENCOUNTER — Ambulatory Visit (HOSPITAL_COMMUNITY): Payer: No Typology Code available for payment source

## 2019-05-30 ENCOUNTER — Telehealth (INDEPENDENT_AMBULATORY_CARE_PROVIDER_SITE_OTHER): Payer: No Typology Code available for payment source | Admitting: Obstetrics and Gynecology

## 2019-05-30 DIAGNOSIS — O409XX0 Polyhydramnios, unspecified trimester, not applicable or unspecified: Secondary | ICD-10-CM

## 2019-05-30 DIAGNOSIS — O365930 Maternal care for other known or suspected poor fetal growth, third trimester, not applicable or unspecified: Secondary | ICD-10-CM

## 2019-05-30 DIAGNOSIS — O2341 Unspecified infection of urinary tract in pregnancy, first trimester: Secondary | ICD-10-CM

## 2019-05-30 DIAGNOSIS — Z3480 Encounter for supervision of other normal pregnancy, unspecified trimester: Secondary | ICD-10-CM

## 2019-05-30 DIAGNOSIS — Z3A37 37 weeks gestation of pregnancy: Secondary | ICD-10-CM

## 2019-05-30 DIAGNOSIS — O368390 Maternal care for abnormalities of the fetal heart rate or rhythm, unspecified trimester, not applicable or unspecified: Secondary | ICD-10-CM

## 2019-05-30 DIAGNOSIS — B951 Streptococcus, group B, as the cause of diseases classified elsewhere: Secondary | ICD-10-CM

## 2019-05-30 NOTE — Progress Notes (Signed)
TELEHEALTH OBSTETRICS PRENATAL VIRTUAL VIDEO VISIT ENCOUNTER NOTE  Provider location: [facility] [place]   I connected with Jordan [name] on 05/30/19 at  9:55 AM EST by WebEx Video Encounter at home and verified that I am speaking with the correct person using two identifiers.   I discussed the limitations, risks, security and privacy concerns of performing an evaluation and management service virtually and the availability of in person appointments. I also discussed with the patient that there may be a patient responsible charge related to this service. The patient expressed understanding and agreed to proceed. Subjective:  Jordan [name] is a 29 y.o. G2P1001 at [redacted]w[redacted]d being seen today for ongoing prenatal care.  She is currently monitored for the following issues for this low-risk pregnancy and has Ophthalmoplegic migraine, not intractable; B12 deficiency anemia; Vitamin D deficiency; Supervision of other normal pregnancy, antepartum; Short interval between pregnancies affecting pregnancy in first trimester, antepartum; GBS (group b Streptococcus) UTI complicating pregnancy, first trimester; IUGR (intrauterine growth restriction) affecting care of mother; Fetal arrhythmia affecting pregnancy, antepartum; and Polyhydramnios on their problem list.  Patient reports no complaints.  Contractions: Irritability. Vag. Bleeding: None.  Movement: Present. Denies any leaking of fluid.   The following portions of the patient's history were reviewed and updated as appropriate: allergies, current medications, past family history, past medical history, past social history, past surgical history and problem list.   Objective:   Vitals:   05/30/19 1027  BP: 108/75  Weight: 159 lb (72.1 kg)    Fetal Status:     Movement: Present     General:  Alert, oriented and cooperative. Patient is in no acute distress.  Respiratory: Normal respiratory effort, no problems with respiration  noted  Mental Status: Normal mood and affect. Normal behavior. Normal judgment and thought content.  Rest of physical exam deferred due to type of encounter  Imaging: No results found.  Assessment and Plan:  Pregnancy: G2P1001 at [redacted]w[redacted]d 1. Poor fetal growth affecting management of mother in third trimester, single or unspecified fetus  Growth [place] on Friday with MFM   2. Fetal arrhythmia affecting pregnancy, antepartum  Fetal echo normal  EKG postpartum on baby.   3. Polyhydramnios, antepartum, single or unspecified fetus  AFI 22.92  4. GBS (group b Streptococcus) UTI complicating pregnancy, first trimester  Discussed she is aware   5. Supervision of other normal pregnancy, antepartum  Would ike to discuss induction @ 40 weeks.   Term labor symptoms and general obstetric precautions including but not limited to vaginal bleeding, contractions, leaking of fluid and fetal movement were reviewed in detail with the patient. I discussed the assessment and treatment plan with the patient. The patient was provided an opportunity to ask questions and all were answered. The patient agreed with the plan and demonstrated an understanding of the instructions. The patient was advised to call back or seek an in-person office evaluation/go to MAU at [facility] for any urgent or concerning symptoms. Please refer to After Visit Summary for other counseling recommendations.   I provided 10 minutes of face-to-face time during this encounter.  Return in about 1 week (around 06/06/2019) for For in person visit. .  Future Appointments  Date Time Provider Department Center  06/01/2019  1:15 PM [facility]-M[facility] NURSE [facility] M[facility]-US  06/01/2019  1:15 PM WH-MFC [place] 4 WH-MFCUS MFC-US  08/27/2019  8:30 AM Butch Penny, NP [facility] None    Venia Carbon, NP [facility]  [facility], [place]

## 2019-06-01 ENCOUNTER — Ambulatory Visit (HOSPITAL_COMMUNITY)
Admission: RE | Admit: 2019-06-01 | Discharge: 2019-06-01 | Disposition: A | Discharge status: Home / Self Care | Payer: No Typology Code available for payment source | Source: Ambulatory Visit | Attending: Obstetrics | Admitting: Obstetrics

## 2019-06-01 ENCOUNTER — Encounter (HOSPITAL_COMMUNITY): Payer: Self-pay

## 2019-06-01 ENCOUNTER — Other Ambulatory Visit: Payer: Self-pay

## 2019-06-01 ENCOUNTER — Ambulatory Visit (HOSPITAL_COMMUNITY): Payer: No Typology Code available for payment source | Admitting: *Deleted

## 2019-06-01 VITALS — BP 119/77 | HR 103 | Temp 97.3°F

## 2019-06-01 DIAGNOSIS — Z3480 Encounter for supervision of other normal pregnancy, unspecified trimester: Secondary | ICD-10-CM

## 2019-06-01 DIAGNOSIS — O365930 Maternal care for other known or suspected poor fetal growth, third trimester, not applicable or unspecified: Secondary | ICD-10-CM | POA: Insufficient documentation

## 2019-06-01 DIAGNOSIS — B951 Streptococcus, group B, as the cause of diseases classified elsewhere: Secondary | ICD-10-CM | POA: Insufficient documentation

## 2019-06-01 DIAGNOSIS — O368330 Maternal care for abnormalities of the fetal heart rate or rhythm, third trimester, not applicable or unspecified: Secondary | ICD-10-CM | POA: Diagnosis not present

## 2019-06-01 DIAGNOSIS — O368390 Maternal care for abnormalities of the fetal heart rate or rhythm, unspecified trimester, not applicable or unspecified: Secondary | ICD-10-CM | POA: Insufficient documentation

## 2019-06-01 DIAGNOSIS — O2341 Unspecified infection of urinary tract in pregnancy, first trimester: Secondary | ICD-10-CM

## 2019-06-01 DIAGNOSIS — Z362 Encounter for other antenatal screening follow-up: Secondary | ICD-10-CM

## 2019-06-01 DIAGNOSIS — Z3A38 38 weeks gestation of pregnancy: Secondary | ICD-10-CM | POA: Diagnosis not present

## 2019-06-03 ENCOUNTER — Other Ambulatory Visit: Payer: Self-pay

## 2019-06-03 ENCOUNTER — Inpatient Hospital Stay (HOSPITAL_COMMUNITY)
Admission: AD | Admit: 2019-06-03 | Discharge: 2019-06-03 | Disposition: A | Discharge status: Home / Self Care | Payer: No Typology Code available for payment source | Attending: Obstetrics & Gynecology | Admitting: Obstetrics & Gynecology

## 2019-06-03 ENCOUNTER — Encounter (HOSPITAL_COMMUNITY): Payer: Self-pay | Admitting: Obstetrics & Gynecology

## 2019-06-03 DIAGNOSIS — Z0371 Encounter for suspected problem with amniotic cavity and membrane ruled out: Secondary | ICD-10-CM

## 2019-06-03 DIAGNOSIS — O26893 Other specified pregnancy related conditions, third trimester: Secondary | ICD-10-CM | POA: Diagnosis not present

## 2019-06-03 DIAGNOSIS — Z3A38 38 weeks gestation of pregnancy: Secondary | ICD-10-CM

## 2019-06-03 LAB — AMNISURE RUPTURE OF MEMBRANE (ROM) NOT AT ARMC: Amnisure ROM: NEGATIVE

## 2019-06-03 LAB — POCT FERN TEST: POCT Fern Test: NEGATIVE

## 2019-06-03 NOTE — Discharge Instructions (Signed)
Fetal Movement Counts Patient Name: ________________________________________________ Patient Due Date: ____________________ What is a fetal movement count?  A fetal movement count is the number of times that you feel your baby move during a certain amount of time. This may also be called a fetal kick count. A fetal movement count is recommended for every pregnant woman. You may be asked to start counting fetal movements as early as week 28 of your pregnancy. Pay attention to when your baby is most active. You may notice your baby's sleep and wake cycles. You may also notice things that make your baby move more. You should do a fetal movement count:  When your baby is normally most active.  At the same time each day. A good time to count movements is while you are resting, after having something to eat and drink. How do I count fetal movements? 1. Find a quiet, comfortable area. Sit, or lie down on your side. 2. Write down the date, the start time and stop time, and the number of movements that you felt between those two times. Take this information with you to your health care visits. 3. Write down your start time when you feel the first movement. 4. Count kicks, flutters, swishes, rolls, and jabs. You should feel at least 10 movements. 5. You may stop counting after you have felt 10 movements, or if you have been counting for 2 hours. Write down the stop time. 6. If you do not feel 10 movements in 2 hours, contact your health care provider for further instructions. Your health care provider may want to do additional tests to assess your baby's well-being. Contact a health care provider if:  You feel fewer than 10 movements in 2 hours.  Your baby is not moving like he or she usually does. Date: ____________ Start time: ____________ Stop time: ____________ Movements: ____________ Date: ____________ Start time: ____________ Stop time: ____________ Movements: ____________ Date: ____________  Start time: ____________ Stop time: ____________ Movements: ____________ Date: ____________ Start time: ____________ Stop time: ____________ Movements: ____________ Date: ____________ Start time: ____________ Stop time: ____________ Movements: ____________ Date: ____________ Start time: ____________ Stop time: ____________ Movements: ____________ Date: ____________ Start time: ____________ Stop time: ____________ Movements: ____________ Date: ____________ Start time: ____________ Stop time: ____________ Movements: ____________ Date: ____________ Start time: ____________ Stop time: ____________ Movements: ____________ This information is not intended to replace advice given to you by your health care provider. Make sure you discuss any questions you have with your health care provider. Document Revised: 11/09/2018 Document Reviewed: 11/09/2018 Elsevier Patient Education  2020 [employer]        Signs and Symptoms of Labor Labor is your body's natural process of moving your baby, placenta, and umbilical cord out of your uterus. The process of labor usually starts when your baby is full-term, between 37 and 40 weeks of pregnancy. How will I know when I am close to going into labor? As your body prepares for labor and the birth of your baby, you may notice the following symptoms in the weeks and days before true labor starts:  Having a strong desire to get your home ready to receive your new baby. This is called nesting. Nesting may be a sign that labor is approaching, and it may occur several weeks before birth. Nesting may involve cleaning and organizing your home.  Passing a small amount of thick, bloody mucus out of your vagina (normal bloody show or losing your mucus plug). This may happen more than a   week before labor begins, or it might occur right before labor begins as the opening of the cervix starts to widen (dilate). For some women, the entire mucus plug passes at once. For others,  smaller portions of the mucus plug may gradually pass over several days.  Your baby moving (dropping) lower in your pelvis to get into position for birth (lightening). When this happens, you may feel more pressure on your bladder and pelvic bone and less pressure on your ribs. This may make it easier to breathe. It may also cause you to need to urinate more often and have problems with bowel movements.  Having "practice contractions" (Braxton Hicks contractions) that occur at irregular (unevenly spaced) intervals that are more than 10 minutes apart. This is also called false labor. False labor contractions are common after exercise or sexual activity, and they will stop if you change position, rest, or drink fluids. These contractions are usually mild and do not get stronger over time. They may feel like: ? A backache or back pain. ? Mild cramps, similar to menstrual cramps. ? Tightening or pressure in your abdomen. Other early symptoms that labor may be starting soon include:  Nausea or loss of appetite.  Diarrhea.  Having a sudden burst of energy, or feeling very tired.  Mood changes.  Having trouble sleeping. How will I know when labor has begun? Signs that true labor has begun may include:  Having contractions that come at regular (evenly spaced) intervals and increase in intensity. This may feel like more intense tightening or pressure in your abdomen that moves to your back. ? Contractions may also feel like rhythmic pain in your upper thighs or back that comes and goes at regular intervals. ? For first-time mothers, this change in intensity of contractions often occurs at a more gradual pace. ? Women who have given birth before may notice a more rapid progression of contraction changes.  Having a feeling of pressure in the vaginal area.  Your water breaking (rupture of membranes). This is when the sac of fluid that surrounds your baby breaks. When this happens, you will notice  fluid leaking from your vagina. This may be clear or blood-tinged. Labor usually starts within 24 hours of your water breaking, but it may take longer to begin. ? Some women notice this as a gush of fluid. ? Others notice that their underwear repeatedly becomes damp. Follow these instructions at home:   When labor starts, or if your water breaks, call your health care provider or nurse care line. Based on your situation, they will determine when you should go in for an exam.  When you are in early labor, you may be able to rest and manage symptoms at home. Some strategies to try at home include: ? Breathing and relaxation techniques. ? Taking a warm bath or shower. ? Listening to music. ? Using a heating pad on the lower back for pain. If you are directed to use heat:  Place a towel between your skin and the heat source.  Leave the heat on for 20-30 minutes.  Remove the heat if your skin turns bright red. This is especially important if you are unable to feel pain, heat, or cold. You may have a greater risk of getting burned. Get help right away if:  You have painful, regular contractions that are 5 minutes apart or less.  Labor starts before you are [redacted] weeks along in your pregnancy.  You have a fever.  You have   a headache that does not go away.  You have bright red blood coming from your vagina.  You do not feel your baby moving.  You have a sudden onset of: ? Severe headache with vision problems. ? Nausea, vomiting, or diarrhea. ? Chest pain or shortness of breath. These symptoms may be an emergency. If your health care provider recommends that you go to the hospital or birth center where you plan to deliver, do not drive yourself. Have someone else drive you, or call emergency services (911 in the [place] Summary  Labor is your body's natural process of moving your baby, placenta, and umbilical cord out of your uterus.  The process of labor usually starts when your baby is  full-term, between 37 and 40 weeks of pregnancy.  When labor starts, or if your water breaks, call your health care provider or nurse care line. Based on your situation, they will determine when you should go in for an exam. This information is not intended to replace advice given to you by your health care provider. Make sure you discuss any questions you have with your health care provider. Document Revised: 12/20/2016 Document Reviewed: 08/27/2016 Elsevier Patient Education  2020 Elsevier Inc.  

## 2019-06-03 NOTE — MAU Note (Signed)
Pt reports to MAU for ROM, denies VB +movement. Pt states she has been having a trickle of fluid since 5pm and when she got here she thinks she felt a gush.

## 2019-06-03 NOTE — MAU Provider Note (Signed)
First Provider Initiated Contact with Patient 06/03/19 2014       S: [name] is a 29 y.o. G2P1001 at [redacted]w[redacted]d  who presents to MAU today complaining of leaking of fluid since 5 pm. She denies vaginal bleeding. She denies contractions. She reports normal fetal movement.    O: BP 121/71   Pulse 87   Temp 98.2 F (36.8 C) (Oral)   Resp 18   LMP 09/07/2018   SpO2 97%  GENERAL: Well-developed, well-nourished female in no acute distress.  HEAD: Normocephalic, atraumatic.  CHEST: Normal effort of breathing, regular heart rate ABDOMEN: Soft, nontender, gravid PELVIC: Normal external female genitalia. Vagina is pink and rugated. Cervix with normal contour, no lesions. Normal discharge.  No pooling.   Cervical exam: deferred     Fetal Monitoring: Baseline: 140 Variability: moderate Accelerations: 15x15 Decelerations: none Contractions: irregular  Results for orders placed or performed during the hospital encounter of 06/03/19 (from the past 24 hour(s))  POCT fern test     Status: None   Collection Time: 06/03/19  8:35 PM  Result Value Ref Range   POCT Fern Test Negative = intact amniotic membranes   Amnisure rupture of membrane (rom)not at [facility]     Status: None   Collection Time: 06/03/19  8:57 PM  Result Value Ref Range   Amnisure ROM NEGATIVE      A: [facility] at [redacted]w[redacted]d  Membranes intact  P: Discharge home  Labor precautions & fetal movement form F/u with ob/gyn as scheduled  Judeth Horn, NP 06/03/2019 10:24 PM

## 2019-06-08 ENCOUNTER — Ambulatory Visit (INDEPENDENT_AMBULATORY_CARE_PROVIDER_SITE_OTHER): Payer: No Typology Code available for payment source | Admitting: Women's Health

## 2019-06-08 ENCOUNTER — Other Ambulatory Visit: Payer: Self-pay

## 2019-06-08 VITALS — BP 114/71 | HR 92 | Temp 97.2°F | Wt 167.0 lb

## 2019-06-08 DIAGNOSIS — O368390 Maternal care for abnormalities of the fetal heart rate or rhythm, unspecified trimester, not applicable or unspecified: Secondary | ICD-10-CM

## 2019-06-08 DIAGNOSIS — B951 Streptococcus, group B, as the cause of diseases classified elsewhere: Secondary | ICD-10-CM

## 2019-06-08 DIAGNOSIS — Z3A39 39 weeks gestation of pregnancy: Secondary | ICD-10-CM

## 2019-06-08 DIAGNOSIS — O403XX0 Polyhydramnios, third trimester, not applicable or unspecified: Secondary | ICD-10-CM

## 2019-06-08 DIAGNOSIS — O2341 Unspecified infection of urinary tract in pregnancy, first trimester: Secondary | ICD-10-CM

## 2019-06-08 DIAGNOSIS — O365930 Maternal care for other known or suspected poor fetal growth, third trimester, not applicable or unspecified: Secondary | ICD-10-CM

## 2019-06-08 DIAGNOSIS — Z3480 Encounter for supervision of other normal pregnancy, unspecified trimester: Secondary | ICD-10-CM

## 2019-06-08 NOTE — Patient Instructions (Signed)
Cervical Ripening: May try one or both Fetal Movement Counts Patient Name: ________________________________________________ Patient Due Date: ____________________ What is a fetal movement count?  A fetal movement count is the number of times that you feel your baby move during a certain amount of time. This may also be called a fetal kick count. A fetal movement count is recommended for every pregnant woman. You may be asked to start counting fetal movements as early as week 28 of your pregnancy. Pay attention to when your baby is most active. You may notice your baby's sleep and wake cycles. You may also notice things that make your baby move more. You should do a fetal movement count:  When your baby is normally most active.  At the same time each day. A good time to count movements is while you are resting, after having something to eat and drink. How do I count fetal movements? 1. Find a quiet, comfortable area. Sit, or lie down on your side. 2. Write down the date, the start time and stop time, and the number of movements that you felt between those two times. Take this information with you to your health care visits. 3. Write down your start time when you feel the first movement. 4. Count kicks, flutters, swishes, rolls, and jabs. You should feel at least 10 movements. 5. You may stop counting after you have felt 10 movements, or if you have been counting for 2 hours. Write down the stop time. 6. If you do not feel 10 movements in 2 hours, contact your health care provider for further instructions. Your health care provider may want to do additional tests to assess your baby's well-being. Contact a health care provider if:  You feel fewer than 10 movements in 2 hours.  Your baby is not moving like he or she usually does. Date: ____________ Start time: ____________ Stop time: ____________ Movements: ____________ Date: ____________ Start time: ____________ Stop time: ____________  Movements: ____________ Date: ____________ Start time: ____________ Stop time: ____________ Movements: ____________ Date: ____________ Start time: ____________ Stop time: ____________ Movements: ____________ Date: ____________ Start time: ____________ Stop time: ____________ Movements: ____________ Date: ____________ Start time: ____________ Stop time: ____________ Movements: ____________ Date: ____________ Start time: ____________ Stop time: ____________ Movements: ____________ Date: ____________ Start time: ____________ Stop time: ____________ Movements: ____________ Date: ____________ Start time: ____________ Stop time: ____________ Movements: ____________ This information is not intended to replace advice given to you by your health care provider. Make sure you discuss any questions you have with your health care provider. Document Revised: 11/09/2018 Document Reviewed: 11/09/2018 Elsevier Patient Education  [place] Leaf capsules:  two 300mg  or 400mg  tablets with each meal, 2-3 times a day  Potential Side Effects Of Raspberry Leaf:  Most women do not experience any side effects from drinking raspberry leaf tea. However, nausea and loose stools are possible     Evening Primrose Oil capsules: may take 1 to 3 capsules daily. May also prick one to release the oil and insert it into your vagina at night.  Some of the potential side effects:  Upset stomach  Loose stools or diarrhea  Headaches  Nausea:     Maternity Assessment Unit (MAU)  The Maternity Assessment Unit (MAU) is located at the [facility] at [facility]. The address is: [address], [place], [place], [place] [address]. Please see map below for additional directions.    The Maternity Assessment Unit is designed  to help you during your pregnancy, and for up to 6 weeks after delivery, with any pregnancy- or postpartum-related emergencies, if you think you are in  labor, or if your water has broken. For example, if you experience nausea and vomiting, vaginal bleeding, severe abdominal or pelvic pain, elevated blood pressure or other problems related to your pregnancy or postpartum time, please come to the Maternity Assessment Unit for assistance.   Signs and Symptoms of Labor Labor is your body's natural process of moving your baby, placenta, and umbilical cord out of your uterus. The process of labor usually starts when your baby is full-term, between 31 and 40 weeks of pregnancy. How will I know when I am close to going into labor? As your body prepares for labor and the birth of your baby, you may notice the following symptoms in the weeks and days before true labor starts:  Having a strong desire to get your home ready to receive your new baby. This is called nesting. Nesting may be a sign that labor is approaching, and it may occur several weeks before birth. Nesting may involve cleaning and organizing your home.  Passing a small amount of thick, bloody mucus out of your vagina (normal bloody show or losing your mucus plug). This may happen more than a week before labor begins, or it might occur right before labor begins as the opening of the cervix starts to widen (dilate). For some women, the entire mucus plug passes at once. For others, smaller portions of the mucus plug may gradually pass over several days.  Your baby moving (dropping) lower in your pelvis to get into position for birth (lightening). When this happens, you may feel more pressure on your bladder and pelvic bone and less pressure on your ribs. This may make it easier to breathe. It may also cause you to need to urinate more often and have problems with bowel movements.  Having "practice contractions" (Braxton Hicks contractions) that occur at irregular (unevenly spaced) intervals that are more than 10 minutes apart. This is also called false labor. False labor contractions are common  after exercise or sexual activity, and they will stop if you change position, rest, or drink fluids. These contractions are usually mild and do not get stronger over time. They may feel like: ? A backache or back pain. ? Mild cramps, similar to menstrual cramps. ? Tightening or pressure in your abdomen. Other early symptoms that labor may be starting soon include:  Nausea or loss of appetite.  Diarrhea.  Having a sudden burst of energy, or feeling very tired.  Mood changes.  Having trouble sleeping. How will I know when labor has begun? Signs that true labor has begun may include:  Having contractions that come at regular (evenly spaced) intervals and increase in intensity. This may feel like more intense tightening or pressure in your abdomen that moves to your back. ? Contractions may also feel like rhythmic pain in your upper thighs or back that comes and goes at regular intervals. ? For first-time mothers, this change in intensity of contractions often occurs at a more gradual pace. ? Women who have given birth before may notice a more rapid progression of contraction changes.  Having a feeling of pressure in the vaginal area.  Your water breaking (rupture of membranes). This is when the sac of fluid that surrounds your baby breaks. When this happens, you will notice fluid leaking from your vagina. This may be clear or blood-tinged. Labor  usually starts within 24 hours of your water breaking, but it may take longer to begin. ? Some women notice this as a gush of fluid. ? Others notice that their underwear repeatedly becomes damp. Follow these instructions at home:   When labor starts, or if your water breaks, call your health care provider or nurse care line. Based on your situation, they will determine when you should go in for an exam.  When you are in early labor, you may be able to rest and manage symptoms at home. Some strategies to try at home include: ? Breathing and  relaxation techniques. ? Taking a warm bath or shower. ? Listening to music. ? Using a heating pad on the lower back for pain. If you are directed to use heat:  Place a towel between your skin and the heat source.  Leave the heat on for 20-30 minutes.  Remove the heat if your skin turns bright red. This is especially important if you are unable to feel pain, heat, or cold. You may have a greater risk of getting burned. Get help right away if:  You have painful, regular contractions that are 5 minutes apart or less.  Labor starts before you are [redacted] weeks along in your pregnancy.  You have a fever.  You have a headache that does not go away.  You have bright red blood coming from your vagina.  You do not feel your baby moving.  You have a sudden onset of: ? Severe headache with vision problems. ? Nausea, vomiting, or diarrhea. ? Chest pain or shortness of breath. These symptoms may be an emergency. If your health care provider recommends that you go to the hospital or birth center where you plan to deliver, do not drive yourself. Have someone else drive you, or call emergency services (911 in the [place] Summary  Labor is your body's natural process of moving your baby, placenta, and umbilical cord out of your uterus.  The process of labor usually starts when your baby is full-term, between 18 and 40 weeks of pregnancy.  When labor starts, or if your water breaks, call your health care provider or nurse care line. Based on your situation, they will determine when you should go in for an exam. This information is not intended to replace advice given to you by your health care provider. Make sure you discuss any questions you have with your health care provider. Document Revised: 12/20/2016 Document Reviewed: 08/27/2016 Elsevier Patient Education  2020 [employer].

## 2019-06-08 NOTE — Progress Notes (Signed)
Subjective:  Jordan [name] is a 29 y.o. G2P1001 at [redacted]w[redacted]d being seen today for ongoing prenatal care.  She is currently monitored for the following issues for this low-risk pregnancy and has Ophthalmoplegic migraine, not intractable; B12 deficiency anemia; Vitamin D deficiency; Supervision of other normal pregnancy, antepartum; Short interval between pregnancies affecting pregnancy in first trimester, antepartum; GBS (group b Streptococcus) UTI complicating pregnancy, first trimester; IUGR (intrauterine growth restriction) affecting care of mother; Fetal arrhythmia affecting pregnancy, antepartum; and Polyhydramnios on their problem list.  Patient reports no complaints.  Contractions: Not present. Vag. Bleeding: None.  Movement: Present. Denies leaking of fluid.   The following portions of the patient's history were reviewed and updated as appropriate: allergies, current medications, past family history, past medical history, past social history, past surgical history and problem list. Problem list updated.  Objective:   Vitals:   06/08/19 0808  BP: 114/71  Pulse: 92  Temp: (!) 97.2 F (36.2 C)  Weight: 167 lb (75.8 kg)    Fetal Status: Fetal Heart Rate (bpm): 147 Fundal Height: 40 cm Movement: Present  Presentation: Vertex  General:  Alert, oriented and cooperative. Patient is in no acute distress.  Skin: Skin is warm and dry. No rash noted.   Cardiovascular: Normal heart rate noted  Respiratory: Normal respiratory effort, no problems with respiration noted  Abdomen: Soft, gravid, appropriate for gestational age. Pain/Pressure: Present     Pelvic: Vag. Bleeding: None Vag D/C Character: Mucous   Cervical exam performed Dilation: 3 Effacement (%): 50    Extremities: Normal range of motion.  Edema: Trace  Mental Status: Normal mood and affect. Normal behavior. Normal judgment and thought content.   Urinalysis:      Assessment and Plan:  Pregnancy: G2P1001 at [redacted]w[redacted]d  1. Supervision of  other normal pregnancy, antepartum -discussed home stimulation of labor, pt to discuss membrane stripping at next visit -IOL scheduled for 41weeks 06/21/2019 -NST scheduled 06/14/2019, 06/18/2019  2. Polyhydramnios in third trimester complication, single or unspecified fetus -resolved, AFI 18.38 on scan 06/01/2019, no additional scans scheduled per MFM  3. Fetal arrhythmia affecting pregnancy, antepartum -fetal echo 03/20/2019 found aneurysmal PFO -repeat echo 04/19/2018, noted redundant FO seen billowing into left atrium, patent ductus arteriosus, trivial tricuspid valve regurgitation  -needs postnatal fetal ECG to confirm heart rate   4. Poor fetal growth affecting management of mother in third trimester, single or unspecified fetus -normal fetal growth on [place] 06/01/2019, EFW 21%, no further scans per MFM  5. GBS (group b Streptococcus) UTI complicating pregnancy, first trimester -will treat in labor, pt aware  Term labor symptoms and general obstetric precautions including but not limited to vaginal bleeding, contractions, leaking of fluid and fetal movement were reviewed in detail with the patient. I discussed the assessment and treatment plan with the patient. The patient was provided an opportunity to ask questions and all were answered. The patient agreed with the plan and demonstrated an understanding of the instructions. The patient was advised to call back or seek an in-person office evaluation/go to MAU at [facility] for any urgent or concerning symptoms. Please refer to After Visit Summary for other counseling recommendations.  Return in 6 days (on 06/14/2019) for in-person ROB/NST.   Erlene Devita, Odie Sera, NP

## 2019-06-10 ENCOUNTER — Inpatient Hospital Stay (HOSPITAL_COMMUNITY)
Admission: AD | Admit: 2019-06-10 | Discharge: 2019-06-12 | DRG: 807 | Disposition: A | Discharge status: Home / Self Care | Payer: No Typology Code available for payment source | Attending: Obstetrics & Gynecology | Admitting: Obstetrics & Gynecology

## 2019-06-10 ENCOUNTER — Other Ambulatory Visit: Payer: Self-pay

## 2019-06-10 ENCOUNTER — Encounter (HOSPITAL_COMMUNITY): Payer: Self-pay | Admitting: Obstetrics & Gynecology

## 2019-06-10 DIAGNOSIS — O368390 Maternal care for abnormalities of the fetal heart rate or rhythm, unspecified trimester, not applicable or unspecified: Secondary | ICD-10-CM | POA: Diagnosis present

## 2019-06-10 DIAGNOSIS — Z3A39 39 weeks gestation of pregnancy: Secondary | ICD-10-CM | POA: Diagnosis not present

## 2019-06-10 DIAGNOSIS — Z20822 Contact with and (suspected) exposure to covid-19: Secondary | ICD-10-CM | POA: Diagnosis present

## 2019-06-10 DIAGNOSIS — O26893 Other specified pregnancy related conditions, third trimester: Secondary | ICD-10-CM | POA: Diagnosis present

## 2019-06-10 DIAGNOSIS — O365930 Maternal care for other known or suspected poor fetal growth, third trimester, not applicable or unspecified: Secondary | ICD-10-CM | POA: Diagnosis present

## 2019-06-10 DIAGNOSIS — O409XX0 Polyhydramnios, unspecified trimester, not applicable or unspecified: Secondary | ICD-10-CM | POA: Diagnosis present

## 2019-06-10 DIAGNOSIS — O09891 Supervision of other high risk pregnancies, first trimester: Secondary | ICD-10-CM

## 2019-06-10 DIAGNOSIS — O99824 Streptococcus B carrier state complicating childbirth: Secondary | ICD-10-CM | POA: Diagnosis present

## 2019-06-10 DIAGNOSIS — O365990 Maternal care for other known or suspected poor fetal growth, unspecified trimester, not applicable or unspecified: Secondary | ICD-10-CM | POA: Diagnosis present

## 2019-06-10 DIAGNOSIS — Z3480 Encounter for supervision of other normal pregnancy, unspecified trimester: Secondary | ICD-10-CM

## 2019-06-10 DIAGNOSIS — B951 Streptococcus, group B, as the cause of diseases classified elsewhere: Secondary | ICD-10-CM | POA: Diagnosis present

## 2019-06-10 LAB — CBC
HCT: 32.8 % — ABNORMAL LOW (ref 36.0–46.0)
Hemoglobin: 10.9 g/dL — ABNORMAL LOW (ref 12.0–15.0)
MCH: 32.3 pg (ref 26.0–34.0)
MCHC: 33.2 g/dL (ref 30.0–36.0)
MCV: 97.3 fL (ref 80.0–100.0)
Platelets: 166 10*3/uL (ref 150–400)
RBC: 3.37 MIL/uL — ABNORMAL LOW (ref 3.87–5.11)
RDW: 13 % (ref 11.5–15.5)
WBC: 11.5 10*3/uL — ABNORMAL HIGH (ref 4.0–10.5)
nRBC: 0 % (ref 0.0–0.2)

## 2019-06-10 LAB — TYPE AND SCREEN
ABO/RH(D): O POS
Antibody Screen: NEGATIVE

## 2019-06-10 LAB — RESPIRATORY PANEL BY RT PCR (FLU A&B, COVID)
Influenza A by PCR: NEGATIVE
Influenza B by PCR: NEGATIVE
SARS Coronavirus 2 by RT PCR: NEGATIVE

## 2019-06-10 LAB — POCT FERN TEST: POCT Fern Test: POSITIVE

## 2019-06-10 MED ORDER — LIDOCAINE HCL (PF) 1 % IJ SOLN: 30.0000 mL | INTRAMUSCULAR | Status: DC | PRN

## 2019-06-10 MED ORDER — FENTANYL CITRATE (PF) 100 MCG/2ML IJ SOLN: 50.0000 ug | INTRAMUSCULAR | Status: DC | PRN

## 2019-06-10 MED ORDER — ACETAMINOPHEN 325 MG PO TABS: 650.0000 mg | ORAL_TABLET | ORAL | Status: DC | PRN

## 2019-06-10 MED ORDER — LACTATED RINGERS IV SOLN: 500.0000 mL | INTRAVENOUS | Status: DC | PRN

## 2019-06-10 MED ORDER — SODIUM CHLORIDE 0.9 % IV SOLN
5.0000 10*6.[IU] | Freq: Once | INTRAVENOUS | Status: AC
  Administered 2019-06-10: 5 10*6.[IU] via INTRAVENOUS
  Filled 2019-06-10: qty 5

## 2019-06-10 MED ORDER — PENICILLIN G POT IN DEXTROSE 60000 UNIT/ML IV SOLN
3.0000 10*6.[IU] | INTRAVENOUS | Status: DC
  Administered 2019-06-11: 3 10*6.[IU] via INTRAVENOUS
  Filled 2019-06-10 (×3): qty 50

## 2019-06-10 MED ORDER — SOD CITRATE-CITRIC ACID 500-334 MG/5ML PO SOLN: 30.0000 mL | ORAL | Status: DC | PRN

## 2019-06-10 MED ORDER — ONDANSETRON HCL 4 MG/2ML IJ SOLN
4.0000 mg | Freq: Four times a day (QID) | INTRAMUSCULAR | Status: DC | PRN
  Administered 2019-06-11: 4 mg via INTRAVENOUS
  Filled 2019-06-10: qty 2

## 2019-06-10 MED ORDER — OXYTOCIN 40 UNITS IN NORMAL SALINE INFUSION - SIMPLE MED
2.5000 [IU]/h | INTRAVENOUS | Status: DC
  Filled 2019-06-10: qty 1000

## 2019-06-10 MED ORDER — OXYTOCIN BOLUS FROM INFUSION
500.0000 mL | Freq: Once | INTRAVENOUS | Status: AC
  Administered 2019-06-11: 500 mL via INTRAVENOUS

## 2019-06-10 MED ORDER — LACTATED RINGERS IV SOLN: INTRAVENOUS | Status: DC

## 2019-06-10 NOTE — MAU Note (Signed)
Patient presents with report of SROM at 8pm, clear fluid, no vaginal bleeding, +FM, ctx's Q 5 min.

## 2019-06-10 NOTE — H&P (Signed)
LABOR AND DELIVERY ADMISSION HISTORY AND PHYSICAL NOTE  Jordan [name] is a 29 y.o. female G2P1001 with IUP at [redacted]w[redacted]d by LMP c/w 19wk [place] presenting for SROM.   Reports she had SROM around 2000 with clear fluid. Has had contractions since then.   She reports positive fetal movement.   She plans on breast feeding. Her contraception plan is: unsure.  Prenatal History/Complications: PNC at [facility] [place]:  @[redacted]w[redacted]d , CWD, normal anatomy, cephalic presentation, posterior placenta, 21%ile, EFW 2932 g  Pregnancy complications:  - GBS bacteriuria - resolved IUGR - c/f aneurysmal PFO>redundant PFO tissue on repeat, suggest post-natal ECG for infant - resolved poly  Past Medical History: Past Medical History:  Diagnosis Date  . Migraines   . PROM (premature rupture of membranes) 03/13/2018  . Supervision of normal first pregnancy, antepartum 08/08/2017    Nursing Staff Provider Office Location  [facility] Midwife preferred. No students/residents. Dating   LMP and U/S Language  English Anatomy [facility]   normal female, anterior placenta Flu Vaccine  10/19 [employer] Employee Genetic Screen  NIPS: negative  AFP: neg TDaP vaccine   Given 10/8 Hgb A1C or  GTT Early  Third trimester 2 hour GTT wnl Rhogam   NA   LAB RESULTS  Feeding Plan  Breast Blood Type O/Positive/-    Past Surgical History: Past Surgical History:  Procedure Laterality Date  . WISDOM TOOTH EXTRACTION  May 2012    Obstetrical History: OB History    Gravida  2   Para  1   Term  1   Preterm  0   AB  0   Living  1     SAB  0   TAB  0   Ectopic  0   Multiple  0   Live Births  1           Social History: Social History   Socioeconomic History  . Marital status: Married    Spouse name: June 2012  . Number of children: 0  . Years of education: BSN  . Highest education level: Not on file  Occupational History  . 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 Smoking status: Never Smoker  . Smokeless tobacco: Never Used   Substance and Sexual Activity  . Alcohol use: Not Currently  . Drug use: No  . Sexual activity: Yes    Birth control/protection: None  Other Topics Concern  . Not on file  Social History Narrative   Nurse at [employer]   Lives with roommate   Caffeine use: 3-4 times per week [employer])   Right handed   Social Determinants of Health   Financial Resource Strain:   . Difficulty of Paying Living Expenses: Not on file  Food Insecurity:   . Worried About Neita Carp in the Last Year: Not on file  . Ran Out of Food in the Last Year: Not on file  Transportation Needs:   . Lack of Transportation (Medical): Not on file  . Lack of Transportation (Non-Medical): Not on file  Physical Activity:   . Days of Exercise per Week: Not on file  . Minutes of Exercise per Session: Not on file  Stress:   . Feeling of Stress : Not on file  Social Connections:   . Frequency of Communication with Friends and Family: Not on file  . Frequency of Social Gatherings with Friends and Family: Not on file  . Attends Religious Services: Not on file  . Active Member  of Clubs or Organizations: Not on file  . Attends Banker Meetings: Not on file  . Marital Status: Not on file    Family History: Family History  Problem Relation Age of Onset  . Hypertension Father   . Hyperlipidemia Father   . Cancer Maternal Grandmother   . Cancer Maternal Grandfather   . Cancer Paternal Grandmother     Allergies: Allergies  Allergen Reactions  . Latex Swelling and Rash    Medications Prior to Admission  Medication Sig Dispense Refill Last Dose  . acetaminophen (TYLENOL) 500 MG tablet Take 500 mg by mouth every 8 (eight) hours as needed for mild pain or headache.   Past Week at Unknown time  . calcium carbonate (TUMS) 500 MG chewable tablet Chew 2 tablets by mouth 3 (three) times daily as needed for indigestion or heartburn.    06/09/2019 at Unknown time  . famotidine (PEPCID) 20 MG tablet     06/09/2019 at Unknown time  . Prenatal Vit-Fe Fumarate-FA (PRENATAL PO) Take 1 Dose by mouth daily.   06/10/2019 at Unknown time     Review of Systems  All systems reviewed and negative except as stated in HPI  Physical Exam Blood pressure 124/82, pulse 94, temperature 98.1 F (36.7 C), temperature source Oral, resp. rate 18, last menstrual period 09/07/2018, SpO2 100 %, unknown if currently breastfeeding. General appearance: alert, oriented, NAD Lungs: normal respiratory effort Heart: regular rate Abdomen: soft, non-tender; gravid, leopolds 2800 g Extremities: No calf swelling or tenderness Presentation: cephalic by RN SVE  Fetal monitoringBaseline: 145 bpm, Variability: Good {> 6 bpm), Accelerations: Reactive and Decelerations: Absent Uterine activityFrequency: Every 2-4 minutes  Dilation: 3.5 Effacement (%): 60 Station: -2 Exam by:: Haskel Schroeder RN  Prenatal labs: ABO, Rh: O/RH(D) POSITIVE/-- (08/18 0933) Antibody: NO ANTIBODIES DETECTED (08/18 0933) Rubella: 1.22 (08/18 0933) RPR: NON-REACTIVE (01/08 0853)  HBsAg: NON-REACTIVE (08/18 0933)  HIV: NON-REACTIVE (01/08 0853)  GC/Chlamydia: neg/neg 05/18/2019  GBS:    2-hr GTT: normal 04/13/2019 (74,101,75) Genetic screening:  Low risk Panorama Anatomy US: IUGR and poly>both resolved, femur lengths cont to be short but likely constitutional  Prenatal Transfer Tool  Maternal Diabetes: No Genetic Screening: Normal Maternal Ultrasounds/Referrals: Normal Fetal Ultrasounds or other Referrals:  Fetal echo Maternal Substance Abuse:  No Significant Maternal Medications:  None Significant Maternal Lab Results: Group B Strep positive  Results for orders placed or performed during the hospital encounter of 06/10/19 (from the past 24 hour(s))  POCT fern test   Collection Time: 06/10/19  9:52 PM  Result Value Ref Range   POCT Fern Test Positive = ruptured amniotic membanes     Patient Active Problem List   Diagnosis Date Noted  .  Normal labor 06/10/2019  . IUGR (intrauterine growth restriction) affecting care of mother 03/22/2019  . Fetal arrhythmia affecting pregnancy, antepartum 03/22/2019  . Polyhydramnios 03/22/2019  . GBS (group b Streptococcus) UTI complicating pregnancy, first trimester 11/30/2018  . Short interval between pregnancies affecting pregnancy in first trimester, antepartum 11/21/2018  . Supervision of other normal pregnancy, antepartum 11/13/2018  . Ophthalmoplegic migraine, not intractable 10/08/2016  . B12 deficiency anemia 11/12/2011  . Vitamin D deficiency 11/12/2011    Assessment: STEFHANIE KACHMAR is a 29 y.o. G2P1001 at [redacted]w[redacted]d here for SROM and SOL.  #Labor: Expectant management until completes first dose of GBS treatment, then augment PRN though is already starting to contract #Pain: IV pain meds PRN, epidural upon request #FWB: Cat I #GBS/ID: Positive, penicillin  ordered #COVID: swab pending #MOF: Breast #MOC: undecided #Circ: Yes, inpatient ([facility])  #Suspected fetal arrythmia: fetal echo showing only redundant PFO tissue, Peds Cards recommends ECG after delivery to confirm rhythm  Clarnce Flock 06/10/2019, 10:21 PM

## 2019-06-11 ENCOUNTER — Inpatient Hospital Stay (HOSPITAL_COMMUNITY): Payer: No Typology Code available for payment source | Admitting: Anesthesiology

## 2019-06-11 ENCOUNTER — Encounter (HOSPITAL_COMMUNITY): Payer: Self-pay | Admitting: Obstetrics & Gynecology

## 2019-06-11 DIAGNOSIS — Z3A39 39 weeks gestation of pregnancy: Secondary | ICD-10-CM

## 2019-06-11 LAB — RPR: RPR Ser Ql: NONREACTIVE

## 2019-06-11 LAB — ABO/RH: ABO/RH(D): O POS

## 2019-06-11 MED ORDER — ACETAMINOPHEN 325 MG PO TABS: 650.0000 mg | ORAL_TABLET | ORAL | Status: DC | PRN

## 2019-06-11 MED ORDER — SODIUM CHLORIDE (PF) 0.9 % IJ SOLN
INTRAMUSCULAR | Status: DC | PRN
  Administered 2019-06-11: 12 mL/h via EPIDURAL

## 2019-06-11 MED ORDER — EPHEDRINE 5 MG/ML INJ: 10.0000 mg | INTRAVENOUS | Status: DC | PRN

## 2019-06-11 MED ORDER — WITCH HAZEL-GLYCERIN EX PADS: 1.0000 "application " | MEDICATED_PAD | CUTANEOUS | Status: DC | PRN

## 2019-06-11 MED ORDER — LACTATED RINGERS IV SOLN
500.0000 mL | Freq: Once | INTRAVENOUS | Status: AC
  Administered 2019-06-11: 500 mL via INTRAVENOUS

## 2019-06-11 MED ORDER — SIMETHICONE 80 MG PO CHEW: 80.0000 mg | CHEWABLE_TABLET | ORAL | Status: DC | PRN

## 2019-06-11 MED ORDER — PRENATAL MULTIVITAMIN CH
1.0000 | ORAL_TABLET | Freq: Every day | ORAL | Status: DC
  Administered 2019-06-11 – 2019-06-12 (×2): 1 via ORAL
  Filled 2019-06-11 (×2): qty 1

## 2019-06-11 MED ORDER — PHENYLEPHRINE 40 MCG/ML (10ML) SYRINGE FOR IV PUSH (FOR BLOOD PRESSURE SUPPORT)
80.0000 ug | PREFILLED_SYRINGE | INTRAVENOUS | Status: DC | PRN
  Filled 2019-06-11: qty 10

## 2019-06-11 MED ORDER — IBUPROFEN 600 MG PO TABS
600.0000 mg | ORAL_TABLET | Freq: Four times a day (QID) | ORAL | Status: DC
  Administered 2019-06-11 – 2019-06-12 (×5): 600 mg via ORAL
  Filled 2019-06-11 (×5): qty 1

## 2019-06-11 MED ORDER — DIPHENHYDRAMINE HCL 50 MG/ML IJ SOLN: 12.5000 mg | INTRAMUSCULAR | Status: DC | PRN

## 2019-06-11 MED ORDER — ONDANSETRON HCL 4 MG PO TABS: 4.0000 mg | ORAL_TABLET | ORAL | Status: DC | PRN

## 2019-06-11 MED ORDER — LIDOCAINE HCL (PF) 1 % IJ SOLN
INTRAMUSCULAR | Status: DC | PRN
  Administered 2019-06-11 (×2): 5 mL via EPIDURAL

## 2019-06-11 MED ORDER — COCONUT OIL OIL
1.0000 "application " | TOPICAL_OIL | Status: DC | PRN
  Administered 2019-06-12: 1 via TOPICAL

## 2019-06-11 MED ORDER — ONDANSETRON HCL 4 MG/2ML IJ SOLN: 4.0000 mg | INTRAMUSCULAR | Status: DC | PRN

## 2019-06-11 MED ORDER — DIBUCAINE (PERIANAL) 1 % EX OINT: 1.0000 "application " | TOPICAL_OINTMENT | CUTANEOUS | Status: DC | PRN

## 2019-06-11 MED ORDER — SENNOSIDES-DOCUSATE SODIUM 8.6-50 MG PO TABS
2.0000 | ORAL_TABLET | ORAL | Status: DC
  Administered 2019-06-12: 2 via ORAL
  Filled 2019-06-11: qty 2

## 2019-06-11 MED ORDER — OXYCODONE HCL 5 MG PO TABS: 10.0000 mg | ORAL_TABLET | ORAL | Status: DC | PRN

## 2019-06-11 MED ORDER — MAGNESIUM HYDROXIDE 400 MG/5ML PO SUSP: 30.0000 mL | ORAL | Status: DC | PRN

## 2019-06-11 MED ORDER — DIPHENHYDRAMINE HCL 25 MG PO CAPS: 25.0000 mg | ORAL_CAPSULE | Freq: Four times a day (QID) | ORAL | Status: DC | PRN

## 2019-06-11 MED ORDER — OXYCODONE HCL 5 MG PO TABS: 5.0000 mg | ORAL_TABLET | ORAL | Status: DC | PRN

## 2019-06-11 MED ORDER — FENTANYL-BUPIVACAINE-NACL 0.5-0.125-0.9 MG/250ML-% EP SOLN: 12.0000 mL/h | EPIDURAL | Status: DC | PRN

## 2019-06-11 MED ORDER — PHENYLEPHRINE 40 MCG/ML (10ML) SYRINGE FOR IV PUSH (FOR BLOOD PRESSURE SUPPORT): 80.0000 ug | PREFILLED_SYRINGE | INTRAVENOUS | Status: DC | PRN

## 2019-06-11 MED ORDER — BENZOCAINE-MENTHOL 20-0.5 % EX AERO: 1.0000 "application " | INHALATION_SPRAY | CUTANEOUS | Status: DC | PRN

## 2019-06-11 MED ORDER — FENTANYL-BUPIVACAINE-NACL 0.5-0.125-0.9 MG/250ML-% EP SOLN
EPIDURAL | Status: AC
  Filled 2019-06-11: qty 250

## 2019-06-11 NOTE — Lactation Note (Signed)
This note was copied from a baby's chart. Lactation Consultation Note  Patient Name: Jordan [name] [id number]'[id number] Date: 06/11/2019 Reason for consult: Initial assessment  Baby is 7 hours old / exp breast feeder.  As LC entered the room baby asleep in the crib.  Per mom the baby latched for 10 mins and has not fed since.  LC reviewed hand express verbally and mom able to independently  Hand express 1 ml. LC assisted to spoon feed and baby spit up alittle afterwards and  Went off to sleep.  Mom placed baby STS on her chest.  LC recommended calling for LC with feeding cues for feeding assessment.  Due to areola edema LC recommended: Shells between feedings except when sleeping.  Prior to latching - breast massage , hand express, prepump to stretch the nipple / areola complex .  Reverse pressure as shown and firm support for a deeper latch.  LC reviewed the benefits of STS feedings.  LC reviewed the [facility] resources after D/C and mom has the pamphlet.  Per mom has DEBP at home.     Maternal Data Has patient been taught Hand Expression?: Yes Does the patient have breastfeeding experience prior to this delivery?: Yes  Feeding Feeding Type: Breast Milk  LATCH Score Latch: (baby to sleepy to latch)                 Interventions Interventions: Breast feeding basics reviewed;Skin to skin;Breast massage;Hand express;Expressed milk;Shells;Hand pump  Lactation Tools Discussed/Used Pump Review: Setup, frequency, and cleaning Initiated by:: MAI Date initiated:: 06/11/19   Consult Status Consult Status: Follow-up Date: 06/11/19 Follow-up type: In-patient    Matilde Sprang Idy Rawling 06/11/2019, 12:31 PM

## 2019-06-11 NOTE — Anesthesia Preprocedure Evaluation (Signed)
Anesthesia Evaluation  Patient identified by MRN, date of birth, ID band Patient awake    Reviewed: Allergy & Precautions, NPO status , Patient's Chart, lab work & pertinent test results  Airway Mallampati: II  TM Distance: >3 FB Neck ROM: Full    Dental no notable dental hx. (+) Dental Advisory Given   Pulmonary neg pulmonary ROS,    Pulmonary exam normal        Cardiovascular negative cardio ROS Normal cardiovascular exam     Neuro/Psych negative neurological ROS  negative psych ROS   GI/Hepatic negative GI ROS, Neg liver ROS,   Endo/Other  negative endocrine ROS  Renal/GU negative Renal ROS  negative genitourinary   Musculoskeletal negative musculoskeletal ROS (+)   Abdominal   Peds negative pediatric ROS (+)  Hematology negative hematology ROS (+)   Anesthesia Other Findings   Reproductive/Obstetrics (+) Pregnancy                             Anesthesia Physical Anesthesia Plan  ASA: II  Anesthesia Plan: Epidural   Post-op Pain Management:    Induction:   PONV Risk Score and Plan:   Airway Management Planned: Natural Airway  Additional Equipment:   Intra-op Plan:   Post-operative Plan:   Informed Consent: I have reviewed the patients History and Physical, chart, labs and discussed the procedure including the risks, benefits and alternatives for the proposed anesthesia with the patient or authorized representative who has indicated his/her understanding and acceptance.     Dental advisory given  Plan Discussed with: Anesthesiologist  Anesthesia Plan Comments:         Anesthesia Quick Evaluation  

## 2019-06-11 NOTE — Discharge Summary (Signed)
Postpartum Discharge Summary    Patient Name: Jordan [name] DOB: [birth year] MRN: [id number]  Date of admission: 06/10/2019 Delivering Provider: Clarnce Flock   Date of discharge: 06/12/2019  Admitting diagnosis: Normal labor [O80, Z37.9] Intrauterine pregnancy: [redacted]w[redacted]d    Secondary diagnosis:  Active Problems:   Supervision of other normal pregnancy, antepartum   Short interval between pregnancies affecting pregnancy in first trimester, antepartum   GBS (group b Streptococcus) UTI complicating pregnancy, first trimester   IUGR (intrauterine growth restriction) affecting care of mother   Fetal arrhythmia affecting pregnancy, antepartum   Polyhydramnios   Normal labor  Additional problems:      Discharge diagnosis: Term Pregnancy Delivered                                                                                                Post partum procedures:none  Augmentation: None  Complications: None  Hospital course:  Onset of Labor With Vaginal Delivery     29y.o. yo G2P1001 at 380w4das admitted in Latent Labor on 06/10/2019. Patient had an uncomplicated labor course as follows: arrived after SROM for clear at 3cm, progressed spontaneously to fully dilated and had uncomplicated NSVD. Membrane Rupture Time/Date: 8:00 PM ,06/10/2019   Intrapartum Procedures: Episiotomy: None [1]                                         Lacerations:  None [1]  Patient had a delivery of a Viable infant. 06/11/2019  Information for the patient's newborn:  Jordan [name][[id number]]Delivery Method: Vag-Spont     Pateint had an uncomplicated postpartum course. She declined contraception prior to discharge. She is ambulating, tolerating a regular diet, passing flatus, and urinating well. Patient is discharged home in stable condition on 06/12/19.  Delivery time: 5:09 AM    Magnesium Sulfate received: No BMZ received: No Rhophylac:N/A MMR:N/A Transfusion:No  Physical exam  Vitals:    06/11/19 0900 06/11/19 1245 06/11/19 2100 06/12/19 0610  BP: 109/72 117/71 114/68 97/66  Pulse: 86 86 81 74  Resp: 18 16 16 18   Temp: 98.4 F (36.9 C) 98 F (36.7 C) 98 F (36.7 C) 98.2 F (36.8 C)  TempSrc: Oral Oral Oral Oral  SpO2: 100% 100% 100% 100%  Weight:      Height:       General: alert, cooperative and no distress Lochia: appropriate Uterine Fundus: firm Incision: N/A DVT Evaluation: No evidence of DVT seen on physical exam. Mild calf/ankle edema Labs: Lab Results  Component Value Date   WBC 11.5 (H) 06/10/2019   HGB 10.9 (L) 06/10/2019   HCT 32.8 (L) 06/10/2019   MCV 97.3 06/10/2019   PLT 166 06/10/2019   CMP Latest Ref Rng & Units 05/11/2017  Glucose 65 - 99 mg/dL 97  BUN 6 - 20 mg/dL 11  Creatinine 0.44 - 1.00 mg/dL 0.76  Sodium 135 - 145 mmol/L 137  Potassium 3.5 - 5.1 mmol/L 4.0  Chloride 101 - 111 mmol/L 102  CO2 22 - 32 mmol/L 26  Calcium 8.9 - 10.3 mg/dL 8.7(L)   Jordan [name] Score: Edinburgh Postnatal Depression Scale Screening Tool 06/11/2019  I have been able to laugh and see the funny side of things. (No Data)  I have looked forward with enjoyment to things. -  I have blamed myself unnecessarily when things went wrong. -  I have been anxious or worried for no good reason. -  I have felt scared or panicky for no good reason. -  Things have been getting on top of me. -  I have been so unhappy that I have had difficulty sleeping. -  I have felt sad or miserable. -  I have been so unhappy that I have been crying. -  The thought of harming myself has occurred to me. Jordan [name] -    Discharge instruction: per After Visit Summary and "Baby and Me Booklet".  After visit meds:  Allergies as of 06/12/2019      Reactions   Latex Swelling, Rash      Medication List    STOP taking these medications   Evening Primrose Oil 1000 MG Caps     TAKE these medications   acetaminophen 500 MG tablet Commonly known as:  TYLENOL Take 500 mg by mouth every 8 (eight) hours as needed for mild pain or headache.   ferrous sulfate 325 (65 FE) MG tablet Take 1 tablet (325 mg [name]) by mouth every other day.   ibuprofen 600 MG tablet Commonly known as: ADVIL Take 1 tablet (600 mg [name]) by mouth every 6 (six) hours.   Pepcid 20 MG tablet Generic drug: famotidine Take 20 mg by mouth 2 (two) times daily.   prenatal multivitamin Tabs tablet Take 1 tablet by mouth daily at 12 noon.   Tums 500 MG chewable tablet Generic drug: calcium carbonate Chew 2 tablets by mouth 3 (three) times daily as needed for indigestion or heartburn.       Diet: routine diet  Activity: Advance as tolerated. Pelvic rest for 6 weeks.   Outpatient follow up:6 weeks Follow up Appt: Future Appointments  Date Time Provider [place]  07/24/2019  1:20 PM Leftwich-Kirby, Kathie Dike, CNM [facility]  08/27/2019  8:30 AM Ward Givens, NP [facility] None   Follow up Visit:   Please schedule this patient for Postpartum visit in: 6 weeks with the following provider: Any provider Virtual For C/S patients schedule nurse incision check in weeks 2 weeks: no High risk pregnancy complicated by: IUGR resolved, ?fetal arrythmia Delivery mode:  SVD Anticipated Birth Control:  other/unsure PP Procedures needed: none  Schedule Integrated BH visit: no   Newborn Data: Live born female  Birth Weight:  3385 grams APGAR: 4, 9  Newborn Delivery   Birth date/time: 06/11/2019 05:09:00 Delivery type: Vaginal, Spontaneous      Baby Feeding: Breast Disposition:home with mother   06/12/2019 Clarnce Flock, MD

## 2019-06-11 NOTE — Anesthesia Postprocedure Evaluation (Signed)
Anesthesia Post Note  Patient: Jordan [name]  Procedure(s) Performed: AN AD HOC LABOR EPIDURAL     Patient location during evaluation: Mother Baby Anesthesia Type: Epidural Level of consciousness: awake and alert Pain management: pain level controlled Vital Signs Assessment: post-procedure vital signs reviewed and stable Respiratory status: spontaneous breathing, nonlabored ventilation and respiratory function stable Cardiovascular status: stable Postop Assessment: no headache, no backache and epidural receding Anesthetic complications: no    Last Vitals:  Vitals:   06/11/19 0745 06/11/19 0900  BP: 120/77 109/72  Pulse: 86 86  Resp: 18 18  Temp: 36.8 C 36.9 C  SpO2: 100% 100%    Last Pain:  Vitals:   06/11/19 0900  TempSrc: Oral  PainSc: 0-No pain   Pain Goal: Patients Stated Pain Goal: 0 (06/10/19 2145)                 Nelia Rogoff

## 2019-06-11 NOTE — Anesthesia Procedure Notes (Signed)
Epidural Patient location during procedure: OB Start time: 06/11/2019 1:06 AM End time: 06/11/2019 1:17 AM  Staffing Anesthesiologist: Heather Roberts, MD Performed: anesthesiologist   Preanesthetic Checklist Completed: patient identified, IV checked, site marked, risks and benefits discussed, monitors and equipment checked, pre-op evaluation and timeout performed  Epidural Patient position: sitting Prep: DuraPrep Patient monitoring: heart rate, cardiac monitor, continuous pulse ox and blood pressure Approach: midline Location: L2-L3 Injection technique: LOR saline  Needle:  Needle type: Tuohy  Needle gauge: 17 G Needle length: 9 cm Needle insertion depth: 6 cm Catheter size: 20 Guage Catheter at skin depth: 11 cm Test dose: negative and Other  Assessment Events: blood not aspirated, injection not painful, no injection resistance and negative IV test  Additional Notes Informed consent obtained prior to proceeding including risk of failure, 1% risk of PDPH, risk of minor discomfort and bruising.  Discussed rare but serious complications including epidural abscess, permanent nerve injury, epidural hematoma.  Discussed alternatives to epidural analgesia and patient desires to proceed.  Timeout performed pre-procedure verifying patient name, procedure, and platelet count.  Patient tolerated procedure well.

## 2019-06-11 NOTE — Progress Notes (Signed)
LABOR PROGRESS NOTE  Jordan [name] is a 29 y.o. G2P1001 at [redacted]w[redacted]d  admitted for SROM.  Subjective: Comfortable w epidural  Objective: BP 112/66   Pulse 85   Temp 98 F (36.7 C) (Oral)   Resp 20   Ht 4\' 10"  (1.473 m)   Wt 75.8 kg   LMP 09/07/2018   SpO2 99%   BMI 34.93 kg/m  or  Vitals:   06/11/19 0146 06/11/19 0151 06/11/19 0156 06/11/19 0201  BP: 106/64 (!) 100/59 108/63 112/66  Pulse: 98 89 87 85  Resp:      Temp:    98 F (36.7 C)  TempSrc:    Oral  SpO2:      Weight:      Height:         Dilation: 5 Effacement (%): 60, 70 Cervical Position: Middle Station: -1 Presentation: Vertex Exam by:: Meghan Rice, RN FHT: baseline rate 135, moderate varibility, +acel, -decel Toco: q3-4  Labs: Lab Results  Component Value Date   WBC 11.5 (H) 06/10/2019   HGB 10.9 (L) 06/10/2019   HCT 32.8 (L) 06/10/2019   MCV 97.3 06/10/2019   PLT 166 06/10/2019    Patient Active Problem List   Diagnosis Date Noted  . Normal labor 06/10/2019  . IUGR (intrauterine growth restriction) affecting care of mother 03/22/2019  . Fetal arrhythmia affecting pregnancy, antepartum 03/22/2019  . Polyhydramnios 03/22/2019  . GBS (group b Streptococcus) UTI complicating pregnancy, first trimester 11/30/2018  . Short interval between pregnancies affecting pregnancy in first trimester, antepartum 11/21/2018  . Supervision of other normal pregnancy, antepartum 11/13/2018  . Ophthalmoplegic migraine, not intractable 10/08/2016  . B12 deficiency anemia 11/12/2011  . Vitamin D deficiency 11/12/2011    Assessment / Plan: 29 y.o. G2P1001 at [redacted]w[redacted]d here for SROM.  Labor: progressing well, cont expectant management, pitocin augmentation PRN Fetal Wellbeing:  Cat I Pain Control:  epidural GBS: Positive, on penicillin Anticipated MOD:  SVD  [redacted]w[redacted]d, MD/MPH OB Fellow  06/11/2019, 2:47 AM

## 2019-06-12 MED ORDER — IBUPROFEN 600 MG PO TABS: 600.0000 mg | ORAL_TABLET | Freq: Four times a day (QID) | ORAL | 0 refills | Status: DC

## 2019-06-12 MED ORDER — FERROUS SULFATE 325 (65 FE) MG PO TABS
325.0000 mg | ORAL_TABLET | ORAL | Status: DC
  Administered 2019-06-12: 325 mg via ORAL
  Filled 2019-06-12: qty 1

## 2019-06-12 MED ORDER — FERROUS SULFATE 325 (65 FE) MG PO TABS: 325.0000 mg | ORAL_TABLET | ORAL | 1 refills | Status: DC

## 2019-06-12 MED FILL — IBUPROFEN 600 MG TABLET: 600 | 8 days supply | Qty: 30 | Fill #0

## 2019-06-12 NOTE — Lactation Note (Signed)
This note was copied from a baby's chart. Lactation Consultation Note  Patient Name: Jordan [name] [id number]'[id number] Date: 06/12/2019 Reason for consult: Follow-up assessment Baby is 27 hours old/5% weight loss.  Baby is currently latched well to breast and actively sucking with a few swallows noted.  Mom reports she is working a obtaining a deep latch.  Discussed milk coming to volume and the prevention and treatment of engorgement.  Mom asking questions about flange size.  Instructed to use a flange that allows nipple to freely move without rubbing.  Reviewed outpatient services and encouraged to call prn.  Maternal Data    Feeding Feeding Type: Breast Fed  LATCH Score                   Interventions    Lactation Tools Discussed/Used     Consult Status Consult Status: Complete Follow-up type: Call as needed    Huston Foley 06/12/2019, 8:48 AM

## 2019-06-12 NOTE — Progress Notes (Signed)
CSW received consult due to score 18 on Edinburgh Depression Screen.    CSW congratulated MOB and FOB on the birth of infant. CSW observed that MOB was sitting up in bed while infant was in crib. CSW advised MOB of CSW's role and the reason for the visit. MOB reported that she wasn't aware of the score that's he received on the Edinburgh. CSW advised MOB that it was 18 and that CSW's usually like for moms to score 9 or less. MOB was very understanding of this and reported that she was very fearful leading up to infants birth. MOB reported that she was very overwhelmed as infant had a lot going on,. MOB reported that she has been feeling fine since giving birth. MOB informed CSW that she dealt with PPD with her oldest daughter but reported that it may have ever been diagnosed. MOB reported that she was never placed on any medications or in therapy. CSW offered MOB therapy resources in which  MOB declines at this time. MOB expressed not feeling SI or Hi and expressed that she is not involved in a DV relationship. MOB advised CSW that she has all needed items to care for infant with no further needs or concerns currently.   CSW provided education regarding Baby Blues vs PMADs and provided MOB with resources for mental health follow up.  CSW encouraged MOB to evaluate her mental health throughout the postpartum period with the use of the New Mom Checklist developed by Postpartum Progress as well as the [place] Postnatal Depression Scale and notify a medical professional if symptoms arise.     Claude Manges Dacota Devall, MSW, LCSW [facility] [phone]

## 2019-06-12 NOTE — Discharge Instructions (Signed)

## 2019-06-14 ENCOUNTER — Encounter: Payer: No Typology Code available for payment source | Admitting: Certified Nurse Midwife

## 2019-06-18 ENCOUNTER — Other Ambulatory Visit: Payer: No Typology Code available for payment source

## 2019-06-21 ENCOUNTER — Inpatient Hospital Stay (HOSPITAL_COMMUNITY)
Admission: AD | Admit: 2019-06-21 | Payer: No Typology Code available for payment source | Source: Home / Self Care | Admitting: Obstetrics & Gynecology

## 2019-06-21 ENCOUNTER — Inpatient Hospital Stay (HOSPITAL_COMMUNITY): Payer: No Typology Code available for payment source

## 2019-07-09 ENCOUNTER — Ambulatory Visit: Payer: No Typology Code available for payment source | Admitting: Certified Nurse Midwife

## 2019-07-24 ENCOUNTER — Encounter: Payer: Self-pay | Admitting: Advanced Practice Midwife

## 2019-07-24 ENCOUNTER — Telehealth (INDEPENDENT_AMBULATORY_CARE_PROVIDER_SITE_OTHER): Payer: No Typology Code available for payment source | Admitting: Advanced Practice Midwife

## 2019-07-24 NOTE — Progress Notes (Addendum)
I connected with@ on 07/24/19 at  1:20 PM EDT by: MyChart video visit and verified that I am speaking with the correct person using two identifiers.  Patient is located at home and provider is located at [facility].     The purpose of this virtual visit is to provide medical care while limiting exposure to the novel coronavirus. I discussed the limitations, risks, security and privacy concerns of performing an evaluation and management service by Mychart and the availability of in person appointments. I also discussed with the patient that there may be a patient responsible charge related to this service. By engaging in this virtual visit, you consent to the provision of healthcare.  Additionally, you authorize for your insurance to be billed for the services provided during this visit.  The patient expressed understanding and agreed to proceed.  The following staff members participated in the virtual visit:  Sharen Counter, CNM  Post Partum Visit Note Subjective:   Jordan [name] is a 29 y.o. [phone] female being evaluated for postpartum followup.  She is 6 weeks postpartum following a normal spontaneous vaginal delivery at  [redacted]w[redacted]d gestational weeks.  I have fully reviewed the prenatal and intrapartum coursey .  Postpartum course has been unremarkable. Baby is doing well. Baby is feeding by breast. Bleeding no bleeding. Bowel function is normal. Bladder function is normal. Patient is not sexually active. Contraception method is undecided. Postpartum depression screening: negative (score 3)   The following portions of the patient's history were reviewed and updated as appropriate: allergies, current medications, past family history, past medical history, past social history, past surgical history and problem list.  Review of Systems Pertinent items noted in HPI and remainder of comprehensive ROS otherwise negative.   Objective:   Vitals:   07/24/19 1315  BP: 107/72   Self-Obtained       Assessment:   Normal postpartum exam.  Plan:  Essential components of care per ACOG recommendations:  1.  Mood and well being: Patient with negative depression screening today. Reviewed local resources for support.  - Patient does not use tobacco  2. Infant care and feeding:  -Patient currently breastmilk feeding? Yes    If breastmilk feeding discussed return to work and pumping. If needed, patient was provided letter for work to allow for every 2-3 hr pumping breaks, and to be granted a private location to express breastmilk and refrigerated area to store breastmilk. Reviewed importance of draining breast regularly to support lactation. -Social determinants of health (SDOH) reviewed in EPIC. No concerns  3. Sexuality, contraception and birth spacing - Patient does not want a pregnancy in the next year.  Desired family size is 2 children.  - Reviewed forms of contraception in tiered fashion. Patient desired undecided today.   --Will send printed materials about contraceptive choices and pt to contact office for contraceptive appointment. --Discussed lactational amenorrhea as contraceptive method with frequent feeding and no supplementation and that pumping may not provider as much ovulation suppression.  4. Sleep and fatigue -Encouraged family/partner/community support of 4 hrs of uninterrupted sleep to help with mood and fatigue  5. Physical Recovery  - Discussed patients delivery -- Patient has urinary incontinence? No - Patient is safe to resume physical and sexual activity  6.  Health Maintenance - Last pap smear done 05/23/2017 and was normal     10 minutes of non-face-to-face time spent with the patient    Sharen Counter, CNM [facility] [employer], [facility]

## 2019-08-24 ENCOUNTER — Other Ambulatory Visit (HOSPITAL_COMMUNITY)
Admission: RE | Admit: 2019-08-24 | Discharge: 2019-08-24 | Disposition: A | Discharge status: Home / Self Care | Payer: No Typology Code available for payment source | Source: Ambulatory Visit | Attending: Certified Nurse Midwife | Admitting: Certified Nurse Midwife

## 2019-08-24 ENCOUNTER — Other Ambulatory Visit: Payer: Self-pay

## 2019-08-24 ENCOUNTER — Encounter: Payer: Self-pay | Admitting: Certified Nurse Midwife

## 2019-08-24 ENCOUNTER — Ambulatory Visit (INDEPENDENT_AMBULATORY_CARE_PROVIDER_SITE_OTHER): Payer: No Typology Code available for payment source | Admitting: Certified Nurse Midwife

## 2019-08-24 VITALS — BP 116/73 | HR 81 | Ht 62.0 in | Wt 151.0 lb

## 2019-08-24 DIAGNOSIS — R32 Unspecified urinary incontinence: Secondary | ICD-10-CM

## 2019-08-24 DIAGNOSIS — Z01419 Encounter for gynecological examination (general) (routine) without abnormal findings: Secondary | ICD-10-CM

## 2019-08-24 DIAGNOSIS — N8189 Other female genital prolapse: Secondary | ICD-10-CM | POA: Diagnosis not present

## 2019-08-24 DIAGNOSIS — N898 Other specified noninflammatory disorders of vagina: Secondary | ICD-10-CM | POA: Insufficient documentation

## 2019-08-24 NOTE — Progress Notes (Signed)
Gynecology Annual Exam   History of Present Illness: Jordan [name] is a 29 y.o. married female presenting for an annual exam. She has complaints of malodorous light yellow vaginal discharge but now things it may be urine leakage. She is not sexually active since giving birth in March. She is breastfeeding. There is no notable family history of breast or ovarian cancer in her family.  Past Medical History:  Past Medical History:  Diagnosis Date  . Migraines   . PROM (premature rupture of membranes) 03/13/2018  . Supervision of normal first pregnancy, antepartum 08/08/2017    Nursing Staff Provider Office Location  [facility] Midwife preferred. No students/residents. Dating   LMP and U/S Language  English Anatomy US   normal female, anterior placenta Flu Vaccine  10/19 [facility] Employee Genetic Screen  NIPS: negative  AFP: neg TDaP vaccine   Given 10/8 Hgb A1C or  GTT Early  Third trimester 2 hour GTT wnl Rhogam   NA   LAB RESULTS  Feeding Plan  Breast Blood Type O/Positive/-    Past Surgical History:  Past Surgical History:  Procedure Laterality Date  . WISDOM TOOTH EXTRACTION  May 2012    Gynecologic History:  LMP: No LMP recorded. Contraception: husband planning vasectomy Last Pap: completed on 2019 ; result was: no abnormalities   Obstetric History: G2P2002  Family History:  Family History  Problem Relation Age of Onset  . Hypertension Father   . Hyperlipidemia Father   . Cancer Maternal Grandmother   . Cancer Maternal Grandfather   . Cancer Paternal Grandmother     Social History:  Social History   Socioeconomic History  . Marital status: Married    Spouse name: Minerva Areola  . Number of children: 0  . Years of education: BSN  . Highest education level: Not on file  Occupational History  . Occupation: - nurse  Tobacco Use  . Smoking status: Never Smoker  . Smokeless tobacco: Never Used  Substance and Sexual Activity  . Alcohol use: Not Currently  . Drug use: No  .  Sexual activity: Yes    Birth control/protection: None  Other Topics Concern  . Not on file  Social History Narrative   Nurse at [employer]   Lives with roommate   Caffeine use: 3-4 times per week Neita Carp)   Right handed   Social Determinants of Health   Financial Resource Strain:   . Difficulty of Paying Living Expenses:   Food Insecurity:   . Worried About Programme researcher, broadcasting/film/video in the Last Year:   . Barista in the Last Year:   Transportation Needs:   . Freight forwarder (Medical):   Marland Kitchen Lack of Transportation (Non-Medical):   Physical Activity:   . Days of Exercise per Week:   . Minutes of Exercise per Session:   Stress:   . Feeling of Stress :   Social Connections:   . Frequency of Communication with Friends and Family:   . Frequency of Social Gatherings with Friends and Family:   . Attends Religious Services:   . Active Member of Clubs or Organizations:   . Attends Banker Meetings:   Marland Kitchen Marital Status:   Intimate Partner Violence:   . Fear of Current or Ex-Partner:   . Emotionally Abused:   Marland Kitchen Physically Abused:   . Sexually Abused:     Allergies:  Allergies  Allergen Reactions  . Latex Swelling and Rash    Medications: Prior to  Admission medications   Medication Sig Start Date End Date Taking? Authorizing Provider  Prenatal Vit-Fe Fumarate-FA (PRENATAL MULTIVITAMIN) TABS tablet Take 1 tablet by mouth daily at 12 noon.   Yes [provider]  acetaminophen (TYLENOL) 500 MG tablet Take 500 mg by mouth every 8 (eight) hours as needed for mild pain or headache.    [provider]  calcium carbonate (TUMS) 500 MG chewable tablet Chew 2 tablets by mouth 3 (three) times daily as needed for indigestion or heartburn.     [provider]  famotidine (PEPCID) 20 MG tablet Take 20 mg by mouth 2 (two) times daily.  05/18/19   [provider]  ferrous sulfate 325 (65 FE) MG tablet Take 1 tablet (325 mg total) by mouth  every other day. Patient not taking: Reported on 08/24/2019 06/12/19   Clarnce Flock, MD  ibuprofen (ADVIL) 600 MG tablet Take 1 tablet (600 mg total) by mouth every 6 (six) hours. Patient not taking: Reported on 07/24/2019 06/12/19   Clarnce Flock, MD    Review of Systems: negative except noted in HPI  Physical Exam Vitals: BP 116/73   Pulse 81   Ht 5\' 2"  (1.575 m)   Wt 151 lb (68.5 kg)   BMI 27.62 kg/m  General: NAD HEENT: normocephalic, atraumatic Thyroid: no enlargement, no palpable nodules Pulmonary: Normal rate and effort, CTAB Cardiovascular: RRR Breast: Breast symmetrical, no tenderness, no palpable nodules or masses, no skin or nipple retraction present, no nipple discharge. No axillary or supraclavicular lymphadenopathy. Abdomen: soft, non-tender, non-distended. No hepatomegaly, splenomegaly or masses palpable. No evidence of hernia  Genitourinary:  External: Normal external female genitalia. Normal urethral meatus  Vagina: Normal vaginal mucosa, mild cystocele, poor tone  Cervix: Grossly normal in appearance, no bleeding             Rectal: deferred Extremities: no edema, erythema, or tenderness Neurologic: Grossly intact Psychiatric: mood appropriate, affect full  Female chaperone present for pelvic and breast portions of the physical exam  Assessment:  1. Vaginal discharge   2. Well woman exam   3. Urinary incontinence, unspecified type   4. Pelvic floor weakness    Plan: Aptima swab today Incontinence likely d/t recent postpartum and BF, will refer for pelvic function eval Plans to use condoms until husband vasectomy and clearance Follow up with GYN in 1 year or prn Follow up with PCP as planned  Julianne Handler, CNM 08/24/2019 11:42 AM

## 2019-08-27 ENCOUNTER — Ambulatory Visit: Payer: 59 | Admitting: Adult Health

## 2019-08-28 LAB — CERVICOVAGINAL ANCILLARY ONLY
Bacterial Vaginitis (gardnerella): NEGATIVE
Candida Glabrata: NEGATIVE
Candida Vaginitis: NEGATIVE
Comment: NEGATIVE
Comment: NEGATIVE
Comment: NEGATIVE

## 2019-10-24 ENCOUNTER — Encounter: Payer: Self-pay | Admitting: *Deleted

## 2019-11-14 ENCOUNTER — Ambulatory Visit: Payer: Self-pay | Admitting: Adult Health

## 2020-03-27 ENCOUNTER — Ambulatory Visit: Payer: Self-pay | Admitting: Adult Health

## 2020-04-17 ENCOUNTER — Ambulatory Visit: Payer: No Typology Code available for payment source | Admitting: Adult Health

## 2020-04-17 ENCOUNTER — Encounter: Payer: Self-pay | Admitting: Adult Health

## 2020-04-17 VITALS — BP 106/70 | HR 101 | Ht 62.0 in | Wt 130.0 lb

## 2020-04-17 DIAGNOSIS — G43119 Migraine with aura, intractable, without status migrainosus: Secondary | ICD-10-CM

## 2020-04-17 NOTE — Patient Instructions (Signed)
Your Plan:  Consider nortriptyline or propranolol  If your symptoms worsen or you develop new symptoms please let us know.   Thank you for coming to see [place] at [facility]. I hope we have been able to provide you high quality care today.  You may receive a patient satisfaction survey over the next few weeks. We would appreciate your feedback and comments so that we may continue to improve ourselves and the health of our patients.

## 2020-04-17 NOTE — Progress Notes (Signed)
PATIENT: Jordan [name] DOB: 20-Nov-1990  REASON FOR VISIT: follow up HISTORY FROM: patient  HISTORY OF PRESENT ILLNESS: Today 04/17/20:  Jordan [name] is a 30 year old female with a history of migraine headaches.  She returns today for follow-up.  She is currently breast-feeding.  She states that she is having approximately 2-3 migraines a week.  They typically occur above the left eye.  She does have photophobia and phonophobia as well as nausea but no vomiting.  She is interested in trying preventative medication as long as safe during breast-feeding.  HISTORY 01/01/19:  Jordan [name] is a 30 year old female with a history of migraine headaches.  She returns today for follow-up.  She delivered in [place] baby girl.  She states that delivery went well and baby is healthy.  She is now [redacted] weeks pregnant-this was unplanned.  She states that her headaches are slightly worse during this pregnancy than the last.  She states that she has about 4-5 headaches a week.  However the headaches are manageable.  She states that in the last 9 months she is only had 3-4 actual migraine headaches.  She states that she typically can take Tylenol and that decreases the headache severity.  She does not wish to be on any additional medication at this time.   REVIEW OF SYSTEMS: Out of a complete 14 system review of symptoms, the patient complains only of the following symptoms, and all other reviewed systems are negative.  See HPI  ALLERGIES: Allergies  Allergen Reactions  . Latex Swelling and Rash    HOME MEDICATIONS: Outpatient Medications Prior to Visit  Medication Sig Dispense Refill  . acetaminophen (TYLENOL) 500 MG tablet Take 500 mg by mouth every 8 (eight) hours as needed for mild pain or headache.    . calcium carbonate (TUMS) 500 MG chewable tablet Chew 2 tablets by mouth 3 (three) times daily as needed for indigestion or heartburn.     . famotidine (PEPCID) 20 MG tablet Take 20 mg by mouth 2  (two) times daily.     . ferrous sulfate 325 (65 FE) MG tablet Take 1 tablet (325 mg total) by mouth every other day. (Patient not taking: Reported on 08/24/2019) 30 tablet 1  . ibuprofen (ADVIL) 600 MG tablet Take 1 tablet (600 mg total) by mouth every 6 (six) hours. (Patient not taking: Reported on 07/24/2019) 30 tablet 0  . Prenatal Vit-Fe Fumarate-FA (PRENATAL MULTIVITAMIN) TABS tablet Take 1 tablet by mouth daily at 12 noon.     No facility-administered medications prior to visit.    PAST MEDICAL HISTORY: Past Medical History:  Diagnosis Date  . Migraines   . PROM (premature rupture of membranes) 03/13/2018  . Supervision of normal first pregnancy, antepartum 08/08/2017    Nursing Staff Provider Office Location  [facility] Midwife preferred. No students/residents. Dating   LMP and U/S Language  English Anatomy US   normal female, anterior placenta Flu Vaccine  10/19 [facility] Employee Genetic Screen  NIPS: negative  AFP: neg TDaP vaccine   Given 10/8 Hgb A1C or  GTT Early  Third trimester 2 hour GTT wnl Rhogam   NA   LAB RESULTS  Feeding Plan  Breast Blood Type O/Positive/-    PAST SURGICAL HISTORY: Past Surgical History:  Procedure Laterality Date  . WISDOM TOOTH EXTRACTION  May 2012    FAMILY HISTORY: Family History  Problem Relation Age of Onset  . Hypertension Father   . Hyperlipidemia Father   . Cancer Maternal  Grandmother   . Cancer Maternal Grandfather   . Cancer Paternal Grandmother     SOCIAL HISTORY: Social History   Socioeconomic History  . Marital status: Married    Spouse name: Minerva Areola  . Number of children: 0  . Years of education: BSN  . Highest education level: Not on file  Occupational History  . 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 Smoking status: Never Smoker  . Smokeless tobacco: Never Used  Vaping Use  . Vaping Use: Never used  Substance and Sexual Activity  . Alcohol use: Not Currently  . Drug use: No  . Sexual activity: Yes    Birth  control/protection: None  Other Topics Concern  . Not on file  Social History Narrative   Nurse at [employer]   Lives with roommate   Caffeine use: 3-4 times per week Neita Carp)   Right handed   Social Determinants of Health   Financial Resource Strain: Not on file  Food Insecurity: Not on file  Transportation Needs: Not on file  Physical Activity: Not on file  Stress: Not on file  Social Connections: Not on file  Intimate Partner Violence: Not on file      PHYSICAL EXAM  Vitals:   04/17/20 1504  BP: 106/70  Pulse: (!) 101  Weight: 130 lb (59 kg)  Height: 5\' 2"  (1.575 m)   Body mass index is 23.78 kg/m.  Generalized: Well developed, in no acute distress   Neurological examination  Mentation: Alert oriented to time, place, history taking. Follows all commands speech and language fluent Cranial nerve II-XII: Pupils were equal round reactive to light. Extraocular movements were full, visual field were full on confrontational test. Head turning and shoulder shrug  were normal and symmetric. Motor: The motor testing reveals 5 over 5 strength of all 4 extremities. Good symmetric motor tone is noted throughout.  Sensory: Sensory testing is intact to soft touch on all 4 extremities. No evidence of extinction is noted.  Coordination: Cerebellar testing reveals good finger-nose-finger and heel-to-shin bilaterally.  Gait and station: Gait is normal.  Reflexes: Deep tendon reflexes are symmetric and normal bilaterally.   DIAGNOSTIC DATA (LABS, IMAGING, TESTING) - I reviewed patient records, labs, notes, testing and imaging myself where available.  Lab Results  Component Value Date   WBC 11.5 (H) 06/10/2019   HGB 10.9 (L) 06/10/2019   HCT 32.8 (L) 06/10/2019   MCV 97.3 06/10/2019   PLT 166 06/10/2019      Component Value Date/Time   NA 137 05/11/2017 1844   K 4.0 05/11/2017 1844   CL 102 05/11/2017 1844   CO2 26 05/11/2017 1844   GLUCOSE 97 05/11/2017 1844   BUN 11  05/11/2017 1844   CREATININE 0.76 05/11/2017 1844   CALCIUM 8.7 (L) 05/11/2017 1844   GFRNONAA >60 05/11/2017 1844   GFRAA >60 05/11/2017 1844   No results found for: CHOL, HDL, LDLCALC, LDLDIRECT, TRIG, CHOLHDL No results found for: 07/09/2017 No results found for: VITAMINB12 Lab Results  Component Value Date   TSH 3.71 12/23/2015      ASSESSMENT AND PLAN 30 y.o. year old female  has a past medical history of Migraines, PROM (premature rupture of membranes) (03/13/2018), and Supervision of normal first pregnancy, antepartum (08/08/2017). here with:  1.  Migraine headaches  Discussed several options for preventative therapies while breast-feeding.  There is options including nortriptyline and propranolol.  The patient plans to stop breast-feeding in March.  I provided her with  some information regarding taking nortriptyline and propranolol during lactation.  She plans to read over this information and let me know if she would like to proceed.  For now she will continue taking over-the-counter medication to treat her headache such as Tylenol.  She will follow-up in 3 months or sooner if needed   I spent 30 minutes of face-to-face and non-face-to-face time with patient.  This included previsit chart review, lab review, study review, order entry, electronic health record documentation, patient education.  Butch Penny, MSN, NP-C 04/17/2020, 3:48 PM [facility] [address] [place], [place] [address] [phone]

## 2020-04-18 NOTE — Progress Notes (Signed)
I have read the note, and I agree with the clinical assessment and plan.  Alisandra Son K Amdrew Oboyle   

## 2020-06-06 IMAGING — US US MFM OB FOLLOW-UP
1 series · 13 of 28 positions shown · non-contrast
Comparison: none

[Series 1: us mfm ob follow-up · 13 of 38 slices shown]
[im 2/38]
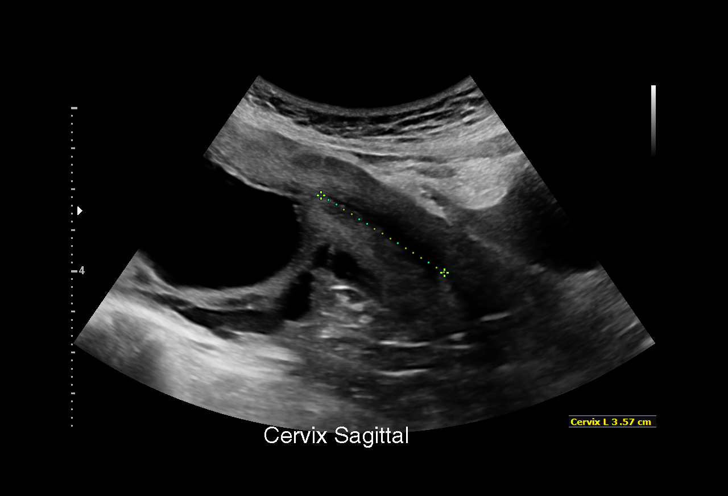
[im 5/38]
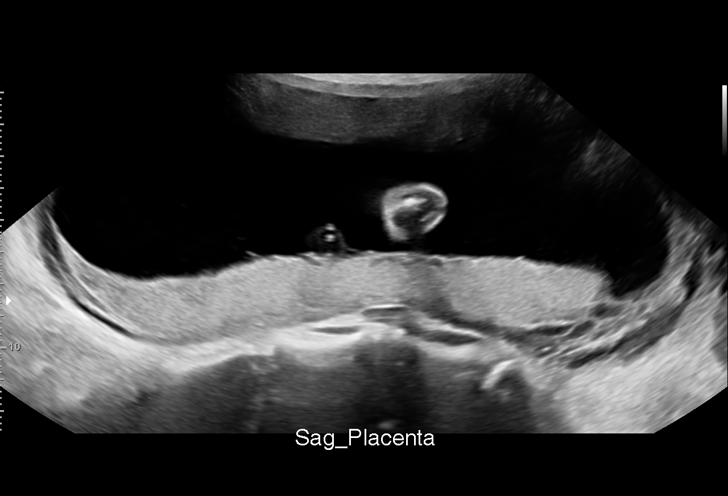
[im 7/38]
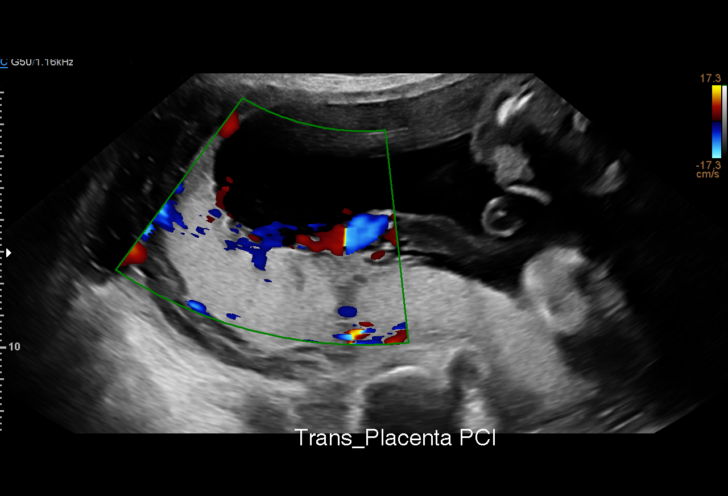
[im 10/38]
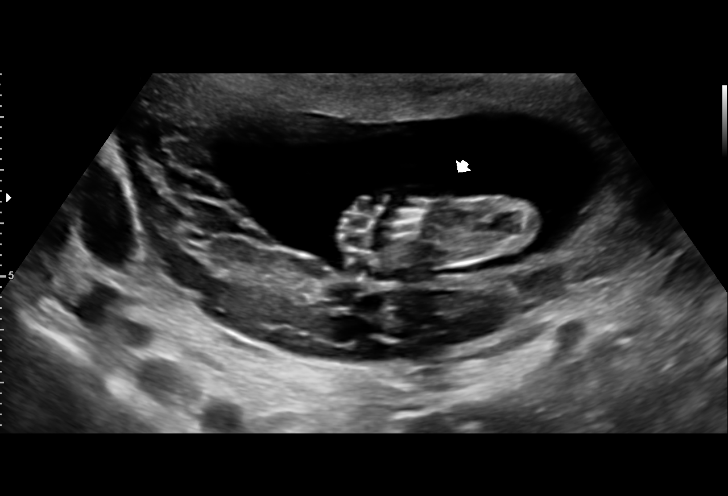
[im 13/38]
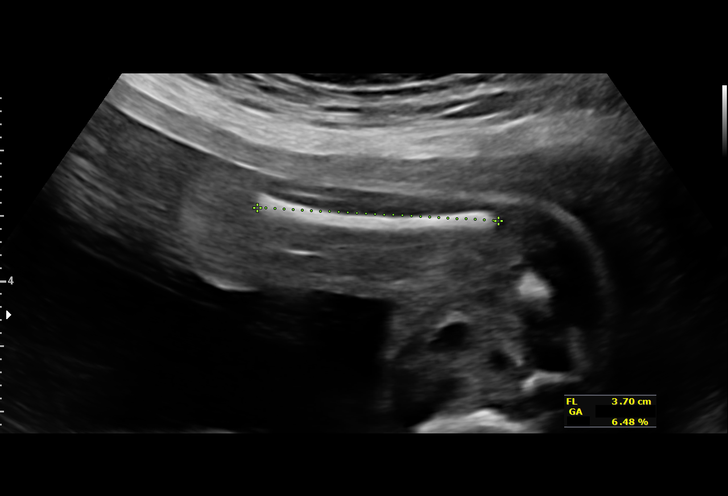
[im 16/38]
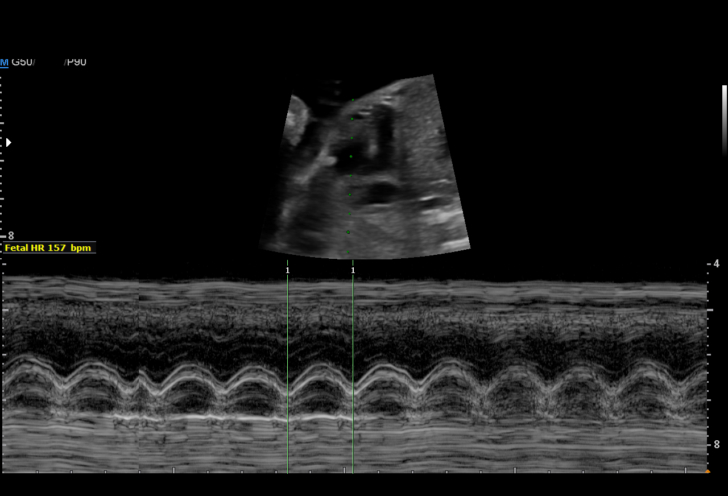
[im 20/38]
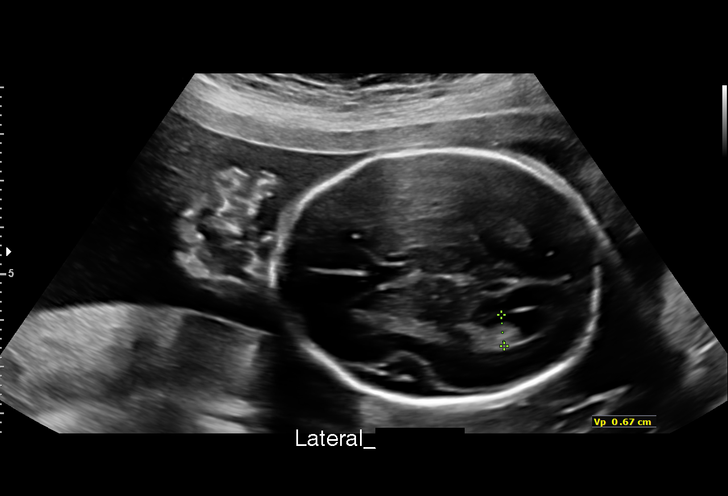
[im 22/38]
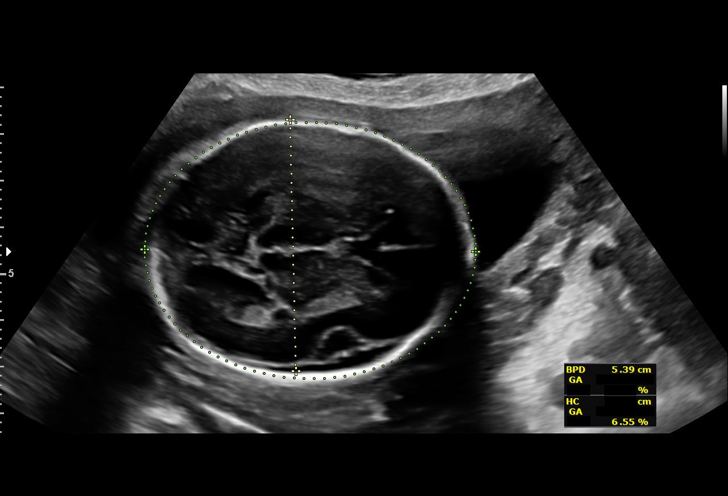
[im 25/38]
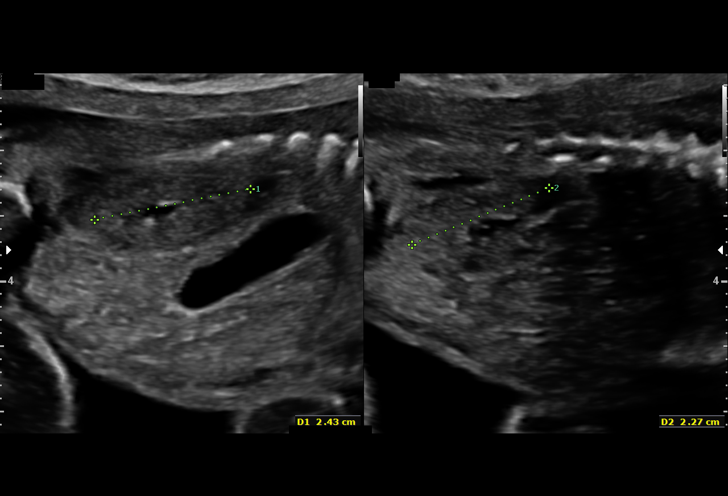
[im 28/38]
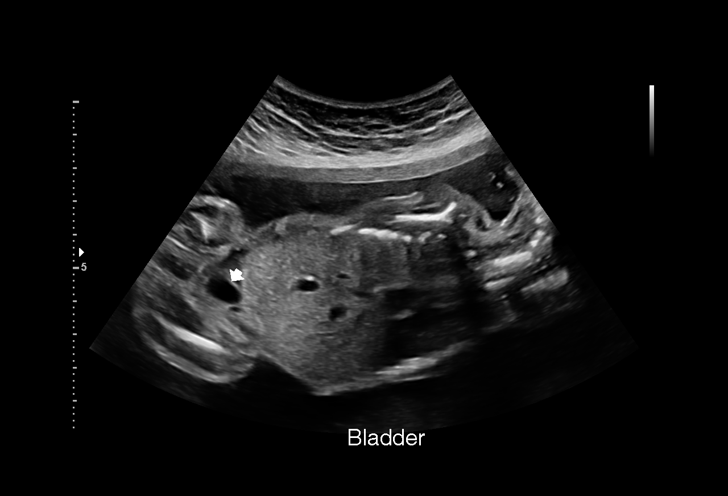
[im 31/38]
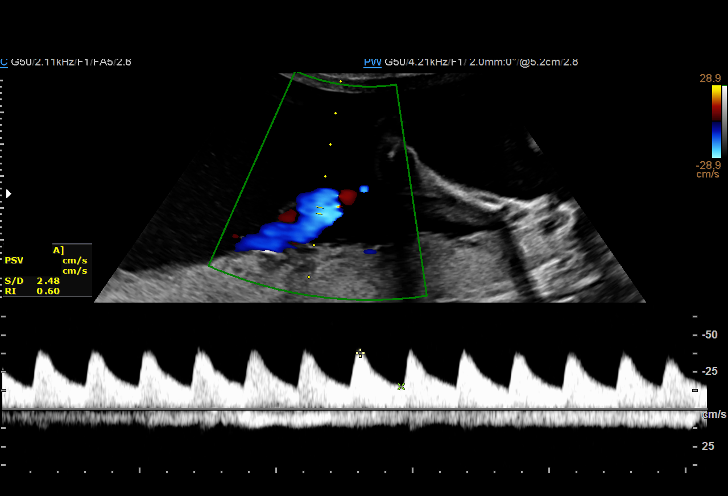
[im 33/38]
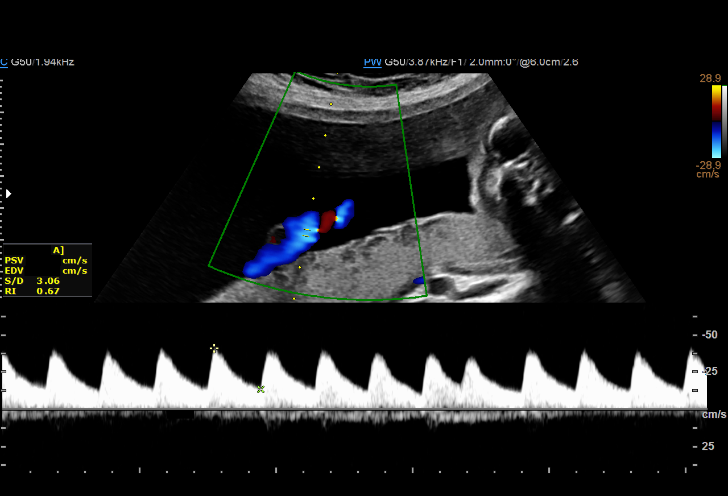
[im 36/38]
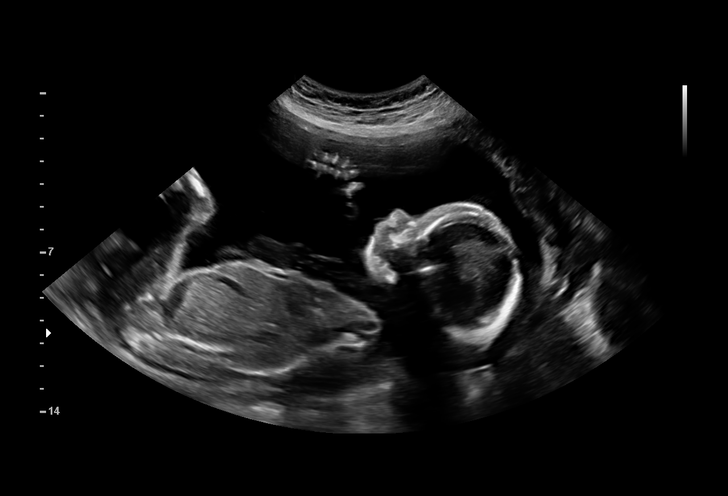

[13 of 28 positions shown; findings below may reference images not displayed]

----------------------------------------------------------------------

 ----------------------------------------------------------------------
Indications

  Antenatal screening for malformations (low
  risk NIPS)
  23 weeks gestation of pregnancy
  Maternal care for known or suspected poor
  fetal growth, second trimester, fetus 1 IUGR
 ----------------------------------------------------------------------
Fetal Evaluation

 Num Of Fetuses:         1
 Fetal Heart Rate(bpm):  157
 Cardiac Activity:       Observed
 Presentation:           Variable
 Placenta:               Posterior
 P. Cord Insertion:      Previously Visualized

 Amniotic Fluid
 AFI FV:      Within normal limits

                             Largest Pocket(cm)

Biometry

 BPD:        54  mm     G. Age:  22w 3d         20  %    CI:        72.64   %    70 - 86
                                                         FL/HC:      17.8   %    19.2 -
 HC:      201.5  mm     G. Age:  22w 2d          9  %    HC/AC:      1.14        1.05 -
 AC:      176.8  mm     G. Age:  22w 4d         24  %    FL/BPD:     66.3   %    71 - 87
 FL:       35.8  mm     G. Age:  21w 2d          3  %    FL/AC:      20.2   %    20 - 24
 HUM:      34.8  mm     G. Age:  21w 6d         18  %

 LV:        6.7  mm

 Est. FW:     473  gm      1 lb 1 oz      7  %
OB History

 Gravidity:    2         Term:   1        Prem:   0        SAB:   0
 TOP:          0       Ectopic:  0        Living: 1
Gestational Age

 LMP:           23w 1d        Date:  09/07/18                 EDD:   06/14/19
 U/S Today:     22w 1d                                        EDD:   06/21/19
 Best:          23w 1d     Det. By:  LMP  (09/07/18)          EDD:   06/14/19
Anatomy

 Cranium:               Appears normal         LVOT:                   Previously seen
 Cavum:                 Appears normal         Aortic Arch:            Previously seen
 Ventricles:            Appears normal         Ductal Arch:            Previously seen
 Choroid Plexus:        Previously seen        Diaphragm:              Appears normal
 Cerebellum:            Previously seen        Stomach:                Appears normal, left
                                                                       sided
 Posterior Fossa:       Previously seen        Abdomen:                Appears normal
 Nuchal Fold:           Not applicable (>20    Abdominal Wall:         Previously seen
                        wks GA)
 Face:                  Orbits and profile     Cord Vessels:           Not well visualized
                        previously seen
 Lips:                  Previously seen        Kidneys:                Appear normal
 Palate:                Previously seen        Bladder:                Appears normal
 Thoracic:              Appears normal         Spine:                  Previously seen
 Heart:                 Previously seen        Upper Extremities:      Previously seen
 RVOT:                  Previously seen        Lower Extremities:      Previously seen
Doppler - Fetal Vessels

 Umbilical Artery
  S/D     %tile
 2.68         9

Cervix Uterus Adnexa

 Cervix
 Length:           3.57  cm.
Impression

 Patient return for fetal growth assessment.  Her EDD is
 established by her sure LMP date and it is consistent with 10-
 week ultrasound (5-day discrepancy).
 The estimated fetal weight is at the 7th percentile.  Amniotic
 fluid is normal and good fetal activity seen.  Umbilical artery
 Doppler study, performed because of fetal growth restriction
 (defined as estimated fetal weight less than 10th percentile),
 showed normal forward diastolic flow.
 I explained the findings suggesting possible fetal growth
 restriction that requires serial fetal growth assessments to
 confirm the diagnosis.
Recommendations

 -An appointment was made for her to return in 3 weeks for
 fetal growth assessment.
                      Elvin, Jue

## 2020-06-12 ENCOUNTER — Ambulatory Visit: Payer: No Typology Code available for payment source | Admitting: Obstetrics and Gynecology

## 2020-06-12 ENCOUNTER — Encounter: Payer: Self-pay | Admitting: Obstetrics and Gynecology

## 2020-06-12 ENCOUNTER — Other Ambulatory Visit: Payer: Self-pay

## 2020-06-12 ENCOUNTER — Other Ambulatory Visit (HOSPITAL_COMMUNITY)
Admission: RE | Admit: 2020-06-12 | Discharge: 2020-06-12 | Disposition: A | Discharge status: Home / Self Care | Payer: No Typology Code available for payment source | Source: Ambulatory Visit | Attending: Obstetrics and Gynecology | Admitting: Obstetrics and Gynecology

## 2020-06-12 VITALS — BP 114/76 | HR 69 | Resp 16 | Ht 62.0 in | Wt 124.0 lb

## 2020-06-12 DIAGNOSIS — Z124 Encounter for screening for malignant neoplasm of cervix: Secondary | ICD-10-CM | POA: Insufficient documentation

## 2020-06-12 DIAGNOSIS — N8189 Other female genital prolapse: Secondary | ICD-10-CM

## 2020-06-12 NOTE — Progress Notes (Signed)
30 yo P2 presenting today for the evaluation of possible uterine prolapse. Patient was last seen in may 2021 with concerns of pelvic floor weakness and resulting urinary incontinence. Patient reports no improvement in urge urinary incontinence since her last visit. She has not been able to be seen by a physical therapist. Patient now reports noticing a bulge when she washes and is concern that it may be her cervix. Patient is without any complaints. She is sexually active with her husband. Patient denies pelvic pain or abnormal discharge  Past Medical History:  Diagnosis Date   Migraines    PROM (premature rupture of membranes) 03/13/2018   Supervision of normal first pregnancy, antepartum 08/08/2017    Nursing Staff Provider Office Location  [facility] Midwife preferred. No students/residents. Dating   LMP and U/S Language  English Anatomy US   normal female, anterior placenta Flu Vaccine  10/19 [employer] Employee Genetic Screen  NIPS: negative  AFP: neg TDaP vaccine   Given 10/8 Hgb A1C or  GTT Early  Third trimester 2 hour GTT wnl Rhogam   NA   LAB RESULTS  Feeding Plan  Breast Blood Type O/Positive/-   Past Surgical History:  Procedure Laterality Date   WISDOM TOOTH EXTRACTION  May 2012   Family History  Problem Relation Age of Onset   Hypertension Father    Hyperlipidemia Father    Cancer Maternal Grandmother    Cancer Maternal Grandfather    Cancer Paternal Grandmother    Social History   Tobacco Use   Smoking status: Never Smoker   Smokeless tobacco: Never Used  Vaping Use   Vaping Use: Never used  Substance Use Topics   Alcohol use: Not Currently   Drug use: No   ROS See pertinent in HPI. All other systems reviewed and non contributory  Blood pressure 114/76, pulse 69, resp. rate 16, height 5\' 2"  (1.575 m), weight 124 lb (56.2 kg), last menstrual period 06/07/2020, currently breastfeeding. GENERAL: Well-developed, well-nourished female in no acute distress.  ABDOMEN:  Soft, nontender, nondistended. No organomegaly. PELVIC: Normal external female genitalia. Vagina is pink and rugated.  Poor vaginal tone. Normal discharge. Normal appearing cervix. Uterus is normal in size.  No adnexal mass or tenderness. EXTREMITIES: No cyanosis, clubbing, or edema, 2+ distal pulses.  A/P 30 yo P2 with urge incontinence and pelvic floor weakness - pap smear collected - patient declined STI testing - Reassurance provided as there is no evidence of uterine prolapse - persistent pelvic floor weakness since delivery- new referral to physical therapy placed - patient will be contacted with abnormal pap smear results

## 2020-06-17 LAB — CYTOLOGY - PAP
Diagnosis: NEGATIVE
Diagnosis: REACTIVE

## 2020-06-19 ENCOUNTER — Ambulatory Visit: Payer: No Typology Code available for payment source | Admitting: Physical Therapy

## 2020-07-15 ENCOUNTER — Telehealth (INDEPENDENT_AMBULATORY_CARE_PROVIDER_SITE_OTHER): Payer: No Typology Code available for payment source | Admitting: Adult Health

## 2020-07-15 DIAGNOSIS — G43119 Migraine with aura, intractable, without status migrainosus: Secondary | ICD-10-CM | POA: Diagnosis not present

## 2020-07-15 MED ORDER — NORTRIPTYLINE HCL 10 MG PO CAPS: 10.0000 mg | ORAL_CAPSULE | Freq: Every day | ORAL | 3 refills | Status: DC

## 2020-07-15 MED ORDER — RIZATRIPTAN BENZOATE 10 MG PO TABS: 10.0000 mg | ORAL_TABLET | ORAL | 11 refills | Status: DC | PRN

## 2020-07-15 NOTE — Progress Notes (Signed)
PATIENT: Jordan [name] DOB: 21-Jul-1990  REASON FOR VISIT: follow up HISTORY FROM: patient  Virtual Visit via Video Note  I connected with Jordan [name] on 07/15/20 at  2:30 PM EDT by a video enabled telemedicine application located remotely at [facility] and verified that I am speaking with the correct person using two identifiers who was located at their own home.   I discussed the limitations of evaluation and management by telemedicine and the availability of in person appointments. The patient expressed understanding and agreed to proceed.   PATIENT: Jordan [name] DOB: 14-Mar-1991  REASON FOR VISIT: follow up HISTORY FROM: patient  HISTORY OF PRESENT ILLNESS: Today 07/15/20:  Jordan [name] is a 30 year old female with a history of migraine headaches.  She returns today for follow-up.  She states that she is no longer breast-feeding.  She has approximately 1-3 headaches a week.  These are typically mild headaches that respond well to Tylenol.  She has approximately 2 severe headaches a month that does not respond to medication.  Her severe headaches usually occur around her menstrual cycle.  In the past nortriptyline and Maxalt have worked well for her.  HISTORY 04/17/20:  Jordan [name] is a 30 year old female with a history of migraine headaches.  She returns today for follow-up.  She is currently breast-feeding.  She states that she is having approximately 2-3 migraines a week.  They typically occur above the left eye.  She does have photophobia and phonophobia as well as nausea but no vomiting.  She is interested in trying preventative medication as long as safe during breast-feeding.  REVIEW OF SYSTEMS: Out of a complete 14 system review of symptoms, the patient complains only of the following symptoms, and all other reviewed systems are negative.  See HPI  ALLERGIES: Allergies  Allergen Reactions  . Latex Swelling and Rash    HOME  MEDICATIONS: Outpatient Medications Prior to Visit  Medication Sig Dispense Refill  . acetaminophen (TYLENOL) 500 MG tablet Take 500 mg by mouth every 8 (eight) hours as needed for mild pain or headache.     No facility-administered medications prior to visit.    PAST MEDICAL HISTORY: Past Medical History:  Diagnosis Date  . Migraines   . PROM (premature rupture of membranes) 03/13/2018  . Supervision of normal first pregnancy, antepartum 08/08/2017    Nursing Staff Provider Office Location  [facility] Midwife preferred. No students/residents. Dating   LMP and U/S Language  English Anatomy US   normal female, anterior placenta Flu Vaccine  10/19 [employer] Employee Genetic Screen  NIPS: negative  AFP: neg TDaP vaccine   Given 10/8 Hgb A1C or  GTT Early  Third trimester 2 hour GTT wnl Rhogam   NA   LAB RESULTS  Feeding Plan  Breast Blood Type O/Positive/-    PAST SURGICAL HISTORY: Past Surgical History:  Procedure Laterality Date  . WISDOM TOOTH EXTRACTION  May 2012    FAMILY HISTORY: Family History  Problem Relation Age of Onset  . Hypertension Father   . Hyperlipidemia Father   . Cancer Maternal Grandmother   . Cancer Maternal Grandfather   . Cancer Paternal Grandmother     SOCIAL HISTORY: Social History   Socioeconomic History  . Marital status: Married    Spouse name: Minerva Areola  . Number of children: 0  . Years of education: BSN  . Highest education level: Not on file  Occupational History  . 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 Smoking status: Never Smoker  . Smokeless tobacco: Never Used  Vaping Use  . Vaping Use: Never used  Substance and Sexual Activity  . Alcohol use: Not Currently  . Drug use: No  . Sexual activity: Yes    Birth control/protection: None    Comment: Vasectomy  Other Topics Concern  . Not on file  Social History Narrative   Nurse at [employer]   Lives with roommate   Caffeine use: 3-4 times per week Neita Carp)   Right handed   Social  Determinants of Health   Financial Resource Strain: Not on file  Food Insecurity: Not on file  Transportation Needs: Not on file  Physical Activity: Not on file  Stress: Not on file  Social Connections: Not on file  Intimate Partner Violence: Not on file      PHYSICAL EXAM Generalized: Well developed, in no acute distress   Neurological examination  Mentation: Alert oriented to time, place, history taking. Follows all commands speech and language fluent Cranial nerve II-XII:Extraocular movements were full. Facial symmetry noted. uvula tongue midline. Head turning and shoulder shrug  were normal and symmetric. Motor: Good strength throughout subjectively per patient Sensory: Sensory testing is intact to soft touch on all 4 extremities subjectively per patient Coordination: Cerebellar testing reveals good finger-nose-finger  Gait and station: Patient is able to stand from a seated position. gait is normal.  Reflexes: UTA  DIAGNOSTIC DATA (LABS, IMAGING, TESTING) - I reviewed patient records, labs, notes, testing and imaging myself where available.  Lab Results  Component Value Date   WBC 11.5 (H) 06/10/2019   HGB 10.9 (L) 06/10/2019   HCT 32.8 (L) 06/10/2019   MCV 97.3 06/10/2019   PLT 166 06/10/2019      Component Value Date/Time   NA 137 05/11/2017 1844   K 4.0 05/11/2017 1844   CL 102 05/11/2017 1844   CO2 26 05/11/2017 1844   GLUCOSE 97 05/11/2017 1844   BUN 11 05/11/2017 1844   CREATININE 0.76 05/11/2017 1844   CALCIUM 8.7 (L) 05/11/2017 1844   GFRNONAA >60 05/11/2017 1844   GFRAA >60 05/11/2017 1844    Lab Results  Component Value Date   TSH 3.71 12/23/2015      ASSESSMENT AND PLAN 30 y.o. year old female  has a past medical history of Migraines, PROM (premature rupture of membranes) (03/13/2018), and Supervision of normal first pregnancy, antepartum (08/08/2017). here with   1.  Migraine headaches:  --Start nortriptyline 10 mg at bedtime.  Medication  side effects was reviewed with the patient --Start Maxalt 10 mg for abortive therapy advised to take 1 tablet at the onset of a migraine can repeat in 2 hours if needed --Advised that I would put information about both medications on her after visit summary that she can access through her MyChart. --Advised that if her headache frequency or severity does not improve she should let us know --Follow-up in 6 months or sooner if needed   Butch Penny, MSN, NP-C 07/15/2020, 10:59 AM [facility] [address] [place], [place] [address] [phone]

## 2020-07-15 NOTE — Progress Notes (Signed)
I have read the note, and I agree with the clinical assessment and plan.  Ashvin Adelson K Valentino Saavedra   

## 2020-07-18 ENCOUNTER — Ambulatory Visit: Payer: No Typology Code available for payment source | Attending: Obstetrics and Gynecology

## 2020-07-18 ENCOUNTER — Other Ambulatory Visit: Payer: Self-pay

## 2020-07-18 DIAGNOSIS — M62838 Other muscle spasm: Secondary | ICD-10-CM | POA: Diagnosis present

## 2020-07-18 DIAGNOSIS — M533 Sacrococcygeal disorders, not elsewhere classified: Secondary | ICD-10-CM | POA: Diagnosis present

## 2020-07-18 DIAGNOSIS — R293 Abnormal posture: Secondary | ICD-10-CM | POA: Diagnosis present

## 2020-07-18 NOTE — Therapy (Addendum)
[facility] MAIN [facility] SERVICES [address] [place], [place], [address] Phone: [phone]   Fax:  [phone]  Physical Therapy Evaluation  The patient has been informed of current processes in place at Outpatient Rehab to protect patients from Covid-19 exposure including social distancing, schedule modifications, and new cleaning procedures. After discussing their particular risk with a therapist based on the patient's personal risk factors, the patient has decided to proceed with in-person therapy.  Patient Details  Name: Jordan [name] MRN: [id number] Date of Birth: [birth year] No data recorded  Encounter Date: 07/18/2020   PT End of Session - 07/25/20 0903    Visit Number 1    Number of Visits 12    Date for PT Re-Evaluation 10/10/20    Authorization Type UMR    Authorization Time Period 07/18/20 through 10/10/20    Authorization - Visit Number 1    Authorization - Number of Visits 12    Progress Note Due on Visit 10    PT Start Time 1145    PT Stop Time 1230    PT Time Calculation (min) 45 min    Activity Tolerance Patient tolerated treatment well;No increased pain    Behavior During Therapy WFL for tasks assessed/performed           Past Medical History:  Diagnosis Date  . Migraines   . PROM (premature rupture of membranes) 03/13/2018  . Supervision of normal first pregnancy, antepartum 08/08/2017    Nursing Staff Provider Office Location  [facility] Midwife preferred. No students/residents. Dating   LMP and U/S Language  English Anatomy US   normal female, anterior placenta Flu Vaccine  10/19 [facility] Employee Genetic Screen  NIPS: negative  AFP: neg TDaP vaccine   Given 10/8 Hgb A1C or  GTT Early  Third trimester 2 hour GTT wnl Rhogam   NA   LAB RESULTS  Feeding Plan  Breast Blood Type O/Positive/-    Past Surgical History:  Procedure Laterality Date  . WISDOM TOOTH EXTRACTION  May 2012    There were no vitals filed for this  visit.  Pelvic Floor Physical Therapy Evaluation and Assessment  SCREENING  Falls in last 6 mo: none  Red Flags:  Have you had any night sweats? no Unexplained weight loss? no Saddle anesthesia? no Unexplained changes in bowel or bladder habits? no  SUBJECTIVE  Patient reports: She has some incontinence following most recent pregnancy ~ 1 year ago. Just finished breast-feeding. Felt like she had prolapse but was reassured at recent OBG appointment that she does not, it is just vaginal tissue. She still feels heaviness/dullness int the pelvis that comes and goes. It feels worse when she has worked or been on her feet for a while.  Precautions:  Migraines  Social/Family/Vocational History:   NICU nurse., full time   Recent Procedures/Tests/Findings:  none  Obstetrical History: G2P2, both vaginal had significant tearing both anteriorly and posteriorly with first delivery.   Gynecological History: none  Urinary History: First noticed leakage after the second baby ~ 6 weeks PP. It has gotten somewhat better but then she started feeling like she was feeling her cervix. Mixed UI  Gastrointestinal History: None   Sexual activity/pain: none  Location of pain: occasional LBP Current pain:  0/10  Max pain:  5/10 Least pain:  0/10 Nature of pain: achy  Patient Goals: To feel "normal" and not have leakage.    OBJECTIVE  Posture/Observations:  Sitting: Feet do not reach floor,  posterior pelvic tilt. Standing: R shoulder low, 4'10", R ASIS low, slight anterior pelvic tilt, R LE ER'ed, L PSIS high, R>L OI and glute max, and L>R glute MED   Supine: L ASIS low, R iliac crest high  Palpation/Segmental Motion/Joint Play: TTP to B hip-flexors (R>L), to B rectus abdominus and QL. Pain at sacral base, through L3-5 and T4-10 as well as C2. C2 deviated L.   Special tests:   Supine-to-long-sit: L low in both.  Scoliosis: negative  Range of Motion/Flexibilty:  Spine: WNL for  SB and ROT, ~ 2 in. From floor with forward bend, R knee slightly bent.  Hips:   Strength/MMT: deferred to follow-up LE MMT  LE MMT Left Right  Hip flex:  (L2) /5 /5  Hip ext: /5 /5  Hip abd: /5 /5  Hip add: /5 /5  Hip IR /5 /5  Hip ER /5 /5     Abdominal:  Palpation: TTP to rectus femoris at origin Diastasis: not assessed  Pelvic Floor External Exam: Deferred to follow-up Introitus Appears:  Skin integrity:  Palpation: Cough: Prolapse visible?: Scar mobility:  Internal Vaginal Exam: Strength (PERF):  Symmetry: Palpation: Prolapse:   Internal Rectal Exam: Strength (PERF): Symmetry: Palpation: Prolapse:   Gait Analysis: L ASIS low, R iliac crest high   Pelvic Floor Outcome Measures: FOTO PFDI Pain:25, Urinary Problem: 54, PFDI Urinary: 8  INTERVENTIONS THIS SESSION: Self-care: Educated on the structure and function of the pelvic floor in relation to their symptoms as well as the POC, and initial HEP in order to set patient expectations and understanding from which we will build on in the future sessions. Educated on diaphragmatic breathing's role in her care and to begin practicing before next session.  Total time: 45 min.                   Objective measurements completed on examination: See above findings.                  PT Short Term Goals - 07/18/20 1545      PT SHORT TERM GOAL #1   Title Pt. will demonstrate improved pelvic alignment in order to decrease pressure on nerve roots and allow for decreased spasm and pain as well as improved function of downstream tissues.    Baseline R up-slip and L anterior innominate rotation.    Time 6    Period Weeks    Status New    Target Date 08/29/20      PT SHORT TERM GOAL #2   Title Pt. will independently demonstrate HEP x1 in office to facilitate proper HEP performance and maximize outcomes.    Baseline Pt. lacks knowledge of which therapeutic exercises will help decrease  Sx.    Time 6    Period Weeks    Status New    Target Date 08/29/20             PT Long Term Goals - 07/18/20 1551      PT LONG TERM GOAL #1   Title Pt. will demonstrate no UUI or SUI over the course of the prior 2 weeks to demonstrate decreased Sx. and improved function.    Baseline Pt. having both UUI and SUI daily (if sneezes).    Time 12    Period Weeks    Status New    Target Date 10/10/20      PT LONG TERM GOAL #2   Title Pt. will demonstrate no greater than  2/10 LBP with a full shift at work to demonstrate decreased spasm and improved activity tolerance.    Baseline LBP is ~ 5/10 following a work day of if on her feet a lot.    Time 12    Period Weeks    Status New    Target Date 10/10/20      PT LONG TERM GOAL #3   Title Pt. will demonstrate a 10 point improvement in each category (or maximal score) to demonstrate improved Function.    Baseline FOTO PFDI Pain:25,  Urinary Problem: 54,  PFDI Urinary: 8    Time 12    Period Weeks    Status New    Target Date 10/10/20                  Plan - 07/25/20 0904    Clinical Impression Statement Pt. is a 30 y/o female who presents today with cheif c/o mixed UI and mild LBP 1 year postpartum. Her relevant PMH includes chronic migraines and 2 vaginal deliveries, 1 with significant tearing. Her clinical exam revealed hyperlordosis/kyphosis, a R up-slip and L anterior rotation with spasms surrounding B hips as well as low muscle tone. She will benefit from skilled pelvic health PT o address the noted deficits and to continue to assess for and address any other potential causes of Sx.    Personal Factors and Comorbidities Comorbidity 1    Comorbidities migraines    Examination-Activity Limitations Continence    Examination-Participation Restrictions Occupation    Stability/Clinical Decision Making Stable/Uncomplicated    Rehab Potential Excellent    PT Frequency 1x / week    PT Duration 12 weeks    PT  Treatment/Interventions ADLs/Self Care Home Management;Biofeedback;Electrical Stimulation;Functional mobility training;Therapeutic exercise;Therapeutic activities;Neuromuscular re-education;Patient/family education;Orthotic Fit/Training;Manual techniques;Scar mobilization;Dry needling;Spinal Manipulations;Joint Manipulations    PT Next Visit Plan correct for R up-slip/ L anterior rotation an re-measure LLD.    PT Home Exercise Plan diaphragmatic breathing    Consulted and Agree with Plan of Care Patient           Patient will benefit from skilled therapeutic intervention in order to improve the following deficits and impairments:  Decreased activity tolerance,Decreased strength,Decreased scar mobility,Increased muscle spasms,Pain,Postural dysfunction  Visit Diagnosis: Abnormal posture - Plan: PT plan of care cert/re-cert  Sacrococcygeal disorders, not elsewhere classified - Plan: PT plan of care cert/re-cert  Other muscle spasm - Plan: PT plan of care cert/re-cert     Problem List Patient Active Problem List   Diagnosis Date Noted  . Ophthalmoplegic migraine, not intractable 10/08/2016  . B12 deficiency anemia 11/12/2011  . Vitamin D deficiency 11/12/2011   [name] DPT, ATC [name] 07/25/2020, 9:04 AM  [facility] MAIN [facility] SERVICES [address] [place], [place], [address] Phone: [phone]   Fax:  [phone]  Name: JAMILYN PIGEON MRN: [id number] Date of Birth: Feb 29, 1991

## 2020-07-25 ENCOUNTER — Ambulatory Visit: Payer: No Typology Code available for payment source

## 2020-07-25 ENCOUNTER — Other Ambulatory Visit: Payer: Self-pay

## 2020-07-25 DIAGNOSIS — M533 Sacrococcygeal disorders, not elsewhere classified: Secondary | ICD-10-CM

## 2020-07-25 DIAGNOSIS — R293 Abnormal posture: Secondary | ICD-10-CM

## 2020-07-25 DIAGNOSIS — M62838 Other muscle spasm: Secondary | ICD-10-CM

## 2020-07-25 NOTE — Patient Instructions (Signed)
Flexors, Lunge  Hip Flexor Stretch: Proposal Pose    Maintain pelvic tuck under, lift pubic bone toward navel. Engage posterior hip muscles (firm glute muscles of leg in back position) and shift forward until you feel stretch on front of leg that is down. To increase stretch, maintain balance and ease hips forward. You may use one hand on a chair for balance if needed. Hold for __5__ breaths. Repeat __2-3__ times each leg.  Do _1-2__ times per day.    Hold for 30 seconds (5 deep breaths) and repeat 2-3 times on each side once a day

## 2020-07-25 NOTE — Therapy (Signed)
[name] MAIN [facility] SERVICES [address] [name], [place], [address] Phone: [phone]   Fax:  [phone]  Physical Therapy Treatment  The patient has been informed of current processes in place at Outpatient Rehab to protect patients from Covid-19 exposure including social distancing, schedule modifications, and new cleaning procedures. After discussing their particular risk with a therapist based on the patient's personal risk factors, the patient has decided to proceed with in-person therapy.  Patient Details  Name: Jordan [name] MRN: [id number] Date of Birth: [birth year] No data recorded  Encounter Date: 07/25/2020   PT End of Session - 07/25/20 1420    Visit Number 2    Number of Visits 12    Date for PT Re-Evaluation 10/10/20    Authorization Type UMR    Authorization Time Period 07/18/20 through 10/10/20    Authorization - Visit Number 2    Authorization - Number of Visits 12    Progress Note Due on Visit 10    PT Start Time 0930    PT Stop Time 1030    PT Time Calculation (min) 60 min    Activity Tolerance Patient tolerated treatment well;No increased pain    Behavior During Therapy WFL for tasks assessed/performed           Past Medical History:  Diagnosis Date  . Migraines   . PROM (premature rupture of membranes) 03/13/2018  . Supervision of normal first pregnancy, antepartum 08/08/2017    Nursing Staff Provider Office Location  [facility] Midwife preferred. No students/residents. Dating   LMP and U/S Language  English Anatomy US   normal female, anterior placenta Flu Vaccine  10/19 [employer] Employee Genetic Screen  NIPS: negative  AFP: neg TDaP vaccine   Given 10/8 Hgb A1C or  GTT Early  Third trimester 2 hour GTT wnl Rhogam   NA   LAB RESULTS  Feeding Plan  Breast Blood Type O/Positive/-    Past Surgical History:  Procedure Laterality Date  . WISDOM TOOTH EXTRACTION  May 2012    There were no vitals filed for this visit.     Pelvic Floor Physical Therapy Treatment Note  SCREENING  Changes in medications, allergies, or medical history?: none    SUBJECTIVE  Patient reports: No changes since prior visit.  Precautions:  Migraines  Pain update:  Location of pain: occasional LBP Current pain: 0/10  Max pain: 5/10 Least pain: 0/10 Nature of pain:achy  Patient Goals: To feel "normal" and not have leakage.    OBJECTIVE  Changes in: Posture/Observations:  R up-slip and L anterior rotation  Range of Motion/Flexibilty:    Strength/MMT:  LE MMT:  Pelvic floor:  Abdominal:   Palpation: TTP to B iliacus and R QL  Gait Analysis:  INTERVENTIONS THIS SESSION: Manual: Performed TP release to B iliacus and R QL to decrease spasm and pain and allow for improved balance of musculature for improved function and decreased symptoms.  Therex: Educated on and practiced hip-flexor and side-stretches To maintain and improve muscle length and allow for improved balance of musculature for long-term symptom relief.  Total time: 60 min.                              PT Short Term Goals - 07/18/20 1545      PT SHORT TERM GOAL #1   Title Pt. will demonstrate improved pelvic alignment in order to decrease pressure on nerve  roots and allow for decreased spasm and pain as well as improved function of downstream tissues.    Baseline R up-slip and L anterior innominate rotation.    Time 6    Period Weeks    Status New    Target Date 08/29/20      PT SHORT TERM GOAL #2   Title Pt. will independently demonstrate HEP x1 in office to facilitate proper HEP performance and maximize outcomes.    Baseline Pt. lacks knowledge of which therapeutic exercises will help decrease Sx.    Time 6    Period Weeks    Status New    Target Date 08/29/20             PT Long Term Goals - 07/18/20 1551      PT LONG TERM GOAL #1   Title Pt. will demonstrate no UUI or SUI over the course of  the prior 2 weeks to demonstrate decreased Sx. and improved function.    Baseline Pt. having both UUI and SUI daily (if sneezes).    Time 12    Period Weeks    Status New    Target Date 10/10/20      PT LONG TERM GOAL #2   Title Pt. will demonstrate no greater than 2/10 LBP with a full shift at work to demonstrate decreased spasm and improved activity tolerance.    Baseline LBP is ~ 5/10 following a work day of if on her feet a lot.    Time 12    Period Weeks    Status New    Target Date 10/10/20      PT LONG TERM GOAL #3   Title Pt. will demonstrate a 10 point improvement in each category (or maximal score) to demonstrate improved Function.    Baseline FOTO PFDI Pain:25,  Urinary Problem: 54,  PFDI Urinary: 8    Time 12    Period Weeks    Status New    Target Date 10/10/20                 Plan - 07/25/20 1420    Clinical Impression Statement Pt. Responded well to all interventions today, demonstrating improved pelvic alignment and decreased spasm as well as understanding and correct performance of all education and exercises provided today. They will continue to benefit from skilled physical therapy to work toward remaining goals and maximize function as well as decrease likelihood of symptom increase or recurrence.    PT Next Visit Plan Educate on deep-core recruitment and hip strengthening. assess PFM when appropriate.    PT Home Exercise Plan diaphragmatic breathing, hip-flexor stretch, side-stretch    Consulted and Agree with Plan of Care Patient           Patient will benefit from skilled therapeutic intervention in order to improve the following deficits and impairments:     Visit Diagnosis: Abnormal posture  Sacrococcygeal disorders, not elsewhere classified  Other muscle spasm     Problem List Patient Active Problem List   Diagnosis Date Noted  . Ophthalmoplegic migraine, not intractable 10/08/2016  . B12 deficiency anemia 11/12/2011  . Vitamin D  deficiency 11/12/2011   Jordan [name] DPT, ATC Jordan [name] 07/25/2020, 2:25 PM  [facility] MAIN [facility] SERVICES [address] [place], [place], [address] Phone: [phone]   Fax:  [phone]  Name: Jordan [name] MRN: [id number] Date of Birth: [birth year]

## 2020-08-01 ENCOUNTER — Other Ambulatory Visit: Payer: Self-pay

## 2020-08-01 ENCOUNTER — Ambulatory Visit: Payer: No Typology Code available for payment source

## 2020-08-01 DIAGNOSIS — M533 Sacrococcygeal disorders, not elsewhere classified: Secondary | ICD-10-CM

## 2020-08-01 DIAGNOSIS — R293 Abnormal posture: Secondary | ICD-10-CM | POA: Diagnosis not present

## 2020-08-01 DIAGNOSIS — M62838 Other muscle spasm: Secondary | ICD-10-CM

## 2020-08-01 NOTE — Patient Instructions (Signed)
  Your "usual posture" looks like the sway-back picture above, we want it to look more like the first picture labeled "normal" Where the earlobe, tip of the shoulder, hip, and knee are all in a straight line.  Make sure that your knees are un-locked but not bent.  Draw up as if an imaginary string were tied to the crown of your head stretching you up tall. Gently pull shoulders back and down without "pushing" chest out. Pull chin in slightly as if you were a turtle pulling into your shell until ears line up with tips of shoulders.   When seated, you want to maintain pelvic neutral with the shoulders gently down and back and ears in line with your shoulders. A lumbar roll such as the one below or a home-made towel-roll can be used for this purpose. Even Olympic athletes can only maintain proper seated posture for about 10 minutes without support!    Pictured: The Original [employer] Lumbar Roll   Pelvic Tilt With Pelvic Floor (Hook-Lying)        Lie with hips and knees bent. Squeeze pelvic floor and flatten low back while breathing out so that pelvis tilts. Repeat _2x15__ times. Do _1-2__ times a day.  Put a pillow under your hips in this position when doing this exercise to help decrease pressure in the pelvis when it feels "heavy".    Start on all fours, tuck your hips under slightly and come forward until you are in a straight line. Make sure that your knees, your hips, and your shoulders are in a straight line. Hold for ~ 30 seconds, (or until you feel like you are too tired to hold it correctly). Repeat 2-3 times (or up to ~ 1:30 sec. Total).   As you get stronger, you can lengthen each hold. When you can hold for ~ 1:30 straight, try switching to on your toes instead of knees. You will need to decrease your hold time initially and build back up to the full 1:30.

## 2020-08-01 NOTE — Therapy (Signed)
[facility] MAIN [facility] SERVICES [address] [place], [place], [address] Phone: [phone]   Fax:  [phone]  Physical Therapy Treatment  The patient has been informed of current processes in place at Outpatient Rehab to protect patients from Covid-19 exposure including social distancing, schedule modifications, and new cleaning procedures. After discussing their particular risk with a therapist based on the patient's personal risk factors, the patient has decided to proceed with in-person therapy.  Patient Details  Name: Jordan [name] MRN: [id number] Date of Birth: [birth year] No data recorded  Encounter Date: 08/01/2020   PT End of Session - 08/01/20 1142    Visit Number 3    Number of Visits 12    Date for PT Re-Evaluation 10/10/20    Authorization Type UMR    Authorization Time Period 07/18/20 through 10/10/20    Authorization - Visit Number 3    Authorization - Number of Visits 12    Progress Note Due on Visit 10    PT Start Time 1030    PT Stop Time 1130    PT Time Calculation (min) 60 min    Activity Tolerance Patient tolerated treatment well;No increased pain    Behavior During Therapy WFL for tasks assessed/performed           Past Medical History:  Diagnosis Date  . Migraines   . PROM (premature rupture of membranes) 03/13/2018  . Supervision of normal first pregnancy, antepartum 08/08/2017    Nursing Staff Provider Office Location  [facility] Midwife preferred. No students/residents. Dating   LMP and U/S Language  English Anatomy US   normal female, anterior placenta Flu Vaccine  10/19 [facility] Employee Genetic Screen  NIPS: negative  AFP: neg TDaP vaccine   Given 10/8 Hgb A1C or  GTT Early  Third trimester 2 hour GTT wnl Rhogam   NA   LAB RESULTS  Feeding Plan  Breast Blood Type O/Positive/-    Past Surgical History:  Procedure Laterality Date  . WISDOM TOOTH EXTRACTION  May 2012    There were no vitals filed for this visit.     Pelvic Floor Physical Therapy Treatment Note  SCREENING  Changes in medications, allergies, or medical history?: none    SUBJECTIVE  Patient reports: She had some pain after work 2 days that was not as bad but still there. She couldn't tell whether she had leakage this week because she was on her period. She has not noticed as much urgency leakage. She has had migraines this past week, thinks they are connected to her periods, it was pretty rough this past week.   Precautions:  Migraines  Pain update:  Location of pain: occasional LBP Current pain: 0/10  Max pain: 4/10 Least pain: 0/10 Nature of pain:achy  ** no increased pain following treatment.  Patient Goals: To feel "normal" and not have leakage.    OBJECTIVE  Changes in: Posture/Observations:  07/25/20: R up-slip and L anterior rotation  TODAY: ASIS and PSIS appear level in standing and supine.  Range of Motion/Flexibilty:    Strength/MMT:  LE MMT:  Pelvic floor:  Abdominal:  Pt. Has difficulty recruiting TA, obliques are dominant.   **following TP release to extensors, TA recruitment improved but will require some review.   Palpation: 07/25/20: TTP to B iliacus and R QL  TODAY: TTP to B multifidus and erector spinae from T11-L5 and R adductor brevis.  Gait Analysis:  INTERVENTIONS THIS SESSION: Manual: Performed TP release to B  multifidus and erector spinae from T11-L5 and R adductor brevis to decrease spasm and pain and allow for improved balance of musculature for improved function and decreased symptoms.  Therex: Educated on and practiced posterior pelvic tilts in hook-lying and discussed standing and sitting posture to improve strength of muscles opposing tight musculature to allow reciprocal inhibition to improve balance of musculature surrounding the pelvis and improve overall posture for optimal musculature length-tension relationship and function.  Total time: 60  min.                                  PT Short Term Goals - 07/18/20 1545      PT SHORT TERM GOAL #1   Title Pt. will demonstrate improved pelvic alignment in order to decrease pressure on nerve roots and allow for decreased spasm and pain as well as improved function of downstream tissues.    Baseline R up-slip and L anterior innominate rotation.    Time 6    Period Weeks    Status New    Target Date 08/29/20      PT SHORT TERM GOAL #2   Title Pt. will independently demonstrate HEP x1 in office to facilitate proper HEP performance and maximize outcomes.    Baseline Pt. lacks knowledge of which therapeutic exercises will help decrease Sx.    Time 6    Period Weeks    Status New    Target Date 08/29/20             PT Long Term Goals - 07/18/20 1551      PT LONG TERM GOAL #1   Title Pt. will demonstrate no UUI or SUI over the course of the prior 2 weeks to demonstrate decreased Sx. and improved function.    Baseline Pt. having both UUI and SUI daily (if sneezes).    Time 12    Period Weeks    Status New    Target Date 10/10/20      PT LONG TERM GOAL #2   Title Pt. will demonstrate no greater than 2/10 LBP with a full shift at work to demonstrate decreased spasm and improved activity tolerance.    Baseline LBP is ~ 5/10 following a work day of if on her feet a lot.    Time 12    Period Weeks    Status New    Target Date 10/10/20      PT LONG TERM GOAL #3   Title Pt. will demonstrate a 10 point improvement in each category (or maximal score) to demonstrate improved Function.    Baseline FOTO PFDI Pain:25,  Urinary Problem: 54,  PFDI Urinary: 8    Time 12    Period Weeks    Status New    Target Date 10/10/20                 Plan - 08/01/20 1144    Clinical Impression Statement Pt. Responded well to all interventions today, demonstrating decreased spasm and TTP, improved deep-core recruitment and coordination, as well as  understanding and correct performance of all education and exercises provided today. They will continue to benefit from skilled physical therapy to work toward remaining goals and maximize function as well as decrease likelihood of symptom increase or recurrence.    PT Next Visit Plan Educate on hip strengthening. assess PFM when appropriate.    PT Home Exercise Plan diaphragmatic breathing, hip-flexor  stretch, side-stretch, posterior pelvic tilt in hook-lying, knee planks.    Consulted and Agree with Plan of Care Patient           Patient will benefit from skilled therapeutic intervention in order to improve the following deficits and impairments:     Visit Diagnosis: Abnormal posture  Sacrococcygeal disorders, not elsewhere classified  Other muscle spasm     Problem List Patient Active Problem List   Diagnosis Date Noted  . Ophthalmoplegic migraine, not intractable 10/08/2016  . B12 deficiency anemia 11/12/2011  . Vitamin D deficiency 11/12/2011   [name] DPT, ATC [name] 08/01/2020, 11:45 AM  [facility] MAIN [facility] SERVICES [address] [place], [place], [address] Phone: [phone]   Fax:  [phone]  Name: ANNALESE STINER MRN: [id number] Date of Birth: [birth year]

## 2020-08-08 ENCOUNTER — Ambulatory Visit: Payer: No Typology Code available for payment source | Attending: Obstetrics and Gynecology

## 2020-08-08 ENCOUNTER — Other Ambulatory Visit: Payer: Self-pay

## 2020-08-08 DIAGNOSIS — R293 Abnormal posture: Secondary | ICD-10-CM | POA: Diagnosis present

## 2020-08-08 DIAGNOSIS — M533 Sacrococcygeal disorders, not elsewhere classified: Secondary | ICD-10-CM | POA: Diagnosis present

## 2020-08-08 DIAGNOSIS — M62838 Other muscle spasm: Secondary | ICD-10-CM | POA: Insufficient documentation

## 2020-08-08 NOTE — Patient Instructions (Addendum)
"  Pre-squeeze and sneeze"  Before you cough, sneeze, laugh etc. "pre-squeeze" the pelvic floor (kegel) and hold it until you finish coughing to retrain the muscles to hold the urine in during these activities.   Urge supression technique:  1) Take a deep breath to convince yourself that you are in control and calm the nervous system.  2) Do 5 "quick-flick" kegels (pelvic floor muscle contractions) and re-assess the urge. Repeat another set if urge is still present. 3) Once the urge has decreased, start walking calmly to the the bathroom. Stop and repeat steps 1 and 2 as many times as needed until you can successfully get to the toilet. 4) Only once seated, take a deep breath and allow the pelvic floor muscles to relax and allow for the urine to flow.    Do not be discouraged if you are not successful the first couple times, this is normal and it will take practice but remember that YOU are in control. Start by practicing this at home where you do not have to worry as much if there were to be an accident. Allowing yourself to get rushed or nervous puts the bladder back in control and will not allow the technique to work.

## 2020-08-08 NOTE — Therapy (Signed)
[facility] MAIN [facility] SERVICES [address] [place], [place], [address] Phone: [phone]   Fax:  [phone]  Physical Therapy Treatment  The patient has been informed of current processes in place at Outpatient Rehab to protect patients from Covid-19 exposure including social distancing, schedule modifications, and new cleaning procedures. After discussing their particular risk with a therapist based on the patient's personal risk factors, the patient has decided to proceed with in-person therapy.  Patient Details  Name: Jordan [name] MRN: [id number] Date of Birth: September 25, 1990 No data recorded  Encounter Date: 08/08/2020   PT End of Session - 08/08/20 1150    Visit Number 4    Number of Visits 12    Date for PT Re-Evaluation 10/10/20    Authorization Type UMR    Authorization Time Period 07/18/20 through 10/10/20    Authorization - Visit Number 4    Authorization - Number of Visits 12    Progress Note Due on Visit 10    PT Start Time 1030    PT Stop Time 1130    PT Time Calculation (min) 60 min    Activity Tolerance Patient tolerated treatment well;No increased pain    Behavior During Therapy WFL for tasks assessed/performed           Past Medical History:  Diagnosis Date  . Migraines   . PROM (premature rupture of membranes) 03/13/2018  . Supervision of normal first pregnancy, antepartum 08/08/2017    Nursing Staff Provider Office Location  [facility] Midwife preferred. No students/residents. Dating   LMP and U/S Language  English Anatomy US   normal female, anterior placenta Flu Vaccine  10/19 [facility] Employee Genetic Screen  NIPS: negative  AFP: neg TDaP vaccine   Given 10/8 Hgb A1C or  GTT Early  Third trimester 2 hour GTT wnl Rhogam   NA   LAB RESULTS  Feeding Plan  Breast Blood Type O/Positive/-    Past Surgical History:  Procedure Laterality Date  . WISDOM TOOTH EXTRACTION  May 2012    There were no vitals filed for this  visit.  Pelvic Floor Physical Therapy Treatment Note  SCREENING  Changes in medications, allergies, or medical history?: none    SUBJECTIVE  Patient reports: She had a rough weekend last week, was on her feet a lot, LBP was elevated but went away overnight. She still had headaches ~ 3-4 times over the week. She is having heaviness still when on her feet for a long time.  Precautions:  Migraines  Pain update:  Location of pain: occasional LBP Current pain: 0/10  Max pain: 6/10 Least pain: 0/10 Nature of pain:achy  ** no increased pain following treatment.  Patient Goals: To feel "normal" and not have leakage.    OBJECTIVE  Changes in: Posture/Observations:  07/25/20: R up-slip and L anterior rotation   08/01/20: ASIS and PSIS appear level in standing and supine.  Range of Motion/Flexibilty:    Strength/MMT:  LE MMT:   Pelvic Floor External Exam: Introitus Appears: gaping Skin integrity: WNL Palpation: TTP to L STP Cough: parodoxical Prolapse visible?: no Scar mobility: decreased  Internal Vaginal Exam: Strength (PERF): 2/5, 7 sec Symmetry: posterior> anterior for TTP Palpation: TTP through all except R anterior PR/PC. Prolapse: anterior wall visible at the level of the introitus with bearing down.  Abdominal:  08/01/20: Pt. Has difficulty recruiting TA, obliques are dominant.  **following TP release to extensors, TA recruitment improved but will require some review.  TODAY: Pt. Able to recruit TA well with MIN TC, VC. Able to progress to mini-marches for 2 reps but should keep practicing tilts at home.   Palpation: 07/25/20: TTP to B iliacus and R QL  08/01/20: TTP to B multifidus and erector spinae from T11-L5 and R adductor brevis.  TODAY: mild TTP to R adductor brevis  Gait Analysis:  INTERVENTIONS THIS SESSION: Manual: Assessed PFm and performed TP release to anterior and posterior PR/PC, OI and coccygeus on R and L posterior PR/PC and OI  to decrease spasm and pain and allow for improved balance of musculature for improved function and decreased symptoms.  Therex: Reviewed and practiced posterior pelvic tilts with TC for TA and educated on progression to Mini-marches when this becomes too easy. Educated on "pre-squeeze and sneeze" and urge suppression techniques to help decreased UI. Given verbal cues to improve PFM recruitment for use with urge suppression only.  Total time: 60 min.                              PT Short Term Goals - 07/18/20 1545      PT SHORT TERM GOAL #1   Title Pt. will demonstrate improved pelvic alignment in order to decrease pressure on nerve roots and allow for decreased spasm and pain as well as improved function of downstream tissues.    Baseline R up-slip and L anterior innominate rotation.    Time 6    Period Weeks    Status New    Target Date 08/29/20      PT SHORT TERM GOAL #2   Title Pt. will independently demonstrate HEP x1 in office to facilitate proper HEP performance and maximize outcomes.    Baseline Pt. lacks knowledge of which therapeutic exercises will help decrease Sx.    Time 6    Period Weeks    Status New    Target Date 08/29/20             PT Long Term Goals - 07/18/20 1551      PT LONG TERM GOAL #1   Title Pt. will demonstrate no UUI or SUI over the course of the prior 2 weeks to demonstrate decreased Sx. and improved function.    Baseline Pt. having both UUI and SUI daily (if sneezes).    Time 12    Period Weeks    Status New    Target Date 10/10/20      PT LONG TERM GOAL #2   Title Pt. will demonstrate no greater than 2/10 LBP with a full shift at work to demonstrate decreased spasm and improved activity tolerance.    Baseline LBP is ~ 5/10 following a work day of if on her feet a lot.    Time 12    Period Weeks    Status New    Target Date 10/10/20      PT LONG TERM GOAL #3   Title Pt. will demonstrate a 10 point improvement in  each category (or maximal score) to demonstrate improved Function.    Baseline FOTO PFDI Pain:25,  Urinary Problem: 54,  PFDI Urinary: 8    Time 12    Period Weeks    Status New    Target Date 10/10/20                 Plan - 08/08/20 1150    Clinical Impression Statement Pt. Responded well to all interventions  today, demonstrating improved TA recruitment and understanding of the posterior pelvic tilts, decreased PFM spasm and TTP, as well as understanding and correct performance of all education and exercises provided today. They will continue to benefit from skilled physical therapy to work toward remaining goals and maximize function as well as decrease likelihood of symptom increase or recurrence.    PT Next Visit Plan Re-assess PFM and release L PR/PC and coccygeus and STP. review strengthening/give kegels as HEP Educate on hip strengthening.    PT Home Exercise Plan diaphragmatic breathing, hip-flexor stretch, side-stretch, posterior pelvic tilt in hook-lying, knee planks.    Consulted and Agree with Plan of Care Patient           Patient will benefit from skilled therapeutic intervention in order to improve the following deficits and impairments:     Visit Diagnosis: Abnormal posture  Sacrococcygeal disorders, not elsewhere classified  Other muscle spasm     Problem List Patient Active Problem List   Diagnosis Date Noted  . Ophthalmoplegic migraine, not intractable 10/08/2016  . B12 deficiency anemia 11/12/2011  . Vitamin D deficiency 11/12/2011   [name] DPT, ATC [name] 08/08/2020, 12:03 PM  [place] [facility] MAIN [facility] SERVICES [address] [place], [place], [address] Phone: [phone]   Fax:  [phone]  Name: Jordan [name] MRN: [id number] Date of Birth: 1990/07/13

## 2020-08-15 ENCOUNTER — Ambulatory Visit: Payer: No Typology Code available for payment source

## 2020-08-15 ENCOUNTER — Other Ambulatory Visit: Payer: Self-pay

## 2020-08-15 DIAGNOSIS — M533 Sacrococcygeal disorders, not elsewhere classified: Secondary | ICD-10-CM

## 2020-08-15 DIAGNOSIS — R293 Abnormal posture: Secondary | ICD-10-CM

## 2020-08-15 DIAGNOSIS — M62838 Other muscle spasm: Secondary | ICD-10-CM

## 2020-08-15 NOTE — Patient Instructions (Signed)
Kegel exercises:    With neutral spine, tighten pelvic floor by imagining you are stopping the flow of urine, squeezing only around the vagina and anus.  Quick-Flicks: Pull up and in quickly and then relax allowing just enough time for the muscles to full lengthen before the next contraction. Do _10__ repetitions in a row, stopping if the pelvic floor muscle gets tired and other muscles try to take over.  Long-Holds: Hold for _10__ seconds and then fully release, repeat _5__ times. (only hold until you can feel the muscles "give up")  Repeat both of these exercises _3-5__ times throughout the day     If this is difficult, start by performing while lying down as shown. As you get stronger, you can try in sitting, standing, etc. Make sure that you are NOT contracting your buttocks or inner thigh muscles!  Tip: If you are having a hard time feeling the muscles, try using a hand-held mirror to observe the muscles lift and draw in or feel with your hand to make sure you are squeezing when you think you are squeezing, not bearing down. Some people find it helpful to pretend you are "picking blueberries" with the vagina to feel the drawing in or try to make the clitoris "nod" and pull your sitz bones in toward the middle.

## 2020-08-15 NOTE — Therapy (Signed)
[place] [facility] MAIN [facility] SERVICES [address] [place], [place], [address] Phone: [phone]   Fax:  [phone]  Physical Therapy Treatment  The patient has been informed of current processes in place at Outpatient Rehab to protect patients from Covid-19 exposure including social distancing, schedule modifications, and new cleaning procedures. After discussing their particular risk with a therapist based on the patient's personal risk factors, the patient has decided to proceed with in-person therapy.  Patient Details  Name: Jordan [name] MRN: [id number] Date of Birth: Jul 03, 1990 No data recorded  Encounter Date: 08/15/2020   PT End of Session - 08/22/20 1019    Visit Number 5    Number of Visits 12    Date for PT Re-Evaluation 10/10/20    Authorization Type UMR    Authorization Time Period 07/18/20 through 10/10/20    Authorization - Visit Number 5    Authorization - Number of Visits 12    Progress Note Due on Visit 10    PT Start Time 1130    PT Stop Time 1230    PT Time Calculation (min) 60 min    Activity Tolerance Patient tolerated treatment well;No increased pain    Behavior During Therapy WFL for tasks assessed/performed           Past Medical History:  Diagnosis Date  . Migraines   . PROM (premature rupture of membranes) 03/13/2018  . Supervision of normal first pregnancy, antepartum 08/08/2017    Nursing Staff Provider Office Location  [facility] Midwife preferred. No students/residents. Dating   LMP and U/S Language  English Anatomy US   normal female, anterior placenta Flu Vaccine  10/19 [facility] Employee Genetic Screen  NIPS: negative  AFP: neg TDaP vaccine   Given 10/8 Hgb A1C or  GTT Early  Third trimester 2 hour GTT wnl Rhogam   NA   LAB RESULTS  Feeding Plan  Breast Blood Type O/Positive/-    Past Surgical History:  Procedure Laterality Date  . WISDOM TOOTH EXTRACTION  May 2012    There were no vitals filed for this  visit.   Pelvic Floor Physical Therapy Treatment Note  SCREENING  Changes in medications, allergies, or medical history?: none    SUBJECTIVE  Patient reports: Things are going well. She has had to use the urge suppression technique a couple times but it is helping. Pain-wise she is doing well has not had as much stress at work. She may have had leakage once with urge and none with stress.   Precautions:  Migraines  Pain update:  Location of pain: occasional LBP Current pain: 0/10  Max pain: 5/10 Least pain: 0/10 Nature of pain:achy  ** no increased pain following treatment.  Patient Goals: To feel "normal" and not have leakage.    OBJECTIVE  Changes in: Posture/Observations:  07/25/20: R up-slip and L anterior rotation   08/01/20: ASIS and PSIS appear level in standing and supine.  Range of Motion/Flexibilty:    Strength/MMT:  LE MMT:   Pelvic Floor External Exam: Introitus Appears: gaping Skin integrity: WNL Palpation: TTP to L STP Cough: parodoxical Prolapse visible?: no Scar mobility: decreased  Internal Vaginal Exam: Strength (PERF): 2/5, 7 sec Symmetry: posterior> anterior for TTP Palpation: TTP through all except R anterior PR/PC. Prolapse: anterior wall visible at the level of the introitus with bearing down.  TODAY: TTP to B OI, coccygeus, and PR/PC with scar tissue near ~ 5 o'clock   Abdominal:  08/01/20: Pt. Has  difficulty recruiting TA, obliques are dominant.  **following TP release to extensors, TA recruitment improved but will require some review.  08/08/20: Pt. Able to recruit TA well with MIN TC, VC. Able to progress to mini-marches for 2 reps but should keep practicing tilts at home.   Palpation: 07/25/20: TTP to B iliacus and R QL  08/01/20: TTP to B multifidus and erector spinae from T11-L5 and R adductor brevis.  TODAY: mild TTP to R adductor brevis  Gait Analysis:  INTERVENTIONS THIS SESSION: Manual: Released through all  PFM and scar tissue on L to decrease spasm and pain and allow for improved balance of musculature for improved function and decreased symptoms.  Therex: Educated on and practiced Kegels with VC to "pick the blueberries" and "nod the clitoris" to improve recruitment and facilitate improved strength for decreased leakage.  Total time: 60 min.                              PT Short Term Goals - 07/18/20 1545      PT SHORT TERM GOAL #1   Title Pt. will demonstrate improved pelvic alignment in order to decrease pressure on nerve roots and allow for decreased spasm and pain as well as improved function of downstream tissues.    Baseline R up-slip and L anterior innominate rotation.    Time 6    Period Weeks    Status New    Target Date 08/29/20      PT SHORT TERM GOAL #2   Title Pt. will independently demonstrate HEP x1 in office to facilitate proper HEP performance and maximize outcomes.    Baseline Pt. lacks knowledge of which therapeutic exercises will help decrease Sx.    Time 6    Period Weeks    Status New    Target Date 08/29/20             PT Long Term Goals - 07/18/20 1551      PT LONG TERM GOAL #1   Title Pt. will demonstrate no UUI or SUI over the course of the prior 2 weeks to demonstrate decreased Sx. and improved function.    Baseline Pt. having both UUI and SUI daily (if sneezes).    Time 12    Period Weeks    Status New    Target Date 10/10/20      PT LONG TERM GOAL #2   Title Pt. will demonstrate no greater than 2/10 LBP with a full shift at work to demonstrate decreased spasm and improved activity tolerance.    Baseline LBP is ~ 5/10 following a work day of if on her feet a lot.    Time 12    Period Weeks    Status New    Target Date 10/10/20      PT LONG TERM GOAL #3   Title Pt. will demonstrate a 10 point improvement in each category (or maximal score) to demonstrate improved Function.    Baseline FOTO PFDI Pain:25,  Urinary  Problem: 54,  PFDI Urinary: 8    Time 12    Period Weeks    Status New    Target Date 10/10/20                 Plan - 08/22/20 1023    Clinical Impression Statement Pt. Responded well to all interventions today, demonstrating improved PFM relaxation and recruitment as well as understanding and correct performance  of all education and exercises provided today. They will continue to benefit from skilled physical therapy to work toward remaining goals and maximize function as well as decrease likelihood of symptom increase or recurrence.   PT Next Visit Plan Re-assess PFM PRN, increase deep-core difficulty and assess hip strength and assign strengthening.    PT Home Exercise Plan diaphragmatic breathing, hip-flexor stretch, side-stretch, posterior pelvic tilt in hook-lying, knee planks, kegels.    Consulted and Agree with Plan of Care Patient           Patient will benefit from skilled therapeutic intervention in order to improve the following deficits and impairments:     Visit Diagnosis: Abnormal posture  Sacrococcygeal disorders, not elsewhere classified  Other muscle spasm     Problem List Patient Active Problem List   Diagnosis Date Noted  . Ophthalmoplegic migraine, not intractable 10/08/2016  . B12 deficiency anemia 11/12/2011  . Vitamin D deficiency 11/12/2011   [name] DPT, ATC [name] 08/22/2020, 10:26 AM  [place] [facility] MAIN [facility] SERVICES [address] [place], [place], [address] Phone: [phone]   Fax:  [phone]  Name: Jordan [name] MRN: [id number] Date of Birth: [birth year]

## 2020-08-20 ENCOUNTER — Encounter: Payer: Self-pay | Admitting: Adult Health

## 2020-08-25 ENCOUNTER — Other Ambulatory Visit (HOSPITAL_COMMUNITY): Payer: Self-pay

## 2020-08-25 ENCOUNTER — Ambulatory Visit: Payer: No Typology Code available for payment source | Admitting: Physical Therapy

## 2020-08-25 MED ORDER — NORTRIPTYLINE HCL 10 MG PO CAPS
20.0000 mg | ORAL_CAPSULE | Freq: Every day | ORAL | 3 refills | Status: DC
  Filled 2020-08-25: qty 180, 90d supply, fill #0

## 2020-08-29 ENCOUNTER — Other Ambulatory Visit: Payer: Self-pay

## 2020-08-29 ENCOUNTER — Ambulatory Visit: Payer: No Typology Code available for payment source

## 2020-08-29 DIAGNOSIS — M62838 Other muscle spasm: Secondary | ICD-10-CM

## 2020-08-29 DIAGNOSIS — R293 Abnormal posture: Secondary | ICD-10-CM | POA: Diagnosis not present

## 2020-08-29 DIAGNOSIS — M533 Sacrococcygeal disorders, not elsewhere classified: Secondary | ICD-10-CM

## 2020-08-29 NOTE — Therapy (Signed)
[facility] MAIN [facility] SERVICES [address] [place], [place], [address] Phone: [phone]   Fax:  [phone]  Physical Therapy Treatment  The patient has been informed of current processes in place at Outpatient Rehab to protect patients from Covid-19 exposure including social distancing, schedule modifications, and new cleaning procedures. After discussing their particular risk with a therapist based on the patient's personal risk factors, the patient has decided to proceed with in-person therapy.  Patient Details  Name: Jordan [name] MRN: [id number] Date of Birth: [birth year] No data recorded  Encounter Date: 08/29/2020   PT End of Session - 08/29/20 1530    Visit Number 6    Number of Visits 12    Date for PT Re-Evaluation 10/10/20    Authorization Type UMR    Authorization Time Period 07/18/20 through 10/10/20    Authorization - Visit Number 6    Authorization - Number of Visits 12    Progress Note Due on Visit 10    PT Start Time 1030    PT Stop Time 1130    PT Time Calculation (min) 60 min    Activity Tolerance Patient tolerated treatment well;No increased pain    Behavior During Therapy WFL for tasks assessed/performed           Past Medical History:  Diagnosis Date  . Migraines   . PROM (premature rupture of membranes) 03/13/2018  . Supervision of normal first pregnancy, antepartum 08/08/2017    Nursing Staff Provider Office Location  [facility] Midwife preferred. No students/residents. Dating   LMP and U/S Language  English Anatomy US   normal female, anterior placenta Flu Vaccine  10/19 [facility] Employee Genetic Screen  NIPS: negative  AFP: neg TDaP vaccine   Given 10/8 Hgb A1C or  GTT Early  Third trimester 2 hour GTT wnl Rhogam   NA   LAB RESULTS  Feeding Plan  Breast Blood Type O/Positive/-    Past Surgical History:  Procedure Laterality Date  . WISDOM TOOTH EXTRACTION  May 2012    There were no vitals filed for this  visit.   Pelvic Floor Physical Therapy Treatment Note  SCREENING  Changes in medications, allergies, or medical history?: none    SUBJECTIVE  Patient reports: She is able to do kegels in sitting and standing but they feel different than when she is lying down. Her back has not been a problem other than a little "twing" since last visit. Still occasionally has a feeling of heaviness if she does not go to the bathroom for 8-9 hours but not noticing it (or barely noticing it) otherwise. She is not drinking much water throughout the day now that she is not breastfeeding. She gets ~ 16 oz. Of water and 1-2 caffinated drinks per day (cheerwine, etc). Has not been needing the urge suppression technique as much.    Precautions:  Migraines  Pain update:  Location of pain: occasional LBP Current pain: 0/10  Max pain: 2/10 (twinge on L) Least pain: 0/10 Nature of pain:achy  ** no increased pain following treatment.  Patient Goals: To feel "normal" and not have leakage.    OBJECTIVE  Changes in: Posture/Observations:  07/25/20: R up-slip and L anterior rotation   08/01/20: ASIS and PSIS appear level in standing and supine.  Range of Motion/Flexibilty:    Strength/MMT:  LE MMT:   Pelvic Floor External Exam: Introitus Appears: gaping Skin integrity: WNL Palpation: TTP to L STP Cough: parodoxical Prolapse visible?: no  Scar mobility: decreased  Internal Vaginal Exam: Strength (PERF): 2/5, 7 sec Symmetry: posterior> anterior for TTP Palpation: TTP through all except R anterior PR/PC. Prolapse: anterior wall visible at the level of the introitus with bearing down.  08/15/20: TTP to B OI, coccygeus, and PR/PC with scar tissue near ~ 5 o'clock   Abdominal:  08/01/20: Pt. Has difficulty recruiting TA, obliques are dominant.  **following TP release to extensors, TA recruitment improved but will require some review.  08/08/20: Pt. Able to recruit TA well with MIN TC, VC.  Able to progress to mini-marches for 2 reps but should keep practicing tilts at home.   TODAY: Pt. Needed MIN VC/TC to improve TA recruitment due to over-engagement of rectus. Able to perform consistently but weakly at EOS.  Palpation: 07/25/20: TTP to B iliacus and R QL  08/01/20: TTP to B multifidus and erector spinae from T11-L5 and R adductor brevis.  08/15/20: mild TTP to R adductor brevis  Gait Analysis:  INTERVENTIONS THIS SESSION: Therex: reviewed posterior pelvic tilts and taught in quad and mod-quad as alternatives. Educated on and practiced 2x10 standing hip EXT and ABD with MOD TC, VC and cues for exhale on exertion. Educated on and practiced kneeling plank and educated on standing alternative on Chair to increase accessibility at work. Discussed need to be diligent with strengthening but ability to be less regular with stretches over time and time-frame to see max improvement.   Total time: 60 min.                                   PT Short Term Goals - 07/18/20 1545      PT SHORT TERM GOAL #1   Title Pt. will demonstrate improved pelvic alignment in order to decrease pressure on nerve roots and allow for decreased spasm and pain as well as improved function of downstream tissues.    Baseline R up-slip and L anterior innominate rotation.    Time 6    Period Weeks    Status New    Target Date 08/29/20      PT SHORT TERM GOAL #2   Title Pt. will independently demonstrate HEP x1 in office to facilitate proper HEP performance and maximize outcomes.    Baseline Pt. lacks knowledge of which therapeutic exercises will help decrease Sx.    Time 6    Period Weeks    Status New    Target Date 08/29/20             PT Long Term Goals - 07/18/20 1551      PT LONG TERM GOAL #1   Title Pt. will demonstrate no UUI or SUI over the course of the prior 2 weeks to demonstrate decreased Sx. and improved function.    Baseline Pt. having both UUI and  SUI daily (if sneezes).    Time 12    Period Weeks    Status New    Target Date 10/10/20      PT LONG TERM GOAL #2   Title Pt. will demonstrate no greater than 2/10 LBP with a full shift at work to demonstrate decreased spasm and improved activity tolerance.    Baseline LBP is ~ 5/10 following a work day of if on her feet a lot.    Time 12    Period Weeks    Status New    Target Date 10/10/20  PT LONG TERM GOAL #3   Title Pt. will demonstrate a 10 point improvement in each category (or maximal score) to demonstrate improved Function.    Baseline FOTO PFDI Pain:25,  Urinary Problem: 54,  PFDI Urinary: 8    Time 12    Period Weeks    Status New    Target Date 10/10/20                 Plan - 08/29/20 1531    Clinical Impression Statement Pt. Responded well to all interventions today, demonstrating improved TA, Glute Max and Med recruitment as well as understanding and correct performance of all education and exercises provided today. They will continue to benefit from skilled physical therapy to work toward remaining goals and maximize function as well as decrease likelihood of symptom increase or recurrence.    PT Next Visit Plan Re-assess PFM PRN,  assess hip strength and review/expand on HEP    PT Home Exercise Plan diaphragmatic breathing, hip-flexor stretch, side-stretch, posterior pelvic tilt in hook-lying, knee planks, kegels, standing hip EXT and ABD, TA in quad and kneeling plank.    Consulted and Agree with Plan of Care Patient           Patient will benefit from skilled therapeutic intervention in order to improve the following deficits and impairments:     Visit Diagnosis: Abnormal posture  Sacrococcygeal disorders, not elsewhere classified  Other muscle spasm     Problem List Patient Active Problem List   Diagnosis Date Noted  . Ophthalmoplegic migraine, not intractable 10/08/2016  . B12 deficiency anemia 11/12/2011  . Vitamin D deficiency  11/12/2011   [name] DPT, ATC [name] 08/29/2020, 3:35 PM  [place] [facility] MAIN [facility] SERVICES [address] [place], [place], [address] Phone: [phone]   Fax:  [phone]  Name: THERA BASDEN MRN: [id number] Date of Birth: 06/14/1990

## 2020-08-29 NOTE — Patient Instructions (Signed)
Hip Abduction (Standing)    Stand with support. Squeeze deep core and hold. Start with leg to the side/back on your toe and gently squeeze the outer hip to lift the toe off of the ground. Hold for 1 second then repeat. You should feel this in the outer hip, not the front, not your side,  And not your back. Repeat __10_ times. Do _3__ sets  a day.  HIP Extension - Standing    Stand with support. Squeeze deep core and hold. Start with leg to the back on your toe and gently squeeze the buttock to lift the toe off of the ground. Hold for 1 second then repeat. You should feel this in the outer hip, not the front, not your side,  And not your back. Repeat __10_ times. Do _3__ sets  a day.    Breathe in, let belly relax down toward the floor and then breathe out, pulling the lower belly in toward the backbone.   Repeat this _2x15__ times _1-2__ times per day      Start on all fours, tuck your hips under slightly and come forward until you are in a straight line. Make sure that your knees, your hips, and your shoulders are in a straight line. Hold for ~ 30 seconds, (or until you feel like you are too tired to hold it correctly). Repeat 2-3 times (or up to ~ 1:30 sec. Total).   As you get stronger, you can lengthen each hold. When you can hold for ~ 1:30 straight, try switching to on your toes instead of knees. You will need to decrease your hold time initially and build back up to the full 1:30.

## 2020-09-03 ENCOUNTER — Other Ambulatory Visit (HOSPITAL_COMMUNITY): Payer: Self-pay

## 2020-09-05 ENCOUNTER — Other Ambulatory Visit: Payer: Self-pay

## 2020-09-05 ENCOUNTER — Ambulatory Visit: Payer: No Typology Code available for payment source | Attending: Obstetrics and Gynecology

## 2020-09-05 DIAGNOSIS — M533 Sacrococcygeal disorders, not elsewhere classified: Secondary | ICD-10-CM | POA: Diagnosis present

## 2020-09-05 DIAGNOSIS — R293 Abnormal posture: Secondary | ICD-10-CM | POA: Insufficient documentation

## 2020-09-05 DIAGNOSIS — M62838 Other muscle spasm: Secondary | ICD-10-CM | POA: Diagnosis present

## 2020-09-05 NOTE — Patient Instructions (Signed)
  For standing hip strengthening. Start on the toe. Little lifts with focus on feeling the contraction not increased height.  For TA in standing; use a lower surface so your back is parallel with the floor.   For planks: start with feet further out, tuck pelvis under then shift hips forward until you are in a straight line. Finally check your head alignment by "making a double chin"

## 2020-09-05 NOTE — Therapy (Signed)
[facility] MAIN [facility] : [facility] SERVICES [address] [place], [place], [address] Phone: [phone]   Fax:  [phone]  Physical Therapy Treatment  The patient has been informed of current processes in place at Outpatient Rehab to protect patients from Covid-19 exposure including social distancing, schedule modifications, and new cleaning procedures. After discussing their particular risk with a therapist based on the patient's personal risk factors, the patient has decided to proceed with in-person therapy.  Patient Details  Name: Jordan [name] MRN: [id number] Date of Birth: Jan 08, 1991 No data recorded  Encounter Date: 09/05/2020   PT End of Session - 09/05/20 1332    Visit Number 7    Number of Visits 12    Date for PT Re-Evaluation 10/10/20    Authorization Type UMR    Authorization Time Period 07/18/20 through 10/10/20    Authorization - Visit Number 7    Authorization - Number of Visits 12    Progress Note Due on Visit 10    PT Start Time 1035    PT Stop Time 1130    PT Time Calculation (min) 55 min    Activity Tolerance Patient tolerated treatment well;No increased pain    Behavior During Therapy WFL for tasks assessed/performed           Past Medical History:  Diagnosis Date  . Migraines   . PROM (premature rupture of membranes) 03/13/2018  . Supervision of normal first pregnancy, antepartum 08/08/2017    Nursing Staff Provider Office Location  [facility] Midwife preferred. No students/residents. Dating   LMP and U/S Language  English Anatomy US   normal female, anterior placenta Flu Vaccine  10/19 [facility] Employee Genetic Screen  NIPS: negative  AFP: neg TDaP vaccine   Given 10/8 Hgb A1C or  GTT Early  Third trimester 2 hour GTT wnl Rhogam   NA   LAB RESULTS  Feeding Plan  Breast Blood Type O/Positive/-    Past Surgical History:  Procedure Laterality Date  . WISDOM TOOTH EXTRACTION  May 2012    There were no vitals filed for this  visit.  Pelvic Floor Physical Therapy Treatment Note  SCREENING  Changes in medications, allergies, or medical history?: none    SUBJECTIVE  Patient reports: She has been having more urgency with increased water intake but has not had any leakage. Has increased up to ~ 32 oz. Of water in the last 2 days. LBP is central with extended standing but low. Still getting twinge on L occasionally, less than before. Wants to review standing hip strengthening.    Precautions:  Migraines  Pain update:  Location of pain: occasional LBP Current pain: 0/10  Max pain: 2/10 (twinge on L) Least pain: 0/10 Nature of pain:achy  ** no increased pain following treatment.  Patient Goals: To feel "normal" and not have leakage.    OBJECTIVE  Changes in: Posture/Observations:  07/25/20: R up-slip and L anterior rotation   08/01/20: ASIS and PSIS appear level in standing and supine.  Range of Motion/Flexibilty:    Strength/MMT:  LE MMT:   Pelvic Floor External Exam: Introitus Appears: gaping Skin integrity: WNL Palpation: TTP to L STP Cough: parodoxical Prolapse visible?: no Scar mobility: decreased  Internal Vaginal Exam: Strength (PERF): 2/5, 7 sec Symmetry: posterior> anterior for TTP Palpation: TTP through all except R anterior PR/PC. Prolapse: anterior wall visible at the level of the introitus with bearing down.  08/15/20: TTP to B OI, coccygeus, and PR/PC with scar tissue  near ~ 5 o'clock   TODAY: TTP to B STP and anterior PR/PC with urge to urinate reproduced with pressure on R anterior PR/PC. ~ 3/5 following release and VC to "nod clitoris  Abdominal:  08/01/20: Pt. Has difficulty recruiting TA, obliques are dominant.  **following TP release to extensors, TA recruitment improved but will require some review.  08/08/20: Pt. Able to recruit TA well with MIN TC, VC. Able to progress to mini-marches for 2 reps but should keep practicing tilts at home.   08/29/20: Pt.  Needed MIN VC/TC to improve TA recruitment due to over-engagement of rectus. Able to perform consistently but weakly at EOS.  Palpation: 07/25/20: TTP to B iliacus and R QL  08/01/20: TTP to B multifidus and erector spinae from T11-L5 and R adductor brevis.  08/15/20: mild TTP to R adductor brevis  Gait Analysis:  INTERVENTIONS THIS SESSION:  Manual: Performed TP release to B STP and anterior PR/PC decrease spasm and allow for improved recruitment.   Therex: Reviewed Kegels and gave cue for clitoral "nod" to improve kegel and decrease urgency. Reviewed standing planks, TA recruitment exercise and hip EXT and ABD to improve HEP performance and increase pelvic stability to allow for decreased spasm and improve PFM function.  Total time: 60 min.                              PT Short Term Goals - 07/18/20 1545      PT SHORT TERM GOAL #1   Title Pt. will demonstrate improved pelvic alignment in order to decrease pressure on nerve roots and allow for decreased spasm and pain as well as improved function of downstream tissues.    Baseline R up-slip and L anterior innominate rotation.    Time 6    Period Weeks    Status New    Target Date 08/29/20      PT SHORT TERM GOAL #2   Title Pt. will independently demonstrate HEP x1 in office to facilitate proper HEP performance and maximize outcomes.    Baseline Pt. lacks knowledge of which therapeutic exercises will help decrease Sx.    Time 6    Period Weeks    Status New    Target Date 08/29/20             PT Long Term Goals - 07/18/20 1551      PT LONG TERM GOAL #1   Title Pt. will demonstrate no UUI or SUI over the course of the prior 2 weeks to demonstrate decreased Sx. and improved function.    Baseline Pt. having both UUI and SUI daily (if sneezes).    Time 12    Period Weeks    Status New    Target Date 10/10/20      PT LONG TERM GOAL #2   Title Pt. will demonstrate no greater than 2/10 LBP with a  full shift at work to demonstrate decreased spasm and improved activity tolerance.    Baseline LBP is ~ 5/10 following a work day of if on her feet a lot.    Time 12    Period Weeks    Status New    Target Date 10/10/20      PT LONG TERM GOAL #3   Title Pt. will demonstrate a 10 point improvement in each category (or maximal score) to demonstrate improved Function.    Baseline FOTO PFDI Pain:25,  Urinary Problem: 54,  PFDI Urinary: 8    Time 12    Period Weeks    Status New    Target Date 10/10/20                 Plan - 09/05/20 1333    Clinical Impression Statement Pt. Responded well to all interventions today, demonstrating decreased spasm and improved PFM recruitment as well as understanding and correct performance of all education and exercises provided today. They will continue to benefit from skilled physical therapy to work toward remaining goals and maximize function as well as decrease likelihood of symptom increase or recurrence.    PT Next Visit Plan assess hip strength and review/expand on HEP    PT Home Exercise Plan diaphragmatic breathing, hip-flexor stretch, side-stretch, posterior pelvic tilt in hook-lying, knee planks, kegels, standing hip EXT and ABD, TA in quad and kneeling plank.    Consulted and Agree with Plan of Care Patient           Patient will benefit from skilled therapeutic intervention in order to improve the following deficits and impairments:     Visit Diagnosis: Abnormal posture  Sacrococcygeal disorders, not elsewhere classified  Other muscle spasm     Problem List Patient Active Problem List   Diagnosis Date Noted  . Ophthalmoplegic migraine, not intractable 10/08/2016  . B12 deficiency anemia 11/12/2011  . Vitamin D deficiency 11/12/2011   [name] DPT, ATC [name] 09/05/2020, 1:36 PM  [facility] MAIN [facility] SERVICES [address] [place], [place], [address] Phone:  [phone]   Fax:  [phone]  Name: ANNASTASIA HASKINS MRN: [id number] Date of Birth: 1990-12-07

## 2020-09-12 ENCOUNTER — Other Ambulatory Visit: Payer: Self-pay

## 2020-09-12 ENCOUNTER — Ambulatory Visit: Payer: No Typology Code available for payment source

## 2020-09-12 DIAGNOSIS — R293 Abnormal posture: Secondary | ICD-10-CM | POA: Diagnosis not present

## 2020-09-12 DIAGNOSIS — M533 Sacrococcygeal disorders, not elsewhere classified: Secondary | ICD-10-CM

## 2020-09-12 DIAGNOSIS — M62838 Other muscle spasm: Secondary | ICD-10-CM

## 2020-09-12 NOTE — Therapy (Signed)
[place] MAIN [facility] SERVICES [address] [place], [place], [address] Phone: [phone]   Fax:  [phone]  Physical Therapy Treatment  The patient has been informed of current processes in place at Outpatient Rehab to protect patients from Covid-19 exposure including social distancing, schedule modifications, and new cleaning procedures. After discussing their particular risk with a therapist based on the patient's personal risk factors, the patient has decided to proceed with in-person therapy.   Patient Details  Name: Jordan [name] MRN: [id number] Date of Birth: 1991/03/28 No data recorded  Encounter Date: 09/12/2020   PT End of Session - 09/12/20 1003     Visit Number 8    Number of Visits 12    Date for PT Re-Evaluation 10/10/20    Authorization Type UMR    Authorization Time Period 07/18/20 through 10/10/20    Authorization - Visit Number 8    Authorization - Number of Visits 12    Progress Note Due on Visit 10    PT Start Time 0730    PT Stop Time 0830    PT Time Calculation (min) 60 min    Activity Tolerance Patient tolerated treatment well;No increased pain    Behavior During Therapy WFL for tasks assessed/performed             Past Medical History:  Diagnosis Date   Migraines    PROM (premature rupture of membranes) 03/13/2018   Supervision of normal first pregnancy, antepartum 08/08/2017    Nursing Staff Provider Office Location  [facility] Midwife preferred. No students/residents. Dating   LMP and U/S Language  English Anatomy US   normal female, anterior placenta Flu Vaccine  10/19 [facility] Employee Genetic Screen  NIPS: negative  AFP: neg TDaP vaccine   Given 10/8 Hgb A1C or  GTT Early  Third trimester 2 hour GTT wnl Rhogam   NA   LAB RESULTS  Feeding Plan  Breast Blood Type O/Positive/-    Past Surgical History:  Procedure Laterality Date   WISDOM TOOTH EXTRACTION  May 2012    There were no vitals filed for this  visit.   Pelvic Floor Physical Therapy Treatment Note  SCREENING  Changes in medications, allergies, or medical history?: none    SUBJECTIVE  Patient reports: She has been doing pretty well but she has still not increased her water intake past 32 oz. She had leakage with a sneeze yesterday (a little) which had not happened in a while. The Kegels feel different since releasing them last time. She is not able to find a surface low enough at work to do her standing core exercises. Has not had a bad headache since around her last period. She increased her nortriptyline dosage.    Precautions:  Migraines  Pain update:  Location of pain: occasional LBP Current pain:  0/10  Max pain:  2/10 (twinge on L) Least pain:  0/10 Nature of pain: achy  ** no increased pain following treatment.   Patient Goals: To feel "normal" and not have leakage.    OBJECTIVE  Changes in: Posture/Observations:  07/25/20: R up-slip and L anterior rotation   08/01/20: ASIS and PSIS appear level in standing and supine.  Range of Motion/Flexibilty:    Strength/MMT:  LE MMT:   LE MMT Left Right  Hip flex:  (L2) 4/5 4/5  Hip ext: 5/5 /5  Hip abd: 4+/5 5/5  Hip add: 4/5 4/5  Hip IR 4+/5 4+/5  Hip ER 4+/5 5/5  Pelvic Floor External Exam: Introitus Appears: gaping Skin integrity: WNL Palpation: TTP to L STP Cough: parodoxical Prolapse visible?: no Scar mobility: decreased  Internal Vaginal Exam: Strength (PERF): 2/5, 7 sec Symmetry: posterior> anterior for TTP Palpation: TTP through all except R anterior PR/PC. Prolapse: anterior wall visible at the level of the introitus with bearing down.  08/15/20: TTP to B OI, coccygeus, and PR/PC with scar tissue near ~ 5 o'clock   09/05/20: TTP to B STP and anterior PR/PC with urge to urinate reproduced with pressure on R anterior PR/PC. ~ 3/5 following release and VC to "nod clitoris.  Abdominal:  08/01/20: Pt. Has difficulty recruiting TA,  obliques are dominant.  **following TP release to extensors, TA recruitment improved but will require some review.  08/08/20: Pt. Able to recruit TA well with MIN TC, VC. Able to progress to mini-marches for 2 reps but should keep practicing tilts at home.   08/29/20: Pt. Needed MIN VC/TC to improve TA recruitment due to over-engagement of rectus. Able to perform consistently but weakly at EOS.  Palpation: 07/25/20: TTP to B iliacus and R QL  08/01/20: TTP to B multifidus and erector spinae from T11-L5 and R adductor brevis.  08/15/20: mild TTP to R adductor brevis  Gait Analysis:  INTERVENTIONS THIS SESSION:  Theract: Educated on and practiced body mechanics with lifting specifically related to child-care and work requirements. Educated on soda-can theory and using legs/glutes to take the burden as well as engaging the TA and multifidus to protect the LB and PFM.  Therex: Educated on Squats, mini-marches and toe-taps as well as progressions of each to allow Pt. To continue to build deep-core strength following D/C to maintain decreased Sx.  Total time: 60 min.                              PT Short Term Goals - 09/12/20 5329       PT SHORT TERM GOAL #1   Title Pt. will demonstrate improved pelvic alignment in order to decrease pressure on nerve roots and allow for decreased spasm and pain as well as improved function of downstream tissues.    Baseline R up-slip and L anterior innominate rotation.    Time 6    Period Weeks    Status Achieved    Target Date 08/29/20      PT SHORT TERM GOAL #2   Title Pt. will independently demonstrate HEP x1 in office to facilitate proper HEP performance and maximize outcomes.    Baseline Pt. lacks knowledge of which therapeutic exercises will help decrease Sx.    Time 6    Period Weeks    Status Achieved    Target Date 08/29/20               PT Long Term Goals - 09/12/20 0824       PT LONG TERM GOAL #1   Title  Pt. will demonstrate no UUI or SUI over the course of the prior 2 weeks to demonstrate decreased Sx. and improved function.    Baseline Pt. having both UUI and SUI daily (if sneezes).    Time 12    Period Weeks    Status Partially Met    Target Date 10/10/20      PT LONG TERM GOAL #2   Title Pt. will demonstrate no greater than 2/10 LBP with a full shift at work to demonstrate decreased spasm and improved  activity tolerance.    Baseline LBP is ~ 5/10 following a work day of if on her feet a lot.    Time 12    Period Weeks    Status Achieved    Target Date 10/10/20      PT LONG TERM GOAL #3   Title Pt. will demonstrate a 10 point improvement in each category (or maximal score) to demonstrate improved Function.    Baseline FOTO PFDI Pain:25,  Urinary Problem: 54,  PFDI Urinary: 8    Time 12    Period Weeks    Status On-going    Target Date 10/10/20      PT LONG TERM GOAL #4   Title Pt. will demonstrate good running mechanics to facilitate weight loss and overall health.    Baseline Pt. wants to exercise but is not confident she can do so safely.    Time 4    Period Weeks    Status New    Target Date 10/10/20                   Plan - 09/12/20 1003     Clinical Impression Statement Pt. Responded well to all interventions today, demonstrating improved deep-core recruitment and coordination, better body mechanics and ability to feel decreased pain with correct mechanics, as well as understanding and correct performance of all education and exercises provided today. They will continue to benefit from skilled physical therapy to work toward remaining goals and maximize function as well as decrease likelihood of symptom increase or recurrence.     PT Next Visit Plan assess running mechanics and educate. review deep-core engagement in the context of exercise-specific movements. D/C if appropriate.    PT Home Exercise Plan diaphragmatic breathing, hip-flexor stretch, side-stretch,  posterior pelvic tilt in hook-lying, knee planks, kegels, standing hip EXT and ABD, TA in quad and kneeling plank, mini-marches, toe-taps, squats, body mechanics.    Consulted and Agree with Plan of Care Patient             Patient will benefit from skilled therapeutic intervention in order to improve the following deficits and impairments:     Visit Diagnosis: Abnormal posture  Sacrococcygeal disorders, not elsewhere classified  Other muscle spasm     Problem List Patient Active Problem List   Diagnosis Date Noted   Ophthalmoplegic migraine, not intractable 10/08/2016   B12 deficiency anemia 11/12/2011   Vitamin D deficiency 11/12/2011   Willa Rough DPT, ATC Willa Rough 09/12/2020, 10:05 AM  [place] MAIN [facility] SERVICES [address] [place], [place], [address] Phone: [phone]   Fax:  [phone]  Name: ERNISHA SORN MRN: [id number] Date of Birth: June 30, 1990

## 2020-09-12 NOTE — Patient Instructions (Signed)
  Keep your trunk as one unit and let it hinge forward from the hips as you push your bottom back and bend your knees at the same rate that you bend your hips. Keep your weight back toward your heels but do not actually lift the toes off the ground. Exhale starting just before and all the way through standing to help engage the glutes and lower tummy muscles.  Do 3x10 daily for 2 weeks and focus on using good mechanics with every-day lifting.   Start doing mini-marches, making sure that you have a small folded towel under the LB and are pressing into it to keep the obliques from over-compensating. Once you can do these easily then start to reach the foot out further to increase the difficulty.  Once mini-marches are getting easy, start doing toe-taps as pictured above. Start by setting the core then reach one foot down toward the mat at a time. To progress, reach the toe further out then take away the support of your hands to challenge. EVENTUALLY you can progress to straight legs.  Safety tips: If your low back is flat on the mat and you are not having separation at the diastasis, you are safe, if either of those is not occurring, then it is too difficult right now, try again in a few weeks.    EXhale on EXertion!!!!! (pushing, pulling, lifting, standing, etc.)  Whenever you exert force you need to exhale to allow the pressure to escape out of your vocal cords rather than be pressed down through your pelvic floor. Exhaling also helps engage your deep-core muscles to protect your back and pelvic floor.

## 2020-09-19 ENCOUNTER — Other Ambulatory Visit: Payer: Self-pay

## 2020-09-19 ENCOUNTER — Ambulatory Visit: Payer: No Typology Code available for payment source

## 2020-09-19 DIAGNOSIS — R293 Abnormal posture: Secondary | ICD-10-CM

## 2020-09-19 DIAGNOSIS — M62838 Other muscle spasm: Secondary | ICD-10-CM

## 2020-09-19 DIAGNOSIS — M533 Sacrococcygeal disorders, not elsewhere classified: Secondary | ICD-10-CM

## 2020-09-19 NOTE — Therapy (Signed)
[place] MAIN [facility] SERVICES [address] [place], [place], [address] Phone: [phone]   Fax:  [phone]  Physical Therapy Treatment and Discharge Summary  The patient has been informed of current processes in place at Outpatient Rehab to protect patients from Covid-19 exposure including social distancing, schedule modifications, and new cleaning procedures. After discussing their particular risk with a therapist based on the patient's personal risk factors, the patient has decided to proceed with in-person therapy.   Patient Details  Name: Jordan [name] MRN: [id number] Date of Birth: [birth year] No data recorded  Encounter Date: 09/19/2020   PT End of Session - 09/19/20 0943     Visit Number 9    Number of Visits 12    Date for PT Re-Evaluation 10/10/20    Authorization Type UMR    Authorization Time Period 07/18/20 through 10/10/20    Authorization - Visit Number 9    Authorization - Number of Visits 12    Progress Note Due on Visit 10    PT Start Time 0730    PT Stop Time 0830    PT Time Calculation (min) 60 min    Activity Tolerance Patient tolerated treatment well;No increased pain    Behavior During Therapy WFL for tasks assessed/performed             Past Medical History:  Diagnosis Date   Migraines    PROM (premature rupture of membranes) 03/13/2018   Supervision of normal first pregnancy, antepartum 08/08/2017    Nursing Staff Provider Office Location  [facility] Midwife preferred. No students/residents. Dating   LMP and U/S Language  English Anatomy US   normal female, anterior placenta Flu Vaccine  10/19 [facility] Employee Genetic Screen  NIPS: negative  AFP: neg TDaP vaccine   Given 10/8 Hgb A1C or  GTT Early  Third trimester 2 hour GTT wnl Rhogam   NA   LAB RESULTS  Feeding Plan  Breast Blood Type O/Positive/-    Past Surgical History:  Procedure Laterality Date   WISDOM TOOTH EXTRACTION  May 2012    There were no vitals  filed for this visit.  Pelvic Floor Physical Therapy Treatment and Discharge Summary  SCREENING  Changes in medications, allergies, or medical history?: none    SUBJECTIVE  Patient reports: She Is still doing well symptom wise. No UI at all last week. Had minor LBP one day at work which resolved with sitting for a "little while". She still has to focus when doing hip ABD but its getting there".    Precautions:  Migraines  Pain update:  Location of pain: occasional LBP Current pain:  0/10  Max pain:  2/10 (twinge on L) Least pain:  0/10 Nature of pain: achy  ** no increased pain following treatment.   Patient Goals: To feel "normal" and not have leakage.    OBJECTIVE  Changes in: Posture/Observations:  07/25/20: R up-slip and L anterior rotation   08/01/20: ASIS and PSIS appear level in standing and supine.  Range of Motion/Flexibilty:    Strength/MMT:  LE MMT:   LE MMT Left Right  Hip flex:  (L2) 4/5 4/5  Hip ext: 5/5 /5  Hip abd: 4+/5 5/5  Hip add: 4/5 4/5  Hip IR 4+/5 4+/5  Hip ER 4+/5 5/5    Pelvic Floor External Exam: Introitus Appears: gaping Skin integrity: WNL Palpation: TTP to L STP Cough: parodoxical Prolapse visible?: no Scar mobility: decreased  Internal Vaginal Exam: Strength (PERF):  2/5, 7 sec Symmetry: posterior> anterior for TTP Palpation: TTP through all except R anterior PR/PC. Prolapse: anterior wall visible at the level of the introitus with bearing down.  08/15/20: TTP to B OI, coccygeus, and PR/PC with scar tissue near ~ 5 o'clock   09/05/20: TTP to B STP and anterior PR/PC with urge to urinate reproduced with pressure on R anterior PR/PC. ~ 3/5 following release and VC to "nod clitoris.  Abdominal:  08/01/20: Pt. Has difficulty recruiting TA, obliques are dominant.  **following TP release to extensors, TA recruitment improved but will require some review.  08/08/20: Pt. Able to recruit TA well with MIN TC, VC. Able to  progress to mini-marches for 2 reps but should keep practicing tilts at home.   08/29/20: Pt. Needed MIN VC/TC to improve TA recruitment due to over-engagement of rectus. Able to perform consistently but weakly at EOS.  Palpation: 07/25/20: TTP to B iliacus and R QL  08/01/20: TTP to B multifidus and erector spinae from T11-L5 and R adductor brevis.  08/15/20: mild TTP to R adductor brevis  Gait Analysis:  INTERVENTIONS THIS SESSION:  Theract: Educated on and gave references for progressive relaxation technique to decrease allow for self-management of stress/pelvic floor tension to prevent return of spasm and PFM dysfunction.  Therex: Educated on pushing/pulling/lifting and overhead push with cable machine and emphasis on body mechanics and breath to help Pt. Integrate deep-core into all exercise to prevent injury. Educated on starting a rotator cuff strengthening and continuing hip and deep core strengthening programs to prevent injury as she increases activity level.   Gait-training: Assessed and corrected running mechanics and walking mechanics to minimize impact on the LB and hip-flexors and prevent long-term pelvic or hip/back/knee issues by minimizing the GRF.  Total time: 60 min.                              PT Short Term Goals - 09/12/20 7782       PT SHORT TERM GOAL #1   Title Pt. will demonstrate improved pelvic alignment in order to decrease pressure on nerve roots and allow for decreased spasm and pain as well as improved function of downstream tissues.    Baseline R up-slip and L anterior innominate rotation.    Time 6    Period Weeks    Status Achieved    Target Date 08/29/20      PT SHORT TERM GOAL #2   Title Pt. will independently demonstrate HEP x1 in office to facilitate proper HEP performance and maximize outcomes.    Baseline Pt. lacks knowledge of which therapeutic exercises will help decrease Sx.    Time 6    Period Weeks    Status  Achieved    Target Date 08/29/20               PT Long Term Goals - 09/12/20 0824       PT LONG TERM GOAL #1   Title Pt. will demonstrate no UUI or SUI over the course of the prior 2 weeks to demonstrate decreased Sx. and improved function.    Baseline Pt. having both UUI and SUI daily (if sneezes).    Time 12    Period Weeks    Status Partially Met    Target Date 10/10/20      PT LONG TERM GOAL #2   Title Pt. will demonstrate no greater than 2/10 LBP with a  full shift at work to demonstrate decreased spasm and improved activity tolerance.    Baseline LBP is ~ 5/10 following a work day of if on her feet a lot.    Time 12    Period Weeks    Status Achieved    Target Date 10/10/20      PT LONG TERM GOAL #3   Title Pt. will demonstrate a 10 point improvement in each category (or maximal score) to demonstrate improved Function.    Baseline FOTO PFDI Pain:25,  Urinary Problem: 54,  PFDI Urinary: 8    Time 12    Period Weeks    Status On-going    Target Date 10/10/20      PT LONG TERM GOAL #4   Title Pt. will demonstrate good running mechanics to facilitate weight loss and overall health.    Baseline Pt. wants to exercise but is not confident she can do so safely.    Time 4    Period Weeks    Status New    Target Date 10/10/20                   Plan - 09/19/20 0943     Clinical Impression Statement Pt. has met all goals and demonstrates understanding of exercise precautions to be able to safely increase activity level and finness. She feels confident she can maintain through use of her HEP. We will D/C at this time to HEP.    Personal Factors and Comorbidities Comorbidity 1    Comorbidities migraines    Examination-Activity Limitations Continence    Examination-Participation Restrictions Occupation    PT Next Visit Plan D/C    PT Home Exercise Plan diaphragmatic breathing, hip-flexor stretch, side-stretch, posterior pelvic tilt in hook-lying, knee planks,  kegels, standing hip EXT and ABD, TA in quad and kneeling plank, mini-marches, toe-taps, squats, body mechanics, running, pushing,pulling, lifting safety with exercise and progressive relaxation technique.    Consulted and Agree with Plan of Care Patient             Patient will benefit from skilled therapeutic intervention in order to improve the following deficits and impairments:  Decreased activity tolerance, Decreased strength, Decreased scar mobility, Increased muscle spasms, Pain, Postural dysfunction  Visit Diagnosis: Abnormal posture  Sacrococcygeal disorders, not elsewhere classified  Other muscle spasm     Problem List Patient Active Problem List   Diagnosis Date Noted   Ophthalmoplegic migraine, not intractable 10/08/2016   B12 deficiency anemia 11/12/2011   Vitamin D deficiency 11/12/2011   Willa Rough DPT, ATC Willa Rough 09/19/2020, 9:53 AM  [place] MAIN [facility] SERVICES [address] [place], [place], [address] Phone: [phone]   Fax:  [phone]  Name: Jordan [name] MRN: [id number] Date of Birth: 01-02-1991

## 2020-09-19 NOTE — Patient Instructions (Addendum)
Try using Progressive relaxation technique (look for guided versions on youtube) if you find that you are carrying tension around or very stressed.  Progressive relaxation technique:  Start by tensing all the muscles in your right foot, then calf, then upper leg, glute, low back and hold for 2 deep breaths, release the tension and let the limb melt into the table. Next start with the left leg and do the same progression, foot, lower leg, upper leg, glute, low back, then release. Then the Right hand make a fist, tense the forearm, the upper arm, shoulder, hold for two breaths, and release letting it melt into the ground, repeat on the left. Finally, start with both feet, both calves, both upper legs, both glutes, lower abdomen, upper abdomen, make fists, tighten forearms, upper arms, shoulders, face. Hold for two breaths and then completely melt into the floor.    Look for "throwers ten" or other basic rotator cuff strengthening program to help prevent injuries and keep the hips and core strong.     Start by leaning forward as a single unit then "catching" yourself to find the angle of forward lean that is appropriate. Minimize "bobbing" up and down and take faster, slightly shorter steps as if you are just repeatedly catching yourself as you lean forward rather than trying to "push" yourself forward.   Neutral pelvic tilt, tall spine  Draw the lumbar spine into the mat and keep the TA engaged with all core exercise.  Use your hand to test and see if your diastasis is narrowing with exercise. If it widens or does not narrow then you are doing an exercise that is too difficult, try to midify the intensity down for a few weeks then try again.   Exhale on exertion, make sure that the angle in your hip matches the angle of the knee and you are not over-arching or rounding the spine.

## 2020-09-26 ENCOUNTER — Ambulatory Visit: Payer: No Typology Code available for payment source

## 2021-01-27 ENCOUNTER — Telehealth: Payer: No Typology Code available for payment source | Admitting: Emergency Medicine

## 2021-01-27 DIAGNOSIS — J329 Chronic sinusitis, unspecified: Secondary | ICD-10-CM

## 2021-01-27 DIAGNOSIS — B9689 Other specified bacterial agents as the cause of diseases classified elsewhere: Secondary | ICD-10-CM

## 2021-01-27 MED ORDER — AMOXICILLIN-POT CLAVULANATE 875-125 MG PO TABS: 1.0000 | ORAL_TABLET | Freq: Two times a day (BID) | ORAL | 0 refills | Status: DC

## 2021-01-27 NOTE — Progress Notes (Signed)

## 2021-02-24 ENCOUNTER — Telehealth (INDEPENDENT_AMBULATORY_CARE_PROVIDER_SITE_OTHER): Payer: No Typology Code available for payment source | Admitting: Adult Health

## 2021-02-24 DIAGNOSIS — G43119 Migraine with aura, intractable, without status migrainosus: Secondary | ICD-10-CM | POA: Diagnosis not present

## 2021-02-24 NOTE — Progress Notes (Signed)
PATIENT: Jordan [name] DOB: 16-Jun-1990  REASON FOR VISIT: follow up HISTORY FROM: patient  Virtual Visit via Video Note  I connected with Jordan [name] on 02/24/21 at  2:00 PM EST by a video enabled telemedicine application located remotely at [facility] and verified that I am speaking with the correct person using two identifiers who was located at their own home.   I discussed the limitations of evaluation and management by telemedicine and the availability of in person appointments. The patient expressed understanding and agreed to proceed.   PATIENT: Jordan [name] DOB: March 01, 1991  REASON FOR VISIT: follow up HISTORY FROM: patient  HISTORY OF PRESENT ILLNESS: Today 02/24/21:  Jordan [name] is a 30 year old female with a history of migraine headaches.  She returns today for follow-up.  She states that she stopped nortriptyline because she felt that it made her mood worse.  She states that she felt more sad.  Felt it also made her sleepy.  She continues to have 2-3 mild headaches a week.  They typically respond well to Tylenol or Advil.  She has severe headaches a week of her menstrual cycle.  She does use Maxalt for severe headaches but reports that it does cause her to feel nauseous.  07/15/20: Jordan [name] is a 30 year old female with a history of migraine headaches.  She returns today for follow-up.  She states that she is no longer breast-feeding.  She has approximately 1-3 headaches a week.  These are typically mild headaches that respond well to Tylenol.  She has approximately 2 severe headaches a month that does not respond to medication.  Her severe headaches usually occur around her menstrual cycle.  In the past nortriptyline and Maxalt have worked well for her.  HISTORY 04/17/20:   Jordan [name] is a 30 year old female with a history of migraine headaches.  She returns today for follow-up.  She is currently breast-feeding.  She states that she is having  approximately 2-3 migraines a week.  They typically occur above the left eye.  She does have photophobia and phonophobia as well as nausea but no vomiting.  She is interested in trying preventative medication as long as safe during breast-feeding.  REVIEW OF SYSTEMS: Out of a complete 14 system review of symptoms, the patient complains only of the following symptoms, and all other reviewed systems are negative.  See HPI  ALLERGIES: Allergies  Allergen Reactions   Latex Swelling and Rash    HOME MEDICATIONS: Outpatient Medications Prior to Visit  Medication Sig Dispense Refill   acetaminophen (TYLENOL) 500 MG tablet Take 500 mg by mouth every 8 (eight) hours as needed for mild pain or headache.     amoxicillin-clavulanate (AUGMENTIN) 875-125 MG tablet Take 1 tablet by mouth 2 (two) times daily. 20 tablet 0   nortriptyline (PAMELOR) 10 MG capsule Take 2 capsules (20 mg total) by mouth at bedtime. 180 capsule 3   rizatriptan (MAXALT) 10 MG tablet Take 1 tablet (10 mg total) by mouth as needed for migraine. May repeat in 2 hours if needed 10 tablet 11   No facility-administered medications prior to visit.    PAST MEDICAL HISTORY: Past Medical History:  Diagnosis Date   Migraines    PROM (premature rupture of membranes) 03/13/2018   Supervision of normal first pregnancy, antepartum 08/08/2017    Nursing Staff Provider Office Location  [facility] Midwife preferred. No students/residents. Dating   LMP and U/S Language  English Anatomy US  normal female, anterior placenta Flu Vaccine  10/19 [facility] Employee Genetic Screen  NIPS: negative  AFP: neg TDaP vaccine   Given 10/8 Hgb A1C or  GTT Early  Third trimester 2 hour GTT wnl Rhogam   NA   LAB RESULTS  Feeding Plan  Breast Blood Type O/Positive/-    PAST SURGICAL HISTORY: Past Surgical History:  Procedure Laterality Date   WISDOM TOOTH EXTRACTION  May 2012    FAMILY HISTORY: Family History  Problem Relation Age of Onset   Hypertension Father     Hyperlipidemia Father    Cancer Maternal Grandmother    Cancer Maternal Grandfather    Cancer Paternal Grandmother     SOCIAL HISTORY: Social History   Socioeconomic History   Marital status: Married    Spouse name: Jordan [name]   Number of children: 0   Years of education: BSN   Highest education level: Not on file  Occupational History   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  Tobacco Use   Smoking status: Never   Smokeless tobacco: Never  Vaping Use   Vaping Use: Never used  Substance and Sexual Activity   Alcohol use: Not Currently   Drug use: No   Sexual activity: Yes    Birth control/protection: None    Comment: Vasectomy  Other Topics Concern   Not on file  Social History Narrative   Nurse at [employer]   Married with 2 children   Caffeine use: 3-4 times per week Jordan [name])   Right handed   Social Determinants of Health   Financial Resource Strain: Not on file  Food Insecurity: Not on file  Transportation Needs: Not on file  Physical Activity: Not on file  Stress: Not on file  Social Connections: Not on file  Intimate Partner Violence: Not on file      PHYSICAL EXAM Generalized: Well developed, in no acute distress   Neurological examination  Mentation: Alert oriented to time, place, history taking. Follows all commands speech and language fluent Cranial nerve II-XII:Extraocular movements were full. Facial symmetry noted. uvula tongue midline. Head turning and shoulder shrug  were normal and symmetric. Motor: Good strength throughout subjectively per patient Sensory: Sensory testing is intact to soft touch on all 4 extremities subjectively per patient Coordination: Cerebellar testing reveals good finger-nose-finger  Gait and station: Patient is able to stand from a seated position. gait is normal.  Reflexes: UTA  DIAGNOSTIC DATA (LABS, IMAGING, TESTING) - I reviewed patient records, labs, notes, testing and imaging myself where available.  Lab Results  Component  Value Date   WBC 11.5 (H) 06/10/2019   HGB 10.9 (L) 06/10/2019   HCT 32.8 (L) 06/10/2019   MCV 97.3 06/10/2019   PLT 166 06/10/2019      Component Value Date/Time   NA 137 05/11/2017 1844   K 4.0 05/11/2017 1844   CL 102 05/11/2017 1844   CO2 26 05/11/2017 1844   GLUCOSE 97 05/11/2017 1844   BUN 11 05/11/2017 1844   CREATININE 0.76 05/11/2017 1844   CALCIUM 8.7 (L) 05/11/2017 1844   GFRNONAA >60 05/11/2017 1844   GFRAA >60 05/11/2017 1844    Lab Results  Component Value Date   TSH 3.71 12/23/2015      ASSESSMENT AND PLAN 30 y.o. year old female  has a past medical history of Migraines, PROM (premature rupture of membranes) (03/13/2018), and Supervision of normal first pregnancy, antepartum (08/08/2017). here with   1.  Migraine headaches:  -- Patient does not wish to  try any new medication -- Continue to monitor headaches -- She did ask about birth control advised that migraine with aura and an estrogen-based birth control increases her risk for stroke.  She will discuss with her OB/GYN regarding other options. --Follow-up in 1 year or sooner if needed   Butch Penny, MSN, NP-C 02/24/2021, 1:50 PM [facility] [address] [place], [place] [address] [phone]

## 2021-10-28 ENCOUNTER — Ambulatory Visit: Payer: No Typology Code available for payment source | Admitting: Physician Assistant

## 2021-11-26 ENCOUNTER — Other Ambulatory Visit: Payer: Self-pay

## 2021-11-26 ENCOUNTER — Ambulatory Visit (INDEPENDENT_AMBULATORY_CARE_PROVIDER_SITE_OTHER): Payer: No Typology Code available for payment source | Admitting: Nurse Practitioner

## 2021-11-26 ENCOUNTER — Encounter: Payer: Self-pay | Admitting: Nurse Practitioner

## 2021-11-26 VITALS — BP 110/78 | HR 98 | Temp 98.1°F | Resp 18 | Ht 58.25 in | Wt 143.4 lb

## 2021-11-26 DIAGNOSIS — Z01419 Encounter for gynecological examination (general) (routine) without abnormal findings: Secondary | ICD-10-CM

## 2021-11-26 DIAGNOSIS — D519 Vitamin B12 deficiency anemia, unspecified: Secondary | ICD-10-CM | POA: Diagnosis not present

## 2021-11-26 DIAGNOSIS — G43B0 Ophthalmoplegic migraine, not intractable: Secondary | ICD-10-CM | POA: Diagnosis not present

## 2021-11-26 DIAGNOSIS — Z7689 Persons encountering health services in other specified circumstances: Secondary | ICD-10-CM

## 2021-11-26 DIAGNOSIS — Z131 Encounter for screening for diabetes mellitus: Secondary | ICD-10-CM

## 2021-11-26 DIAGNOSIS — Z1159 Encounter for screening for other viral diseases: Secondary | ICD-10-CM | POA: Diagnosis not present

## 2021-11-26 DIAGNOSIS — N8110 Cystocele, unspecified: Secondary | ICD-10-CM

## 2021-11-26 DIAGNOSIS — E559 Vitamin D deficiency, unspecified: Secondary | ICD-10-CM | POA: Diagnosis not present

## 2021-11-26 NOTE — Progress Notes (Deleted)
vision

## 2021-11-26 NOTE — Progress Notes (Signed)
BP 110/78   Pulse 98   Temp 98.1 F (36.7 C) (Oral)   Resp 18   Ht 4' 10.25" (1.48 m)   Wt 143 lb 6.4 oz (65 kg)   SpO2 98%   BMI 29.71 kg/m    Subjective:    Patient ID: Jordan [name], female    DOB: 12/14/1990, 31 y.o.   MRN: [id number]  HPI: Jordan [name] is a 31 y.o. female  Chief Complaint  Patient presents with   Establish Care   Referral    [facility]   Establish care: Her last physical was 06/2020.  Her last pap was 06/12/2020. She has a history of migraines, anemia due to b12 deficiency and vitamin d deficiency.   Anemia due to b 12 deficiency/vitamin d deficiency:  She is not currently taking supplements at this time. Will get labs.   Bladder prolapse: She had been seeing GYN in [place] and was sent to physical therapy.  Patient states she needs a new obgyn in this area.  She says the physical therapy did help a little but she can still tell that it is prolapsed.  She does have pain occasionally.   Migraines: Patient is currently seeing neurology in [facility] for her migraines.  She is currently taking Maxalt.  Patient states she gets a migraine about once a week.  Patient states that they are working on her treatment plan.  Relevant past medical, surgical, family and social history reviewed and updated as indicated. Interim medical history since our last visit reviewed. Allergies and medications reviewed and updated.  Review of Systems  Constitutional: Negative for fever or weight change.  Respiratory: Negative for cough and shortness of breath.   Cardiovascular: Negative for chest pain or palpitations.  Gastrointestinal: Negative for abdominal pain, no bowel changes.  Musculoskeletal: Negative for gait problem or joint swelling.  Skin: Negative for rash.  Neurological: Negative for dizziness , positive for headache.  No other specific complaints in a complete review of systems (except as listed in HPI above).      Objective:     BP 110/78   Pulse 98   Temp 98.1 F (36.7 C) (Oral)   Resp 18   Ht 4' 10.25" (1.48 m)   Wt 143 lb 6.4 oz (65 kg)   SpO2 98%   BMI 29.71 kg/m   Wt Readings from Last 3 Encounters:  11/26/21 143 lb 6.4 oz (65 kg)  06/12/20 124 lb (56.2 kg)  04/17/20 130 lb (59 kg)    Physical Exam  Constitutional: Patient appears well-developed and well-nourished.  No distress.  HEENT: head atraumatic, normocephalic, pupils equal and reactive to light, neck supple Cardiovascular: Normal rate, regular rhythm and normal heart sounds.  No murmur heard. No BLE edema. Pulmonary/Chest: Effort normal and breath sounds normal. No respiratory distress. Abdominal: Soft.  There is no tenderness. Psychiatric: Patient has a normal mood and affect. behavior is normal. Judgment and thought content normal.     Assessment & Plan:   Problem List Items Addressed This Visit       Cardiovascular and Mediastinum   Ophthalmoplegic migraine, not intractable - Primary    Patient is currently seeing neurology for her migraines.  She is currently taking Maxalt for when she has a migraine.  She states she has 1 once a week.  Continue with current treatment plan.      Relevant Orders   Ambulatory referral to Neurology     Genitourinary  Bladder prolapse, female, acquired    Patient reports that her bladder pleural labs has improved since physical therapy however she can still tell.  She occasionally does have pain.  Placing referral for GYN for her to establish care.  She was seeing a GYN in [place] and needs one closer to her new home.      Relevant Orders   Ambulatory referral to Gynecology     Other   B12 deficiency anemia    He is not currently on supplementation.  We will get labs today.      Relevant Orders   CBC with Differential/Platelet   Vitamin B12   Vitamin D deficiency    She is not currently on supplementation.  We will get labs.      Relevant Orders   VITAMIN D 25 Hydroxy (Vit-D  Deficiency, Fractures)   Other Visit Diagnoses     Encounter for hepatitis C screening test for low risk patient       Relevant Orders   Hepatitis C antibody   Encounter to establish care       Schedule l CPE.   Relevant Orders   CBC with Differential/Platelet   COMPLETE METABOLIC PANEL WITH GFR   Hemoglobin A1c   Hepatitis C antibody   Vitamin B12   VITAMIN D 25 Hydroxy (Vit-D Deficiency, Fractures)   Screening for diabetes mellitus       Relevant Orders   COMPLETE METABOLIC PANEL WITH GFR   Hemoglobin A1c   Women's annual routine gynecological examination       Referral placed to GYN to establish care.   Relevant Orders   Ambulatory referral to Gynecology        Follow up plan: Return in about 6 months (around 05/29/2022) for follow up.

## 2021-11-26 NOTE — Assessment & Plan Note (Signed)
He is not currently on supplementation.  We will get labs today.

## 2021-11-26 NOTE — Assessment & Plan Note (Signed)
Patient is currently seeing neurology for her migraines.  She is currently taking Maxalt for when she has a migraine.  She states she has 1 once a week.  Continue with current treatment plan.

## 2021-11-26 NOTE — Assessment & Plan Note (Signed)
She is not currently on supplementation.  We will get labs.

## 2021-11-26 NOTE — Assessment & Plan Note (Signed)
Patient reports that her bladder pleural labs has improved since physical therapy however she can still tell.  She occasionally does have pain.  Placing referral for GYN for her to establish care.  She was seeing a GYN in [place] and needs one closer to her new home.

## 2021-12-01 ENCOUNTER — Other Ambulatory Visit: Payer: Self-pay | Admitting: Nurse Practitioner

## 2021-12-01 ENCOUNTER — Other Ambulatory Visit (HOSPITAL_COMMUNITY): Payer: Self-pay

## 2021-12-01 DIAGNOSIS — E559 Vitamin D deficiency, unspecified: Secondary | ICD-10-CM

## 2021-12-01 LAB — HEMOGLOBIN A1C
Hgb A1c MFr Bld: 4.9 % of total Hgb (ref <5.7)
Mean Plasma Glucose: 94 mg/dL
eAG (mmol/L): 5.2 mmol/L

## 2021-12-01 LAB — CBC WITH DIFFERENTIAL/PLATELET
Absolute Monocytes: 402 cells/uL (ref 200–950)
Basophils Absolute: 29 cells/uL (ref 0–200)
Basophils Relative: 0.4 %
Eosinophils Absolute: 139 cells/uL (ref 15–500)
Eosinophils Relative: 1.9 %
HCT: 38.1 % (ref 35.0–45.0)
Hemoglobin: 13.1 g/dL (ref 11.7–15.5)
Lymphs Abs: 1555 cells/uL (ref 850–3900)
MCH: 32 pg (ref 27.0–33.0)
MCHC: 34.4 g/dL (ref 32.0–36.0)
MCV: 92.9 fL (ref 80.0–100.0)
MPV: 10.8 fL (ref 7.5–12.5)
Monocytes Relative: 5.5 %
Neutro Abs: 5176 cells/uL (ref 1500–7800)
Neutrophils Relative %: 70.9 %
Platelets: 219 10*3/uL (ref 140–400)
RBC: 4.1 10*6/uL (ref 3.80–5.10)
RDW: 11.9 % (ref 11.0–15.0)
Total Lymphocyte: 21.3 %
WBC: 7.3 10*3/uL (ref 3.8–10.8)

## 2021-12-01 LAB — COMPLETE METABOLIC PANEL WITH GFR
AG Ratio: 2.5 (calc) (ref 1.0–2.5)
ALT: 7 U/L (ref 6–29)
AST: 8 U/L — ABNORMAL LOW (ref 10–30)
Albumin: 4.2 g/dL (ref 3.6–5.1)
Alkaline phosphatase (APISO): 39 U/L (ref 31–125)
BUN: 10 mg/dL (ref 7–25)
CO2: 24 mmol/L (ref 20–32)
Calcium: 9 mg/dL (ref 8.6–10.2)
Chloride: 106 mmol/L (ref 98–110)
Creat: 0.66 mg/dL (ref 0.50–0.96)
Globulin: 1.7 g/dL (calc) — ABNORMAL LOW (ref 1.9–3.7)
Glucose, Bld: 91 mg/dL (ref 65–99)
Potassium: 4.5 mmol/L (ref 3.5–5.3)
Sodium: 139 mmol/L (ref 135–146)
Total Bilirubin: 0.4 mg/dL (ref 0.2–1.2)
Total Protein: 5.9 g/dL — ABNORMAL LOW (ref 6.1–8.1)
eGFR: 122 mL/min/{1.73_m2} (ref >=60)

## 2021-12-01 LAB — VITAMIN D 25 HYDROXY (VIT D DEFICIENCY, FRACTURES): Vit D, 25-Hydroxy: 9 ng/mL — ABNORMAL LOW (ref 30–100)

## 2021-12-01 LAB — VITAMIN B12: Vitamin B-12: 130 pg/mL — ABNORMAL LOW (ref 200–1100)

## 2021-12-01 LAB — HEPATITIS C ANTIBODY: Hepatitis C Ab: NONREACTIVE

## 2021-12-01 MED ORDER — VITAMIN D (ERGOCALCIFEROL) 1.25 MG (50000 UNIT) PO CAPS
50000.0000 [IU] | ORAL_CAPSULE | ORAL | 3 refills | Status: DC
  Filled 2021-12-01 – 2021-12-18 (×3): qty 8, 56d supply, fill #0
  Filled 2022-02-14: qty 8, 56d supply, fill #1

## 2021-12-09 ENCOUNTER — Other Ambulatory Visit (HOSPITAL_COMMUNITY): Payer: Self-pay

## 2021-12-10 ENCOUNTER — Other Ambulatory Visit (HOSPITAL_COMMUNITY): Payer: Self-pay

## 2021-12-18 ENCOUNTER — Telehealth: Payer: Self-pay | Admitting: Obstetrics & Gynecology

## 2021-12-18 ENCOUNTER — Other Ambulatory Visit: Payer: Self-pay

## 2021-12-18 NOTE — Telephone Encounter (Addendum)
[facility] referring for to establish care. Sch w any provider. I contacted patient via phone. I left voicemail for patient to call back to be scheduled.

## 2021-12-24 ENCOUNTER — Encounter: Payer: Self-pay | Admitting: Obstetrics and Gynecology

## 2021-12-24 NOTE — Telephone Encounter (Signed)
I contacted patient via phone. I left voicemail for patient to call back to be scheduled.  Referral notice to be mail. Contacted referring provider due to multiple attempts tp reach patient have been unsuccessful.

## 2022-02-15 ENCOUNTER — Other Ambulatory Visit: Payer: Self-pay

## 2022-02-24 ENCOUNTER — Encounter: Payer: No Typology Code available for payment source | Admitting: Obstetrics and Gynecology

## 2022-02-26 ENCOUNTER — Other Ambulatory Visit: Payer: Self-pay

## 2022-03-02 ENCOUNTER — Telehealth (INDEPENDENT_AMBULATORY_CARE_PROVIDER_SITE_OTHER): Payer: No Typology Code available for payment source | Admitting: Adult Health

## 2022-03-02 DIAGNOSIS — G43119 Migraine with aura, intractable, without status migrainosus: Secondary | ICD-10-CM

## 2022-03-02 MED ORDER — NURTEC 75 MG PO TBDP: ORAL_TABLET | ORAL | 5 refills | Status: DC

## 2022-03-02 NOTE — Progress Notes (Signed)
PATIENT: Jordan [name] DOB: 06/30/1990  REASON FOR VISIT: follow up HISTORY FROM: patient  Virtual Visit via Video Note  I connected with Silvano Rusk on 03/02/22 at  3:15 PM EST by a video enabled telemedicine application located remotely at [facility] and verified that I am speaking with the correct person using two identifiers who was located at their own home.   I discussed the limitations of evaluation and management by telemedicine and the availability of in person appointments. The patient expressed understanding and agreed to proceed.   PATIENT: Jordan [name] DOB: 14-Dec-1990  REASON FOR VISIT: follow up HISTORY FROM: patient  HISTORY OF PRESENT ILLNESS: Today 03/02/22: Jordan [name] is a 31 year old female with a history of migraine headaches.  She returns today for follow-up. Headaches are 1-2 a week that can be manages with tylenol. Has a true migraine 1-2 a month.  Reports that if she uses Maxalt she has to lay down and go to sleep.  She would like to try another abortive medication  02/24/21:Jordan [name] is a 31 year old female with a history of migraine headaches.  She returns today for follow-up.  She states that she stopped nortriptyline because she felt that it made her mood worse.  She states that she felt more sad.  Felt it also made her sleepy.  She continues to have 2-3 mild headaches a week.  They typically respond well to Tylenol or Advil.  She has severe headaches a week of her menstrual cycle.  She does use Maxalt for severe headaches but reports that it does cause her to feel nauseous.  07/15/20: Jordan [name] is a 31 year old female with a history of migraine headaches.  She returns today for follow-up.  She states that she is no longer breast-feeding.  She has approximately 1-3 headaches a week.  These are typically mild headaches that respond well to Tylenol.  She has approximately 2 severe headaches a month that does not respond to medication.   Her severe headaches usually occur around her menstrual cycle.  In the past nortriptyline and Maxalt have worked well for her.  HISTORY 04/17/20:   Jordan [name] is a 31 year old female with a history of migraine headaches.  She returns today for follow-up.  She is currently breast-feeding.  She states that she is having approximately 2-3 migraines a week.  They typically occur above the left eye.  She does have photophobia and phonophobia as well as nausea but no vomiting.  She is interested in trying preventative medication as long as safe during breast-feeding.  REVIEW OF SYSTEMS: Out of a complete 14 system review of symptoms, the patient complains only of the following symptoms, and all other reviewed systems are negative.  See HPI  ALLERGIES: Allergies  Allergen Reactions   Latex Swelling and Rash    HOME MEDICATIONS: Outpatient Medications Prior to Visit  Medication Sig Dispense Refill   acetaminophen (TYLENOL) 500 MG tablet Take 500 mg by mouth every 8 (eight) hours as needed for mild pain or headache.     amoxicillin-clavulanate (AUGMENTIN) 875-125 MG tablet Take 1 tablet by mouth 2 (two) times daily. 20 tablet 0   rizatriptan (MAXALT) 10 MG tablet Take 1 tablet (10 mg total) by mouth as needed for migraine. May repeat in 2 hours if needed 10 tablet 11   Vitamin D, Ergocalciferol, (DRISDOL) 1.25 MG (50000 UNIT) CAPS capsule Take 1 capsule (50,000 Units total) by mouth every 7 (seven) days. 8 capsule 3  No facility-administered medications prior to visit.    PAST MEDICAL HISTORY: Past Medical History:  Diagnosis Date   Migraines    PROM (premature rupture of membranes) 03/13/2018   Supervision of normal first pregnancy, antepartum 08/08/2017    Nursing Staff Provider Office Location  [facility] Midwife preferred. No students/residents. Dating   LMP and U/S Language  English Anatomy US   normal female, anterior placenta Flu Vaccine  10/19 [employer] Employee Genetic Screen  NIPS: negative   AFP: neg TDaP vaccine   Given 10/8 Hgb A1C or  GTT Early  Third trimester 2 hour GTT wnl Rhogam   NA   LAB RESULTS  Feeding Plan  Breast Blood Type O/Positive/-    PAST SURGICAL HISTORY: Past Surgical History:  Procedure Laterality Date   WISDOM TOOTH EXTRACTION  May 2012    FAMILY HISTORY: Family History  Problem Relation Age of Onset   Hypertension Father    Hyperlipidemia Father    Cancer Maternal Grandmother    Cancer Maternal Grandfather    Cancer Paternal Grandmother     SOCIAL HISTORY: Social History   Socioeconomic History   Marital status: Married    Spouse name: Randall Hiss   Number of children: 2   Years of education: BSN   Highest education level: Not on file  Occupational History   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  Tobacco Use   Smoking status: Never   Smokeless tobacco: Never  Vaping Use   Vaping Use: Never used  Substance and Sexual Activity   Alcohol use: Not Currently   Drug use: No   Sexual activity: Yes    Birth control/protection: None    Comment: Vasectomy  Other Topics Concern   Not on file  Social History Narrative   Nurse at [employer]   Married with 2 children   Caffeine use: 3-4 times per week Sharren [place])   Right handed   Social Determinants of Health   Financial Resource Strain: Not on file  Food Insecurity: Not on file  Transportation Needs: Not on file  Physical Activity: Not on file  Stress: Not on file  Social Connections: Not on file  Intimate Partner Violence: Not on file      PHYSICAL EXAM Generalized: Well developed, in no acute distress   Neurological examination  Mentation: Alert oriented to time, place, history taking. Follows all commands speech and language fluent Cranial nerve II-XII:Extraocular movements were full. Facial symmetry noted.Head turning and shoulder shrug  were normal and symmetric. Motor: Good strength throughout subjectively per patient Sensory: Sensory testing is intact to soft touch on all 4 extremities  subjectively per patient Coordination: Cerebellar testing reveals good finger-nose-finger  Gait and station: Patient is able to stand from a seated position. gait is normal.  Reflexes: UTA  DIAGNOSTIC DATA (LABS, IMAGING, TESTING) - I reviewed patient records, labs, notes, testing and imaging myself where available.  Lab Results  Component Value Date   WBC 7.3 11/30/2021   HGB 13.1 11/30/2021   HCT 38.1 11/30/2021   MCV 92.9 11/30/2021   PLT 219 11/30/2021      Component Value Date/Time   NA 139 11/30/2021 1115   K 4.5 11/30/2021 1115   CL 106 11/30/2021 1115   CO2 24 11/30/2021 1115   GLUCOSE 91 11/30/2021 1115   BUN 10 11/30/2021 1115   CREATININE 0.66 11/30/2021 1115   CALCIUM 9.0 11/30/2021 1115   PROT 5.9 (L) 11/30/2021 1115   AST 8 (L) 11/30/2021 1115   ALT 7  11/30/2021 1115   BILITOT 0.4 11/30/2021 1115   GFRNONAA >60 05/11/2017 1844   GFRAA >60 05/11/2017 1844    Lab Results  Component Value Date   TSH 3.71 12/23/2015      ASSESSMENT AND PLAN 31 y.o. year old female  has a past medical history of Migraines, PROM (premature rupture of membranes) (03/13/2018), and Supervision of normal first pregnancy, antepartum (08/08/2017). here with   1.  Migraine headaches:  --Take Nurtec at the onset of migraine.  1 tablet in 24 hours --If headache frequency increases she was advised to let us know. --Follow-up in 1 year or sooner if needed   Butch Penny, MSN, NP-C 03/02/2022, 3:12 PM [facility] [address], [address] [place], [place] [address] [phone]

## 2022-03-02 NOTE — Patient Instructions (Signed)
Your Plan:  Try nurtec for abortive therapy. Take 1 tab at the onset of migraine. Only 1 tab in 24 hours. If your symptoms worsen or you develop new symptoms please let us know.    Thank you for coming to see [place] at [facility]. I hope we have been able to provide you high quality care today.  You may receive a patient satisfaction survey over the next few weeks. We would appreciate your feedback and comments so that we may continue to improve ourselves and the health of our patients.

## 2022-03-05 ENCOUNTER — Encounter: Payer: Self-pay | Admitting: Adult Health

## 2022-03-09 NOTE — Telephone Encounter (Signed)
Jordan [name] Key: [id number] - PA Case ID: [id number]   PA Nurtec Complete waiting on approval

## 2022-03-09 NOTE — Telephone Encounter (Signed)
Approvedtoday The request has been approved. The authorization is effective from 03/09/2022 to 09/07/2022, as long as the member is enrolled in their current health plan. The request was approved with a quantity restriction. This has been approved for a max daily dosage of 0.6. A written notification letter will follow with additional details.  Nurtec approved will send patient mychart message to make her aware

## 2022-04-21 ENCOUNTER — Encounter: Payer: Self-pay | Admitting: Obstetrics and Gynecology

## 2022-04-21 ENCOUNTER — Ambulatory Visit (INDEPENDENT_AMBULATORY_CARE_PROVIDER_SITE_OTHER): Payer: 59 | Admitting: Obstetrics and Gynecology

## 2022-04-21 VITALS — BP 113/76 | HR 96 | Resp 16 | Ht <= 58 in | Wt 143.0 lb

## 2022-04-21 DIAGNOSIS — Z01411 Encounter for gynecological examination (general) (routine) with abnormal findings: Secondary | ICD-10-CM

## 2022-04-21 DIAGNOSIS — Z01419 Encounter for gynecological examination (general) (routine) without abnormal findings: Secondary | ICD-10-CM

## 2022-04-21 DIAGNOSIS — N814 Uterovaginal prolapse, unspecified: Secondary | ICD-10-CM | POA: Diagnosis not present

## 2022-04-21 DIAGNOSIS — Z0000 Encounter for general adult medical examination without abnormal findings: Secondary | ICD-10-CM

## 2022-04-21 NOTE — Progress Notes (Signed)
GYNECOLOGY ANNUAL PHYSICAL EXAM PROGRESS NOTE  Subjective:    Jordan [name] is a 32 y.o. G28P2002 female who presents for an annual exam.  The patient is sexually active. The patient participates in regular exercise: no. Has the patient ever been transfused or tattooed?: yes. The patient reports that there is not domestic violence in her life.   The patient has the following complaints today.  She has concerns about bladder prolapse. She noticed it after she had her daughter in 2019 and again after she had her son. She has tried physical therapy in the past but did not notice much improvement.  Reports she skipped a cycle in December but has had one this month.    Menstrual History: Menarche age: 76 Patient's last menstrual period was 04/14/2021 (exact date). Period Cycle (Days): 26 Period Duration (Days): 5 Period Pattern: (!) Irregular Menstrual Flow: Moderate Menstrual Control: Maxi pad Menstrual Control Change Freq (Hours): 2-3 Dysmenorrhea: None   Gynecologic History:  Contraception: vasectomy History of STI's: Denies  Last Pap: 06/12/2020. Results were: normal.  Denies h/o abnormal pap smears.    Upstream - 04/21/22 1516       Pregnancy Intention Screening   Does the patient want to become pregnant in the next year? No    Does the patient's partner want to become pregnant in the next year? No    Would the patient like to discuss contraceptive options today? No      Contraception Wrap Up   Current Method Vasectomy    End Method Vasectomy    Contraception Counseling Provided No            The pregnancy intention screening data noted above was reviewed. Potential methods of contraception were discussed. The patient elected to proceed with Vasectomy.     Upstream - 04/21/22 1516       Pregnancy Intention Screening   Does the patient want to become pregnant in the next year? No    Does the patient's partner want to become pregnant in the next year? No     Would the patient like to discuss contraceptive options today? No      Contraception Wrap Up   Current Method Vasectomy    End Method Vasectomy    Contraception Counseling Provided No            The pregnancy intention screening data noted above was reviewed. Potential methods of contraception were discussed. The patient elected to proceed with Vasectomy.   OB History  Gravida Para Term Preterm AB Living  2 2 2  0 0 2  SAB IAB Ectopic Multiple Live Births  0 0 0 0 2    # Outcome Date GA Lbr Len/2nd Weight Sex Delivery Anes PTL Lv  2 Term 06/11/19 [redacted]w[redacted]d 08:22 / 00:17 7 lb 7.4 oz (3.385 kg) M Vag-Spont EPI  LIV     Name: AL, GAGEN     Apgar1: 7  Apgar5: 9  1 Term 03/14/18 [redacted]w[redacted]d 31:44 / 01:46 7 lb 0.9 oz (3.2 kg) F Vag-Spont EPI  LIV     Name: Cyndie Chime     Apgar1: 9  Apgar5: 9    Past Medical History:  Diagnosis Date   Migraines    PROM (premature rupture of membranes) 03/13/2018   Supervision of normal first pregnancy, antepartum 08/08/2017    Nursing Staff Provider Office Location  [facility] Midwife preferred. No students/residents. Dating   LMP and U/S Language  English Anatomy US  normal female, anterior placenta Flu Vaccine  10/19 [facility] Employee Genetic Screen  NIPS: negative  AFP: neg TDaP vaccine   Given 10/8 Hgb A1C or  GTT Early  Third trimester 2 hour GTT wnl Rhogam   NA   LAB RESULTS  Feeding Plan  Breast Blood Type O/Positive/-    Past Surgical History:  Procedure Laterality Date   WISDOM TOOTH EXTRACTION  May 2012    Family History  Problem Relation Age of Onset   Hypertension Father    Hyperlipidemia Father    Cancer Maternal Grandmother    Cancer Maternal Grandfather    Cancer Paternal Grandmother     Social History   Socioeconomic History   Marital status: Married    Spouse name: Randall Hiss   Number of children: 2   Years of education: BSN   Highest education level: Not on file  Occupational History   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   Tobacco Use   Smoking status: Never   Smokeless tobacco: Never  Vaping Use   Vaping Use: Never used  Substance and Sexual Activity   Alcohol use: Not Currently   Drug use: No   Sexual activity: Yes    Birth control/protection: None    Comment: Vasectomy  Other Topics Concern   Not on file  Social History Narrative   Nurse at [employer]   Married with 2 children   Caffeine use: 3-4 times per week [place])   Right handed   Social Determinants of Health   Financial Resource Strain: Not on file  Food Insecurity: Not on file  Transportation Needs: Not on file  Physical Activity: Not on file  Stress: Not on file  Social Connections: Not on file  Intimate Partner Violence: Not on file    Current Outpatient Medications on File Prior to Visit  Medication Sig Dispense Refill   acetaminophen (TYLENOL) 500 MG tablet Take 500 mg by mouth every 8 (eight) hours as needed for mild pain or headache.     amoxicillin-clavulanate (AUGMENTIN) 875-125 MG tablet Take 1 tablet by mouth 2 (two) times daily. 20 tablet 0   Rimegepant Sulfate (NURTEC) 75 MG TBDP Take 1 tab at the onset of a migraine. Only 1 tab in 24 hours. 15 tablet 5   Vitamin D, Ergocalciferol, (DRISDOL) 1.25 MG (50000 UNIT) CAPS capsule Take 1 capsule (50,000 Units total) by mouth every 7 (seven) days. 8 capsule 3   No current facility-administered medications on file prior to visit.    Allergies  Allergen Reactions   Latex Swelling and Rash     Review of Systems Constitutional: negative for chills, fatigue, fevers and sweats Eyes: negative for irritation, redness and visual disturbance Ears, nose, mouth, throat, and face: negative for hearing loss, nasal congestion, snoring and tinnitus Respiratory: negative for asthma, cough, sputum Cardiovascular: negative for chest pain, dyspnea, exertional chest pressure/discomfort, irregular heart beat, palpitations and syncope Gastrointestinal: negative for abdominal pain, change  in bowel habits, nausea and vomiting Genitourinary: negative for abnormal menstrual periods, genital lesions, sexual problems and vaginal discharge, dysuria and urinary incontinence. Positive for pelvic prolapse.  Integument/breast: negative for breast lump, breast tenderness and nipple discharge Hematologic/lymphatic: negative for bleeding and easy bruising Musculoskeletal:negative for back pain and muscle weakness Neurological: negative for dizziness, headaches, vertigo and weakness Endocrine: negative for diabetic symptoms including polydipsia, polyuria and skin dryness Allergic/Immunologic: negative for hay fever and urticaria      Objective:  Blood pressure 113/76, pulse 96, resp. rate 16, height 4'  10" (1.473 m), weight 143 lb (64.9 kg), last menstrual period 04/14/2021, not currently breastfeeding. Body mass index is 29.89 kg/m.    General Appearance:    Alert, cooperative, no distress, appears stated age, overweight  Head:    Normocephalic, without obvious abnormality, atraumatic  Eyes:    PERRL, conjunctiva/corneas clear, EOM's intact, both eyes  Ears:    Normal external ear canals, both ears  Nose:   Nares normal, septum midline, mucosa normal, no drainage or sinus tenderness  Throat:   Lips, mucosa, and tongue normal; teeth and gums normal  Neck:   Supple, symmetrical, trachea midline, no adenopathy; thyroid: no enlargement/tenderness/nodules; no carotid bruit or JVD  Back:     Symmetric, no curvature, ROM normal, no CVA tenderness  Lungs:     Clear to auscultation bilaterally, respirations unlabored  Chest Wall:    No tenderness or deformity   Heart:    Regular rate and rhythm, S1 and S2 normal, no murmur, rub or gallop  Breast Exam:    No tenderness, masses, or nipple abnormality  Abdomen:     Soft, non-tender, bowel sounds active all four quadrants, no masses, no organomegaly.    Genitalia:    Pelvic:external genitalia normal, vagina without lesions, discharge, or  tenderness, rectovaginal septum  normal. Cervix normal in appearance, no cervical motion tenderness.  Grade 2 cystocele with uterine prolapse present. no adnexal masses or tenderness.  Uterus normal size, shape, mobile, regular contours, nontender.  Rectal:    Normal external sphincter.  No hemorrhoids appreciated. Internal exam not done.   Extremities:   Extremities normal, atraumatic, no cyanosis or edema  Pulses:   2+ and symmetric all extremities  Skin:   Skin color, texture, turgor normal, no rashes or lesions  Lymph nodes:   Cervical, supraclavicular, and axillary nodes normal  Neurologic:   CNII-XII intact, normal strength, sensation and reflexes throughout   .  Labs:  Lab Results  Component Value Date   WBC 7.3 11/30/2021   HGB 13.1 11/30/2021   HCT 38.1 11/30/2021   MCV 92.9 11/30/2021   PLT 219 11/30/2021    Lab Results  Component Value Date   CREATININE 0.66 11/30/2021   BUN 10 11/30/2021   NA 139 11/30/2021   K 4.5 11/30/2021   CL 106 11/30/2021   CO2 24 11/30/2021    Lab Results  Component Value Date   ALT 7 11/30/2021   AST 8 (L) 11/30/2021   BILITOT 0.4 11/30/2021    Lab Results  Component Value Date   TSH 3.71 12/23/2015     Assessment:   1. Well woman exam with routine gynecological exam   2. Cystocele with uterine descensus      Plan:  Blood tests: none ordered. Up to date. Ordered by PCP. Marland Kitchen Breast self exam technique reviewed and patient encouraged to perform self-exam monthly. Contraception: vasectomy. Discussed healthy lifestyle modifications. Mammogram  :Not age appropriate Pap smear  up to date . Up to date on flu vaccine.  Discussed different treatment options including use of pessary, or suspension procedure.  Given handouts on both options. Can refer to UroGyn for further discussion of options.  Follow up in 1 year for annual exam   Hildred Laser, MD [facility]

## 2022-05-27 NOTE — Progress Notes (Signed)
BP 110/72   Temp 98.2 F (36.8 C) (Oral)   Resp 18   Ht 4' 10.25" (1.48 m)   Wt 142 lb 14.4 oz (64.8 kg)   LMP 04/09/2022   SpO2 98%   BMI 29.61 kg/m    Subjective:    Patient ID: Jordan [name], female    DOB: 1991-01-25, 31 y.o.   MRN: [id number]  HPI: Jordan [name] is a 32 y.o. female  Chief Complaint  Patient presents with   Migraine   Vitamin D Deficiency    Anemia due to b 12 deficiency/vitamin d deficiency:  patient's vitamin B12 was 130 on 11/30/2021. Recommended patient do vitamin b 12 injections but she did not start yet.  Her vitamin d was 9 on 11/30/2021. Sent in vitamin d supplements. Patient reports she did take them and is now taking a prenatal vitamin. Will recheck today.  Discussed if b12 is still low will do injections.   Bladder prolapse: She had been seeing GYN in [place] and was sent to physical therapy.  Referred patient to local GYN, she saw Dr. Marcelline Mates on 04/21/2022. She has an appointment with uroGYN end of April.   Migraines: Patient is currently seeing neurology in [facility] for her migraines.  She is currently taking Maxalt.  Patient states she gets a migraine about once a week.  Patient last saw neurology on 03/02/2022, she was prescribed nurtec.  Patient reports she is doing well since starting nurtec.      05/28/2022   11:19 AM 11/26/2021    2:40 PM 05/13/2017    8:45 AM  Depression screen PHQ 2/9  Decreased Interest 1 0 0  Down, Depressed, Hopeless 1 0 0  PHQ - 2 Score 2 0 0  Altered sleeping 0    Tired, decreased energy 1    Change in appetite 0    Feeling bad or failure about yourself  1    Trouble concentrating 0    Moving slowly or fidgety/restless 0    Suicidal thoughts 0    PHQ-9 Score 4    Difficult doing work/chores Somewhat difficult       Relevant past medical, surgical, family and social history reviewed and updated as indicated. Interim medical history since our last visit reviewed. Allergies and medications reviewed and  updated.  Review of Systems  Constitutional: Negative for fever or weight change.  Respiratory: Negative for cough and shortness of breath.   Cardiovascular: Negative for chest pain or palpitations.  Gastrointestinal: Negative for abdominal pain, no bowel changes.  Musculoskeletal: Negative for gait problem or joint swelling.  Skin: Negative for rash.  Neurological: Negative for dizziness , positive for headache.  No other specific complaints in a complete review of systems (except as listed in HPI above).      Objective:    BP 110/72   Temp 98.2 F (36.8 C) (Oral)   Resp 18   Ht 4' 10.25" (1.48 m)   Wt 142 lb 14.4 oz (64.8 kg)   LMP 04/09/2022   SpO2 98%   BMI 29.61 kg/m   Wt Readings from Last 3 Encounters:  05/28/22 142 lb 14.4 oz (64.8 kg)  04/21/22 143 lb (64.9 kg)  11/26/21 143 lb 6.4 oz (65 kg)    Physical Exam  Constitutional: Patient appears well-developed and well-nourished.  No distress.  HEENT: head atraumatic, normocephalic, pupils equal and reactive to light, neck supple Cardiovascular: Normal rate, regular rhythm and normal heart sounds.  No murmur heard.  No BLE edema. Pulmonary/Chest: Effort normal and breath sounds normal. No respiratory distress. Abdominal: Soft.  There is no tenderness. Psychiatric: Patient has a normal mood and affect. behavior is normal. Judgment and thought content normal.     Assessment & Plan:   Problem List Items Addressed This Visit       Cardiovascular and Mediastinum   Ophthalmoplegic migraine, not intractable    Continue nurtec as needed, follow up with neurology        Genitourinary   Bladder prolapse, female, acquired    Has appointment with Urogyn end of April         Other   B12 deficiency anemia    Currently taking prenatal vitamin, will check level      Relevant Orders   Vitamin B12   CBC with Differential/Platelet   Vitamin D deficiency - Primary    Currently taking prenatal vitamin, will check  level      Relevant Orders   VITAMIN D 25 Hydroxy (Vit-D Deficiency, Fractures)   Other Visit Diagnoses     Screening for diabetes mellitus       Relevant Orders   COMPLETE METABOLIC PANEL WITH GFR        Follow up plan: Return in about 6 months (around 11/26/2022) for cpe.

## 2022-05-28 ENCOUNTER — Other Ambulatory Visit: Payer: Self-pay

## 2022-05-28 ENCOUNTER — Encounter: Payer: Self-pay | Admitting: Nurse Practitioner

## 2022-05-28 ENCOUNTER — Ambulatory Visit (INDEPENDENT_AMBULATORY_CARE_PROVIDER_SITE_OTHER): Payer: 59 | Admitting: Nurse Practitioner

## 2022-05-28 VITALS — BP 110/72 | Temp 98.2°F | Resp 18 | Ht 58.25 in | Wt 142.9 lb

## 2022-05-28 DIAGNOSIS — E559 Vitamin D deficiency, unspecified: Secondary | ICD-10-CM

## 2022-05-28 DIAGNOSIS — D519 Vitamin B12 deficiency anemia, unspecified: Secondary | ICD-10-CM | POA: Diagnosis not present

## 2022-05-28 DIAGNOSIS — Z131 Encounter for screening for diabetes mellitus: Secondary | ICD-10-CM | POA: Diagnosis not present

## 2022-05-28 DIAGNOSIS — G43B0 Ophthalmoplegic migraine, not intractable: Secondary | ICD-10-CM | POA: Diagnosis not present

## 2022-05-28 DIAGNOSIS — N8110 Cystocele, unspecified: Secondary | ICD-10-CM

## 2022-05-28 NOTE — Assessment & Plan Note (Signed)
Continue nurtec as needed, follow up with neurology

## 2022-05-28 NOTE — Assessment & Plan Note (Signed)
Currently taking prenatal vitamin, will check level

## 2022-05-28 NOTE — Assessment & Plan Note (Signed)
Has appointment with Urogyn end of April

## 2022-05-29 LAB — COMPLETE METABOLIC PANEL WITH GFR
AG Ratio: 2 (calc) (ref 1.0–2.5)
ALT: 8 U/L (ref 6–29)
AST: 12 U/L (ref 10–30)
Albumin: 4 g/dL (ref 3.6–5.1)
Alkaline phosphatase (APISO): 44 U/L (ref 31–125)
BUN: 11 mg/dL (ref 7–25)
CO2: 26 mmol/L (ref 20–32)
Calcium: 9.2 mg/dL (ref 8.6–10.2)
Chloride: 106 mmol/L (ref 98–110)
Creat: 0.63 mg/dL (ref 0.50–0.97)
Globulin: 2 g/dL (calc) (ref 1.9–3.7)
Glucose, Bld: 61 mg/dL — ABNORMAL LOW (ref 65–99)
Potassium: 4.2 mmol/L (ref 3.5–5.3)
Sodium: 142 mmol/L (ref 135–146)
Total Bilirubin: 0.4 mg/dL (ref 0.2–1.2)
Total Protein: 6 g/dL — ABNORMAL LOW (ref 6.1–8.1)
eGFR: 122 mL/min/{1.73_m2} (ref >=60)

## 2022-05-29 LAB — CBC WITH DIFFERENTIAL/PLATELET
Absolute Monocytes: 559 cells/uL (ref 200–950)
Basophils Absolute: 26 cells/uL (ref 0–200)
Basophils Relative: 0.3 %
Eosinophils Absolute: 120 cells/uL (ref 15–500)
Eosinophils Relative: 1.4 %
HCT: 36.8 % (ref 35.0–45.0)
Hemoglobin: 12.4 g/dL (ref 11.7–15.5)
Lymphs Abs: 2434 cells/uL (ref 850–3900)
MCH: 31.1 pg (ref 27.0–33.0)
MCHC: 33.7 g/dL (ref 32.0–36.0)
MCV: 92.2 fL (ref 80.0–100.0)
MPV: 10.9 fL (ref 7.5–12.5)
Monocytes Relative: 6.5 %
Neutro Abs: 5461 cells/uL (ref 1500–7800)
Neutrophils Relative %: 63.5 %
Platelets: 260 10*3/uL (ref 140–400)
RBC: 3.99 10*6/uL (ref 3.80–5.10)
RDW: 12 % (ref 11.0–15.0)
Total Lymphocyte: 28.3 %
WBC: 8.6 10*3/uL (ref 3.8–10.8)

## 2022-05-29 LAB — VITAMIN B12: Vitamin B-12: 207 pg/mL (ref 200–1100)

## 2022-05-29 LAB — VITAMIN D 25 HYDROXY (VIT D DEFICIENCY, FRACTURES): Vit D, 25-Hydroxy: 28 ng/mL — ABNORMAL LOW (ref 30–100)

## 2022-07-30 ENCOUNTER — Encounter: Payer: Self-pay | Admitting: Obstetrics and Gynecology

## 2022-07-30 ENCOUNTER — Ambulatory Visit: Payer: 59 | Admitting: Obstetrics and Gynecology

## 2022-07-30 VITALS — BP 120/82 | HR 94 | Ht 59.1 in | Wt 143.2 lb

## 2022-07-30 DIAGNOSIS — R350 Frequency of micturition: Secondary | ICD-10-CM | POA: Diagnosis not present

## 2022-07-30 DIAGNOSIS — N8110 Cystocele, unspecified: Secondary | ICD-10-CM

## 2022-07-30 DIAGNOSIS — N812 Incomplete uterovaginal prolapse: Secondary | ICD-10-CM | POA: Diagnosis not present

## 2022-07-30 DIAGNOSIS — N3941 Urge incontinence: Secondary | ICD-10-CM | POA: Diagnosis not present

## 2022-07-30 DIAGNOSIS — N393 Stress incontinence (female) (male): Secondary | ICD-10-CM | POA: Diagnosis not present

## 2022-07-30 LAB — POCT URINALYSIS DIPSTICK
Bilirubin, UA: NEGATIVE
Blood, UA: NEGATIVE
Glucose, UA: NEGATIVE
Ketones, UA: NEGATIVE
Leukocytes, UA: NEGATIVE
Nitrite, UA: NEGATIVE
Protein, UA: NEGATIVE
Spec Grav, UA: 1.02 (ref 1.010–1.025)
Urobilinogen, UA: 0.2 E.U./dL
pH, UA: 7.5 (ref 5.0–8.0)

## 2022-07-30 NOTE — Addendum Note (Signed)
Addended by: Marguerita Beards on: 07/30/2022 10:49 AM   Modules accepted: Level of Service

## 2022-07-30 NOTE — Addendum Note (Signed)
Addended by: Thressa Sheller on: 07/30/2022 10:48 AM   Modules accepted: Orders
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
## 2022-07-30 NOTE — Patient Instructions (Signed)
You have a stage 2 (out of 4) prolapse.  We discussed the fact that it is not life threatening but there are several treatment options. For treatment of pelvic organ prolapse, we discussed options for management including expectant management, conservative management, and surgical management, such as Kegels, a pessary, pelvic floor physical therapy, and specific surgical procedures.    

## 2022-08-24 NOTE — Progress Notes (Unsigned)
[facility]   Subjective:     Chief Complaint: Pessary Fitting Jordan [name] is a 32 y.o. female is here for pessary fitting.)  History of Present Illness: Jordan [name] is a 32 y.o. female with stage II pelvic organ prolapse who presents today for a pessary fitting.    Past Medical History: Patient  has a past medical history of Migraines, PROM (premature rupture of membranes) (03/13/2018), and Supervision of normal first pregnancy, antepartum (08/08/2017).   Past Surgical History: She  has a past surgical history that includes Wisdom tooth extraction (May 2012).   Medications: She has a current medication list which includes the following prescription(s): nurtec.   Allergies: Patient is allergic to latex.   Social History: Patient  reports that she has never smoked. She has never used smokeless tobacco. She reports that she does not currently use alcohol. She reports that she does not use drugs.      Objective:    BP 100/66   Pulse 76  Gen: No apparent distress, A&O x 3. Pelvic Exam: Normal external female genitalia; Bartholin's and Skene's glands normal in appearance; urethral meatus normal in appearance, no urethral masses or discharge.   Attempted a size 3 ring with support which patient did not feel was supportive enough.   Attempted a size 3 incontinence dish with support that patient felt was too girthy, possibly related to the knob.   Attempted a size 3 Donut which was comfortable to the patient but came out with urination attempt.  Attempted a size #3 cube which was comfortable  A size #3  cube pessary (LOT [id number]) was fitted. It was comfortable, stayed in place with valsalva and was an appropriate size on examination, with one finger fitting between the pessary and the vaginal walls. The patient demonstrated proper removal and replacement. She was able to attempt urination and defecation without pessary expulsion.    Assessment/Plan:     Assessment: Jordan [name] is a 32 y.o. with stage II pelvic organ prolapse who presents for a pessary fitting. Plan: She was fitted with a #3 cube pessary. She will remove at least once weekly . She will use lubricant.   Follow-up in 4 weeks for a pessary check or sooner as needed.  All questions were answered.    Selmer Dominion, NP

## 2022-08-25 ENCOUNTER — Encounter: Payer: Self-pay | Admitting: Obstetrics and Gynecology

## 2022-08-25 ENCOUNTER — Ambulatory Visit: Payer: 59 | Admitting: Obstetrics and Gynecology

## 2022-08-25 VITALS — BP 100/66 | HR 76

## 2022-08-25 DIAGNOSIS — N812 Incomplete uterovaginal prolapse: Secondary | ICD-10-CM | POA: Diagnosis not present

## 2022-08-25 DIAGNOSIS — Z4689 Encounter for fitting and adjustment of other specified devices: Secondary | ICD-10-CM

## 2022-08-25 DIAGNOSIS — N8110 Cystocele, unspecified: Secondary | ICD-10-CM

## 2022-08-25 NOTE — Patient Instructions (Signed)
Use lubricant to place the pessary. Take it out at least once a week or nightly if you are on your period. Wash with gentle soap and water.

## 2022-08-29 ENCOUNTER — Other Ambulatory Visit (HOSPITAL_COMMUNITY): Payer: Self-pay

## 2022-08-29 ENCOUNTER — Telehealth: Payer: Self-pay

## 2022-08-29 NOTE — Telephone Encounter (Signed)
I received notification from [facility] that the PA for Nurtec is due to be renewed-PT has not been seen since the medication was prescribed-please advise-

## 2022-08-31 ENCOUNTER — Encounter: Payer: Self-pay | Admitting: *Deleted

## 2022-08-31 NOTE — Telephone Encounter (Signed)
Tried to call patient but her VM box was full. I sent her a Clinical cytogeneticist message. Will update when I hear back from patient.

## 2022-08-31 NOTE — Telephone Encounter (Signed)
Pharmacy Patient Advocate Encounter   Received notification from [facility] that prior authorization for Nurtec 75MG  dispersible tablets is required/requested.   PA submitted on 08/31/2022 to (ins) [employer] via CoverMyMeds Key or (Medicaid) confirmation # [id number]  Status is pending

## 2022-09-02 ENCOUNTER — Other Ambulatory Visit (HOSPITAL_COMMUNITY): Payer: Self-pay

## 2022-09-02 NOTE — Telephone Encounter (Signed)
Pharmacy Patient Advocate Encounter  Prior Authorization for Nurtec 75MG  dispersible tablets has been approved by [employer] (ins).    PA # PA Case ID #: [id number] Effective dates: 09/08/2022 through 03/07/2023  Copay is $0 per [facility] test claim.

## 2022-09-14 ENCOUNTER — Telehealth (INDEPENDENT_AMBULATORY_CARE_PROVIDER_SITE_OTHER): Payer: 59 | Admitting: Adult Health

## 2022-09-14 DIAGNOSIS — G43109 Migraine with aura, not intractable, without status migrainosus: Secondary | ICD-10-CM | POA: Diagnosis not present

## 2022-09-14 MED ORDER — NURTEC 75 MG PO TBDP: ORAL_TABLET | ORAL | 11 refills | Status: DC

## 2022-09-14 NOTE — Progress Notes (Signed)
PATIENT: Jordan [name] DOB: [birth year]  REASON FOR VISIT: follow up HISTORY FROM: patient  Virtual Visit via Video Note  I connected with [name] on 09/14/22 at  3:00 PM EDT by a video enabled telemedicine application located remotely at [facility] and verified that I am speaking with the correct person using two identifiers who was located at their own home.   I discussed the limitations of evaluation and management by telemedicine and the availability of in person appointments. The patient expressed understanding and agreed [name] proceed.   PATIENT: Jordan [name] DOB: 1990/04/11  REASON FOR VISIT: follow up HISTORY FROM: patient  HISTORY OF PRESENT ILLNESS: Today 09/14/22:  Jordan [name] is a 32 y.o. female with a history of Migraine headaches. Returns today for follow-up. Has a migraine 1-3 times a month. May last a couple of hours-up [name] 1 day. Has weekly mild headaches that she can't take tylenol. Still happy with this management. Does not wish [name] be on a daily medication.     03/02/22: Jordan [name] is a 32 year old female with a history of migraine headaches.  She returns today for follow-up. Headaches are 1-2 a week that can be manages with tylenol. Has a true migraine 1-2 a month.  Reports that if she uses Maxalt she has [name] lay down and go [name] sleep.  She would like [name] try another abortive medication  02/24/21:Jordan [name] is a 32 year old female with a history of migraine headaches.  She returns today for follow-up.  She states that she stopped nortriptyline because she felt that it made her mood worse.  She states that she felt more sad.  Felt it also made her sleepy.  She continues [name] have 2-3 mild headaches a week.  They typically respond well [name] Tylenol or Advil.  She has severe headaches a week of her menstrual cycle.  She does use Maxalt for severe headaches but reports that it does cause her [name] feel nauseous.  07/15/20: Jordan [name] is a  32 year old female with a history of migraine headaches.  She returns today for follow-up.  She states that she is no longer breast-feeding.  She has approximately 1-3 headaches a week.  These are typically mild headaches that respond well [name] Tylenol.  She has approximately 2 severe headaches a month that does not respond [name] medication.  Her severe headaches usually occur around her menstrual cycle.  In the past nortriptyline and Maxalt have worked well for her.  HISTORY 04/17/20:   Jordan [name] is a 32 year old female with a history of migraine headaches.  She returns today for follow-up.  She is currently breast-feeding.  She states that she is having approximately 2-3 migraines a week.  They typically occur above the left eye.  She does have photophobia and phonophobia as well as nausea but no vomiting.  She is interested in trying preventative medication as long as safe during breast-feeding.  REVIEW OF SYSTEMS: Out of a complete 14 system review of symptoms, the patient complains only of the following symptoms, and all other reviewed systems are negative.  See HPI  ALLERGIES: Allergies  Allergen Reactions   Latex Swelling and Rash    HOME MEDICATIONS: Outpatient Medications Prior [name] Visit  Medication Sig Dispense Refill   Rimegepant Sulfate (NURTEC) 75 MG TBDP Take 1 tab at the onset of a migraine. Only 1 tab in 24 hours. 15 tablet 5   No facility-administered medications prior [name] visit.  PAST MEDICAL HISTORY: Past Medical History:  Diagnosis Date   Migraines    PROM (premature rupture of membranes) 03/13/2018   Supervision of normal first pregnancy, antepartum 08/08/2017    Nursing Staff Provider Office Location  [facility] Midwife preferred. No students/residents. Dating   LMP and U/S Language  English Anatomy US   normal female, anterior placenta Flu Vaccine  10/19 [facility] Employee Genetic Screen  NIPS: negative  AFP: neg TDaP vaccine   Given 10/8 Hgb A1C or  GTT Early  Third trimester 2  hour GTT wnl Rhogam   NA   LAB RESULTS  Feeding Plan  Breast Blood Type O/Positive/-    PAST SURGICAL HISTORY: Past Surgical History:  Procedure Laterality Date   WISDOM TOOTH EXTRACTION  May 2012    FAMILY HISTORY: Family History  Problem Relation Age of Onset   Hypertension Father    Hyperlipidemia Father    Cancer Maternal Grandmother    Cancer Maternal Grandfather    Cancer Paternal Grandmother     SOCIAL HISTORY: Social History   Socioeconomic History   Marital status: Married    Spouse name: Minerva Areola   Number of children: 2   Years of education: BSN   Highest education level: Not on file  Occupational History   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  Tobacco Use   Smoking status: Never   Smokeless tobacco: Never  Vaping Use   Vaping Use: Never used  Substance and Sexual Activity   Alcohol use: Not Currently   Drug use: No   Sexual activity: Yes    Birth control/protection: None    Comment: Vasectomy  Other Topics Concern   Not on file  Social History Narrative   Nurse at [employer]   Married with 2 children   Caffeine use: 3-4 times per week Neita Carp)   Right handed   Social Determinants of Health   Financial Resource Strain: Not on file  Food Insecurity: Not on file  Transportation Needs: Not on file  Physical Activity: Not on file  Stress: Not on file  Social Connections: Not on file  Intimate Partner Violence: Not on file      PHYSICAL EXAM Generalized: Well developed, in no acute distress   Neurological examination  Mentation: Alert oriented [name] time, place, history taking. Follows all commands speech and language fluent Cranial nerve II-XII: Facial symmetry noted.  DIAGNOSTIC DATA (LABS, IMAGING, TESTING) - I reviewed patient records, labs, notes, testing and imaging myself where available.  Lab Results  Component Value Date   WBC 8.6 05/28/2022   HGB 12.4 05/28/2022   HCT 36.8 05/28/2022   MCV 92.2 05/28/2022   PLT 260 05/28/2022       Component Value Date/Time   NA 142 05/28/2022 1141   K 4.2 05/28/2022 1141   CL 106 05/28/2022 1141   CO2 26 05/28/2022 1141   GLUCOSE 61 (L) 05/28/2022 1141   BUN 11 05/28/2022 1141   CREATININE 0.63 05/28/2022 1141   CALCIUM 9.2 05/28/2022 1141   PROT 6.0 (L) 05/28/2022 1141   AST 12 05/28/2022 1141   ALT 8 05/28/2022 1141   BILITOT 0.4 05/28/2022 1141   GFRNONAA >60 05/11/2017 1844   GFRAA >60 05/11/2017 1844    Lab Results  Component Value Date   TSH 3.71 12/23/2015      ASSESSMENT AND PLAN 32 y.o. year old female  has a past medical history of Migraines, PROM (premature rupture of membranes) (03/13/2018), and Supervision of normal first pregnancy, antepartum (  08/08/2017). here with   1.  Migraine headaches:  --Take Nurtec at the onset of migraine.  1 tablet in 24 hours --If headache frequency increases she was advised [name] let us know. --Follow-up in 1 year or sooner if needed   Butch Penny, MSN, NP-C 09/14/2022, 2:39 PM [facility] [address] [place], [place] [address] [phone]

## 2022-09-22 ENCOUNTER — Ambulatory Visit: Payer: 59 | Admitting: Obstetrics and Gynecology

## 2022-10-05 ENCOUNTER — Ambulatory Visit: Payer: 59 | Admitting: Obstetrics and Gynecology

## 2022-10-27 ENCOUNTER — Telehealth: Payer: 59 | Admitting: Physician Assistant

## 2022-10-27 DIAGNOSIS — J029 Acute pharyngitis, unspecified: Secondary | ICD-10-CM

## 2022-10-28 ENCOUNTER — Other Ambulatory Visit (HOSPITAL_COMMUNITY): Payer: Self-pay

## 2022-10-28 NOTE — Progress Notes (Signed)
I have spent 5 minutes in review of e-visit questionnaire, review and updating patient chart, medical decision making and response to patient.   William Cody Martin, PA-C    

## 2022-10-28 NOTE — Progress Notes (Signed)

## 2022-11-01 ENCOUNTER — Encounter: Payer: Self-pay | Admitting: Obstetrics and Gynecology

## 2022-11-01 ENCOUNTER — Ambulatory Visit: Payer: 59 | Admitting: Obstetrics and Gynecology

## 2022-11-01 VITALS — BP 115/81 | HR 83

## 2022-11-01 DIAGNOSIS — N393 Stress incontinence (female) (male): Secondary | ICD-10-CM | POA: Diagnosis not present

## 2022-11-01 DIAGNOSIS — N812 Incomplete uterovaginal prolapse: Secondary | ICD-10-CM | POA: Diagnosis not present

## 2022-11-01 DIAGNOSIS — N8110 Cystocele, unspecified: Secondary | ICD-10-CM

## 2022-11-01 DIAGNOSIS — Z4689 Encounter for fitting and adjustment of other specified devices: Secondary | ICD-10-CM

## 2022-11-01 NOTE — Progress Notes (Signed)
[facility]   Subjective:     Chief Complaint:  Chief Complaint  Patient presents with   Pessary Check    Jordan [name] is a 32 y.o. female here for a pessary check. Pt said she feels she has to remove her pessary to have a BM.   History of Present Illness: Jordan [name] is a 32 y.o. female with stage II pelvic organ prolapse who presents for a pessary check. She is using a size #3 cube pessary. The pessary has not been very supportive and she does not wear it often and is taking it out prior to bowel movements. She is not using vaginal estrogen. She denies vaginal bleeding.  Past Medical History: Patient  has a past medical history of Migraines, PROM (premature rupture of membranes) (03/13/2018), and Supervision of normal first pregnancy, antepartum (08/08/2017).   Past Surgical History: She  has a past surgical history that includes Wisdom tooth extraction (May 2012).   Medications: She has a current medication list which includes the following prescription(s): nurtec.   Allergies: Patient is allergic to latex.   Social History: Patient  reports that she has never smoked. She has never used smokeless tobacco. She reports that she does not currently use alcohol. She reports that she does not use drugs.      Objective:    Physical Exam: BP 115/81   Pulse 83   LMP 10/25/2022  Gen: No apparent distress, A&O x 3. Detailed Urogynecologic Evaluation:  Pelvic Exam: Normal external female genitalia; Bartholin's and Skene's glands normal in appearance; urethral meatus normal in appearance, no urethral masses or discharge. The pessary was noted to be mildly dislodged. It was removed and cleaned. Speculum exam revealed no lesions in the vagina. The pessary was not replaced as it was not supportive to patient    Assessment/Plan:    Assessment: Jordan [name] is a 32 y.o. with stage II pelvic organ prolapse here for a pessary check. She is doing well.  Plan: She is  interested in having a surgical repair done and she reports she does not want more children. Will have her meet with Dr. Florian Buff for further surgical planning. Patient does have SUI leakage, will have her consider her options between sling and urethral bulking.  Patient to follow up for surgical planning, no need for cardiac or neuro clearance.

## 2022-11-17 ENCOUNTER — Ambulatory Visit: Payer: 59 | Admitting: Obstetrics and Gynecology

## 2022-11-29 ENCOUNTER — Encounter: Payer: 59 | Admitting: Nurse Practitioner

## 2022-12-03 ENCOUNTER — Ambulatory Visit: Payer: 59 | Admitting: Obstetrics and Gynecology

## 2022-12-03 ENCOUNTER — Encounter: Payer: Self-pay | Admitting: Obstetrics and Gynecology

## 2022-12-03 VITALS — BP 116/75 | HR 61 | Ht 58.78 in | Wt 142.0 lb

## 2022-12-03 DIAGNOSIS — N8110 Cystocele, unspecified: Secondary | ICD-10-CM | POA: Diagnosis not present

## 2022-12-03 DIAGNOSIS — N816 Rectocele: Secondary | ICD-10-CM | POA: Diagnosis not present

## 2022-12-03 NOTE — Progress Notes (Signed)
[facility] Return Visit  SUBJECTIVE  History of Present Illness: Jordan [name] is a 32 y.o. female seen in follow-up for prolapse. She tried a pessary but it was bothersome and she no longer wants to use it. She is interested in proceeding with surgery.   Past Medical History: Patient  has a past medical history of Migraines, PROM (premature rupture of membranes) (03/13/2018), and Supervision of normal first pregnancy, antepartum (08/08/2017).   Past Surgical History: She  has a past surgical history that includes Wisdom tooth extraction (May 2012).   Medications: She has a current medication list which includes the following prescription(s): nurtec.   Allergies: Patient is allergic to latex.   Social History: Patient  reports that she has never smoked. She has never used smokeless tobacco. She reports that she does not currently use alcohol. She reports that she does not use drugs.      OBJECTIVE     Physical Exam: Vitals:   12/03/22 0818  BP: 116/75  Pulse: 61  Weight: 142 lb (64.4 kg)  Height: 4' 10.78" (1.493 m)   Gen: No apparent distress, A&O x 3.  Detailed Urogynecologic Evaluation:  Deferred. Prior exam showed:    0                                            Aa   0                                           Ba   -3                                              C    4                                            Gh   3.5                                            Pb   8                                            tvl    -2                                            Ap   -2                                            Bp   -7  D          ASSESSMENT AND PLAN    Jordan [name] is a 32 y.o. with:  1. Prolapse of anterior vaginal wall   2. Prolapse of posterior vaginal wall    - Will have her undergo urodynamic testing to further evaluate incontinence. Will review results at pre op visit.   Plan for  surgery: Exam under anesthesia, robotic assisted total laparoscopic hysterectomy with bilateral salpingectomy, sacrocolpopexy, possible midurethral sling, cystoscopy, possible perineorrhaphy  - We reviewed the patient's specific anatomic and functional findings, with the assistance of diagrams, and together finalized the above procedure. The planned surgical procedures were discussed along with the surgical risks outlined below, which were also provided on a detailed handout. Additional treatment options including expectant management, conservative management, medical management were discussed where appropriate.  We reviewed the benefits and risks of each treatment option.   General Surgical Risks: For all procedures, there are risks of bleeding, infection, damage to surrounding organs including but not limited to bowel, bladder, blood vessels, ureters and nerves, and need for further surgery if an injury were to occur. These risks are all low with minimally invasive surgery.   There are risks of numbness and weakness at any body site or buttock/rectal pain.  It is possible that baseline pain can be worsened by surgery, either with or without mesh. If surgery is vaginal, there is also a low risk of possible conversion to laparoscopy or open abdominal incision where indicated. Very rare risks include blood transfusion, blood clot, heart attack, pneumonia, or death.   There is also a risk of short-term postoperative urinary retention with need to use a catheter. About half of patients need to go home from surgery with a catheter, which is then later removed in the office. The risk of long-term need for a catheter is very low. There is also a risk of worsening of overactive bladder.   Sling: The effectiveness of a midurethral vaginal mesh sling is approximately 85%, and thus, there will be times when you may leak urine after surgery, especially if your bladder is full or if you have a strong cough. There is  a balance between making the sling tight enough to treat your leakage but not too tight so that you have long-term difficulty emptying your bladder. A mesh sling will not directly treat overactive bladder/urge incontinence and may worsen it.  There is an FDA safety notification on vaginal mesh procedures for prolapse but NOT mesh slings. We have extensive experience and training with mesh placement and we have close postoperative follow up to identify any potential complications from mesh. It is important to realize that this mesh is a permanent implant that cannot be easily removed. There are rare risks of mesh exposure (2-4%), pain with intercourse (0-7%), and infection (<1%). The risk of mesh exposure if more likely in a woman with risks for poor healing (prior radiation, poorly controlled diabetes, or immunocompromised). The risk of new or worsened chronic pain after mesh implant is more common in women with baseline chronic pain and/or poorly controlled anxiety or depression. Approximately 2-4% of patients will experience longer-term post-operative voiding dysfunction that may require surgical revision of the sling. We also reviewed that postoperatively, her stream may not be as strong as before surgery.   Prolapse (with or without mesh): Risk factors for surgical failure  include things that put pressure on your pelvis and the surgical repair, including obesity, chronic cough, and heavy lifting or straining (including lifting  children or adults, straining on the toilet, or lifting heavy objects such as furniture or anything weighing >25 lbs. Risks of recurrence is 20-30% with vaginal native tissue repair and a less than 10% with sacrocolpopexy with mesh.    Sacrocolpopexy: Mesh implants may provide more prolapse support, but do have some unique risks to consider. It is important to understand that mesh is permanent and cannot be easily removed. Risks of abdominal sacrocolpopexy mesh include mesh exposure  (~3-6%), painful intercourse (recent studies show lower rates after surgery compared to before, with ~5-8% risk of new onset), and very rare risks of bowel or bladder injury or infection (<1%). The risk of mesh exposure is more likely in a woman with risks for poor healing (prior radiation, poorly controlled diabetes, or immunocompromised). The risk of new or worsened chronic pain after mesh implant is more common in women with baseline chronic pain and/or poorly controlled anxiety or depression. There is an FDA safety notification on vaginal mesh procedures for prolapse but NOT abdominal mesh procedures and therefore does not apply to your surgery. We have extensive experience and training with mesh placement and we have close postoperative follow up to identify any potential complications from mesh.    - For preop Visit:  She is required to have a visit within 30 days of her surgery.    - Medical clearance: not required  - Anticoagulant use: No - Medicaid Hysterectomy form: No - Accepts blood transfusion: No - Expected length of stay: outpatient  Request sent for surgery scheduling.   Marguerita Beards, MD

## 2022-12-07 NOTE — Progress Notes (Signed)
Name: Jordan [name]   MRN: [id number]    DOB: 09/14/1990   Date:12/10/2022       Progress Note  Subjective  Chief Complaint  Chief Complaint  Patient presents with  . Annual Exam    HPI  Patient presents for annual CPE and new problem  Depression/anxiety: she is currently in therapy and her therapist was concerned patient was depressed. Her PHQ9 and GAD scores are positive. Discussed options will start lexapro 10 mg daily.  Follow up in 4 weeks.     12/10/2022   10:20 AM 05/28/2022   11:20 AM  GAD 7 : Generalized Anxiety Score  Nervous, Anxious, on Edge 2 0  Control/stop worrying 2 0  Worry too much - different things 3 0  Trouble relaxing 3 0  Restless 0 0  Easily annoyed or irritable 3 0  Afraid - awful might happen 1 0  Total GAD 7 Score 14 0  Anxiety Difficulty  Not difficult at all      12/10/2022   10:11 AM 12/10/2022    9:52 AM 05/28/2022   11:19 AM 11/26/2021    2:40 PM 05/13/2017    8:45 AM  Depression screen PHQ 2/9  Decreased Interest 3 0 1 0 0  Down, Depressed, Hopeless 2 0 1 0 0  PHQ - 2 Score 5 0 2 0 0  Altered sleeping 2  0    Tired, decreased energy 3  1    Change in appetite 0  0    Feeling bad or failure about yourself  3  1    Trouble concentrating 1  0    Moving slowly or fidgety/restless 0  0    Suicidal thoughts 2  0    PHQ-9 Score 16  4    Difficult doing work/chores Somewhat difficult  Somewhat difficult         Diet: Regular, she avoids red meat and pork ,but otherwise well balanced diet Exercise: None, recommend 150 min of physical activity weekly   Last Eye Exam:12/2021 scheduled for 12/14/2022 Last Dental Exam: June 2024  Flowsheet Row [facility] Visit from 12/10/2022 in [facility]  AUDIT-C Score 0      Depression: Phq 9 is  positive    12/10/2022   10:11 AM 12/10/2022    9:52 AM 05/28/2022   11:19 AM 11/26/2021    2:40 PM 05/13/2017    8:45 AM  Depression screen PHQ 2/9  Decreased Interest 3 0 1 0 0  Down,  Depressed, Hopeless 2 0 1 0 0  PHQ - 2 Score 5 0 2 0 0  Altered sleeping 2  0    Tired, decreased energy 3  1    Change in appetite 0  0    Feeling bad or failure about yourself  3  1    Trouble concentrating 1  0    Moving slowly or fidgety/restless 0  0    Suicidal thoughts 2  0    PHQ-9 Score 16  4    Difficult doing work/chores Somewhat difficult  Somewhat difficult     Hypertension: BP Readings from Last 3 Encounters:  12/10/22 106/72  12/03/22 116/75  11/01/22 115/81   Obesity: Wt Readings from Last 3 Encounters:  12/10/22 141 lb (64 kg)  12/03/22 142 lb (64.4 kg)  07/30/22 143 lb 3.2 oz (65 kg)   BMI Readings from Last 3 Encounters:  12/10/22 29.47 kg/m  12/03/22 28.90 kg/m  07/30/22 28.83 kg/m     Vaccines:  HPV: up to at age 55 , ask insurance if age between 74-45  Shingrix: 57-64 yo and ask insurance if covered when patient above 60 yo Pneumonia:  educated and discussed with patient. Flu:  educated and discussed with patient.     Hep C Screening: completed STD testing and prevention (HIV/chl/gon/syphilis): completed Intimate partner violence: negative screen  Sexual History : yes,  vasectomy Menstrual History/LMP/Abnormal Bleeding: [facility]: 11/20/2022 Discussed importance of follow up if any post-menopausal bleeding: yes  Incontinence Symptoms: negative for symptoms   Breast cancer:  - Last Mammogram: NA - BRCA gene screening: none  Osteoporosis Prevention : Discussed high calcium and vitamin D supplementation, weight bearing exercises Bone density :not applicable   Cervical cancer screening: 06/12/2020  Skin cancer: Discussed monitoring for atypical lesions  Colorectal cancer: NA   Lung cancer:  Low Dose CT Chest recommended if Age 72-80 years, 20 pack-year currently smoking OR have quit w/in 15years. Patient does not qualify for screen   ECG: 05/25/2017  Advanced Care Planning: A voluntary discussion about advance care planning including the  explanation and discussion of advance directives.  Discussed health care proxy and Living will, and the patient was able to identify a health care proxy as husband Minerva Areola.  Patient does not have a living will and power of attorney of health care   Lipids: No results found for: "CHOL" No results found for: "HDL" No results found for: "LDLCALC" No results found for: "TRIG" No results found for: "CHOLHDL" No results found for: "LDLDIRECT"  Glucose: Glucose, Bld  Date Value Ref Range Status  05/28/2022 61 (L) 65 - 99 mg/dL Final    Comment:    .            Fasting reference interval .   11/30/2021 91 65 - 99 mg/dL Final    Comment:    .            Fasting reference interval .   05/11/2017 97 65 - 99 mg/dL Final    Patient Active Problem List   Diagnosis Date Noted  . Mild episode of recurrent major depressive disorder (HCC) 12/10/2022  . Anxiety 12/10/2022  . Bladder prolapse, female, acquired 11/26/2021  . Ophthalmoplegic migraine, not intractable 10/08/2016  . B12 deficiency anemia 11/12/2011  . Vitamin D deficiency 11/12/2011    Past Surgical History:  Procedure Laterality Date  . WISDOM TOOTH EXTRACTION  May 2012    Family History  Problem Relation Age of Onset  . Hypertension Father   . Hyperlipidemia Father   . Cancer Maternal Grandmother   . Cancer Maternal Grandfather   . Cancer Paternal Grandmother     Social History   Socioeconomic History  . Marital status: Married    Spouse name: Minerva Areola  . Number of children: 2  . Years of education: BSN  . Highest education level: Bachelor's degree (e.g., BA, AB, BS)  Occupational History  . 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 Smoking status: Never  . Smokeless tobacco: Never  Vaping Use  . Vaping status: Never Used  Substance and Sexual Activity  . Alcohol use: Not Currently  . Drug use: No  . Sexual activity: Yes    Birth control/protection: None    Comment: Vasectomy  Other Topics Concern  .  Not on file  Social History Narrative   Nurse at [employer]   Married with 2 children   Caffeine use: 3-4  times per week Neita Carp)   Right handed   Social Determinants of Health   Financial Resource Strain: Low Risk  (12/10/2022)   Overall Financial Resource Strain (CARDIA)   . Difficulty of Paying Living Expenses: Not hard at all  Food Insecurity: No Food Insecurity (12/10/2022)   Hunger Vital Sign   . Worried About Programme researcher, broadcasting/film/video in the Last Year: Never true   . Ran Out of Food in the Last Year: Never true  Transportation Needs: No Transportation Needs (12/10/2022)   PRAPARE - Transportation   . Lack of Transportation (Medical): No   . Lack of Transportation (Non-Medical): No  Physical Activity: Inactive (12/10/2022)   Exercise Vital Sign   . Days of Exercise per Week: 0 days   . Minutes of Exercise per Session: 0 min  Stress: No Stress Concern Present (12/10/2022)   [employer] of Occupational Health - Occupational Stress Questionnaire   . Feeling of Stress : Not at all  Recent Concern: Stress - Stress Concern Present (12/04/2022)   [employer] of Occupational Health - Occupational Stress Questionnaire   . Feeling of Stress : To some extent  Social Connections: Moderately Integrated (12/10/2022)   Social Connection and Isolation Panel [NHANES]   . Frequency of Communication with Friends and Family: More than three times a week   . Frequency of Social Gatherings with Friends and Family: More than three times a week   . Attends Religious Services: 1 to 4 times per year   . Active Member of Clubs or Organizations: No   . Attends Banker Meetings: Never   . Marital Status: Married  Recent Concern: Social Connections - Socially Isolated (12/04/2022)   Social Connection and Isolation Panel [NHANES]   . Frequency of Communication with Friends and Family: Never   . Frequency of Social Gatherings with Friends and Family: Never   . Attends Religious Services: Never    . Active Member of Clubs or Organizations: No   . Attends Banker Meetings: Not on file   . Marital Status: Married  Catering manager Violence: Not At Risk (12/10/2022)   Humiliation, Afraid, Rape, and Kick questionnaire   . Fear of Current or Ex-Partner: No   . Emotionally Abused: No   . Physically Abused: No   . Sexually Abused: No     Current Outpatient Medications:  .  escitalopram (LEXAPRO) 10 MG tablet, Take 1 tablet (10 mg total) by mouth daily., Disp: 30 tablet, Rfl: 0 .  Rimegepant Sulfate (NURTEC) 75 MG TBDP, Take 1 tab at the onset of a migraine. Only 1 tab in 24 hours., Disp: 15 tablet, Rfl: 11  Allergies  Allergen Reactions  . Latex Swelling and Rash     ROS  Constitutional: Negative for fever or weight change.  Respiratory: Negative for cough and shortness of breath.   Cardiovascular: Negative for chest pain or palpitations.  Gastrointestinal: Negative for abdominal pain, no bowel changes.  Musculoskeletal: Negative for gait problem or joint swelling.  Skin: Negative for rash.  Neurological: Negative for dizziness or headache.  No other specific complaints in a complete review of systems (except as listed in HPI above).   Objective  Vitals:   12/10/22 0950  BP: 106/72  Pulse: 100  Resp: 16  Temp: 97.6 F (36.4 C)  TempSrc: Oral  SpO2: 94%  Weight: 141 lb (64 kg)  Height: 4\' 10"  (1.473 m)    Body mass index is 29.47 kg/m.  Physical Exam  Constitutional: Patient appears well-developed and well-nourished. No distress.  HENT: Head: Normocephalic and atraumatic. Ears: B TMs ok, no erythema or effusion; Nose: Nose normal. Mouth/Throat: Oropharynx is clear and moist. No oropharyngeal exudate.  Eyes: Conjunctivae and EOM are normal. Pupils are equal, round, and reactive to light. No scleral icterus.  Neck: Normal range of motion. Neck supple. No JVD present. No thyromegaly present.  Cardiovascular: Normal rate, regular rhythm and normal heart  sounds.  No murmur heard. No BLE edema. Pulmonary/Chest: Effort normal and breath sounds normal. No respiratory distress. Abdominal: Soft. Bowel sounds are normal, no distension. There is no tenderness. no masses Musculoskeletal: Normal range of motion, no joint effusions. No gross deformities Neurological: he is alert and oriented to person, place, and time. No cranial nerve deficit. Coordination, balance, strength, speech and gait are normal.  Skin: Skin is warm and dry. No rash noted. No erythema.  Psychiatric: Patient has a normal mood and affect. behavior is normal. Judgment and thought content normal.   No results found for this or any previous visit (from the past 2160 hour(s)).   Fall Risk:    12/10/2022    9:52 AM 04/21/2022    3:16 PM 11/26/2021    2:39 PM 02/15/2017    8:49 AM  Fall Risk   Falls in the past year? 0 0 0 No  Number falls in past yr: 0 0 0   Injury with Fall? 0 0 0   Risk for fall due to :  No Fall Risks    Follow up  Falls evaluation completed Falls evaluation completed      Functional Status Survey: Is the patient deaf or have difficulty hearing?: No Does the patient have difficulty seeing, even when wearing glasses/contacts?: No Does the patient have difficulty concentrating, remembering, or making decisions?: No Does the patient have difficulty walking or climbing stairs?: No Does the patient have difficulty dressing or bathing?: No Does the patient have difficulty doing errands alone such as visiting a doctor's office or shopping?: No   Assessment & Plan  1. Annual physical exam Increase physical activity  2. Mild episode of recurrent major depressive disorder (HCC) Start lexapro follow up in 4 weeks - escitalopram (LEXAPRO) 10 MG tablet; Take 1 tablet (10 mg total) by mouth daily.  Dispense: 30 tablet; Refill: 0  3. Anxiety Start lexapro follow up in 4 weeks  - escitalopram (LEXAPRO) 10 MG tablet; Take 1 tablet (10 mg total) by mouth daily.   Dispense: 30 tablet; Refill: 0   -USPSTF grade A and B recommendations reviewed with patient; age-appropriate recommendations, preventive care, screening tests, etc discussed and encouraged; healthy living encouraged; see AVS for patient education given to patient -Discussed importance of 150 minutes of physical activity weekly, eat two servings of fish weekly, eat one serving of tree nuts ( cashews, pistachios, pecans, almonds.Marland Kitchen) every other day, eat 6 servings of fruit/vegetables daily and drink plenty of water and avoid sweet beverages.   -Reviewed Health Maintenance: Yes.

## 2022-12-10 ENCOUNTER — Other Ambulatory Visit: Payer: Self-pay

## 2022-12-10 ENCOUNTER — Ambulatory Visit (INDEPENDENT_AMBULATORY_CARE_PROVIDER_SITE_OTHER): Payer: 59 | Admitting: Nurse Practitioner

## 2022-12-10 ENCOUNTER — Encounter: Payer: Self-pay | Admitting: Nurse Practitioner

## 2022-12-10 VITALS — BP 106/72 | HR 100 | Temp 97.6°F | Resp 16 | Ht <= 58 in | Wt 141.0 lb

## 2022-12-10 DIAGNOSIS — F419 Anxiety disorder, unspecified: Secondary | ICD-10-CM | POA: Diagnosis not present

## 2022-12-10 DIAGNOSIS — Z0000 Encounter for general adult medical examination without abnormal findings: Secondary | ICD-10-CM

## 2022-12-10 DIAGNOSIS — F330 Major depressive disorder, recurrent, mild: Secondary | ICD-10-CM | POA: Insufficient documentation

## 2022-12-10 MED ORDER — ESCITALOPRAM OXALATE 10 MG PO TABS: 10.0000 mg | ORAL_TABLET | Freq: Every day | ORAL | 0 refills | Status: DC

## 2022-12-21 ENCOUNTER — Encounter: Payer: 59 | Admitting: Obstetrics and Gynecology

## 2022-12-21 DIAGNOSIS — H5213 Myopia, bilateral: Secondary | ICD-10-CM | POA: Diagnosis not present

## 2023-01-07 ENCOUNTER — Ambulatory Visit (INDEPENDENT_AMBULATORY_CARE_PROVIDER_SITE_OTHER): Payer: 59 | Admitting: Nurse Practitioner

## 2023-01-07 ENCOUNTER — Other Ambulatory Visit: Payer: Self-pay

## 2023-01-07 ENCOUNTER — Encounter: Payer: Self-pay | Admitting: Nurse Practitioner

## 2023-01-07 VITALS — BP 110/70 | HR 90 | Temp 98.0°F | Resp 16 | Ht <= 58 in | Wt 136.2 lb

## 2023-01-07 DIAGNOSIS — F330 Major depressive disorder, recurrent, mild: Secondary | ICD-10-CM | POA: Diagnosis not present

## 2023-01-07 DIAGNOSIS — F419 Anxiety disorder, unspecified: Secondary | ICD-10-CM | POA: Diagnosis not present

## 2023-01-07 MED ORDER — ESCITALOPRAM OXALATE 10 MG PO TABS: 10.0000 mg | ORAL_TABLET | Freq: Every day | ORAL | 1 refills | Status: DC

## 2023-01-07 NOTE — Assessment & Plan Note (Signed)
Continue lexapro 10 mg daily, doing well

## 2023-01-07 NOTE — Progress Notes (Signed)
BP 110/70   Pulse 90   Temp 98 F (36.7 C) (Oral)   Resp 16   Ht 4\' 10"  (1.473 m)   Wt 136 lb 3.2 oz (61.8 kg)   LMP 11/20/2022   SpO2 99%   BMI 28.47 kg/m    Subjective:    Patient ID: Jordan [name], female    DOB: Feb 09, 1991, 32 y.o.   MRN: [id number]  HPI: Jordan [name] is a 32 y.o. female  Chief Complaint  Patient presents with   Medical Management of Chronic Issues    4 week recheck   Depression/anxiety: patient reported at last appointment that her therapist was concerned that she was depressed.  Her PHQ9 was positive, started patient on lexapro 10 mg daily.  Patient here for 4 week follow up.  Medication lexapro 10 mg daily Compliant yes Side effects no PHQ9 improved GAD improved Therapy yes      01/07/2023    9:46 AM 12/10/2022   10:11 AM 12/10/2022    9:52 AM 05/28/2022   11:19 AM 11/26/2021    2:40 PM  Depression screen PHQ 2/9  Decreased Interest 0 3 0 1 0  Down, Depressed, Hopeless 0 2 0 1 0  PHQ - 2 Score 0 5 0 2 0  Altered sleeping 1 2  0   Tired, decreased energy 1 3  1    Change in appetite 0 0  0   Feeling bad or failure about yourself  1 3  1    Trouble concentrating 1 1  0   Moving slowly or fidgety/restless 0 0  0   Suicidal thoughts 0 2  0   PHQ-9 Score 4 16  4    Difficult doing work/chores  Somewhat difficult  Somewhat difficult        01/07/2023    9:46 AM 12/10/2022   10:20 AM 05/28/2022   11:20 AM  GAD 7 : Generalized Anxiety Score  Nervous, Anxious, on Edge 0 2 0  Control/stop worrying 0 2 0  Worry too much - different things 0 3 0  Trouble relaxing 0 3 0  Restless 0 0 0  Easily annoyed or irritable 0 3 0  Afraid - awful might happen 0 1 0  Total GAD 7 Score 0 14 0  Anxiety Difficulty Not difficult at all  Not difficult at all     Relevant past medical, surgical, family and social history reviewed and updated as indicated. Interim medical history since our last visit reviewed. Allergies and medications reviewed and  updated.  Review of Systems  Constitutional: Negative for fever or weight change.  Respiratory: Negative for cough and shortness of breath.   Cardiovascular: Negative for chest pain or palpitations.  Gastrointestinal: Negative for abdominal pain, no bowel changes.  Musculoskeletal: Negative for gait problem or joint swelling.  Skin: Negative for rash.  Neurological: Negative for dizziness or headache.  No other specific complaints in a complete review of systems (except as listed in HPI above).      Objective:    BP 110/70   Pulse 90   Temp 98 F (36.7 C) (Oral)   Resp 16   Ht 4\' 10"  (1.473 m)   Wt 136 lb 3.2 oz (61.8 kg)   LMP 11/20/2022   SpO2 99%   BMI 28.47 kg/m   Wt Readings from Last 3 Encounters:  01/07/23 136 lb 3.2 oz (61.8 kg)  12/10/22 141 lb (64 kg)  12/03/22 142 lb (64.4 kg)  Physical Exam  Constitutional: Patient appears well-developed and well-nourished.  No distress.  HEENT: head atraumatic, normocephalic, pupils equal and reactive to light, neck supple Cardiovascular: Normal rate, regular rhythm and normal heart sounds.  No murmur heard. No BLE edema. Pulmonary/Chest: Effort normal and breath sounds normal. No respiratory distress. Abdominal: Soft.  There is no tenderness. Psychiatric: Patient has a normal mood and affect. behavior is normal. Judgment and thought content normal.      Assessment & Plan:   Problem List Items Addressed This Visit       Other   Mild episode of recurrent major depressive disorder (HCC) - Primary    Continue lexapro 10 mg daily, doing well      Relevant Medications   escitalopram (LEXAPRO) 10 MG tablet   Anxiety    Continue lexapro 10 mg daily, doing well      Relevant Medications   escitalopram (LEXAPRO) 10 MG tablet     Follow up plan: Return in about 6 months (around 07/08/2023) for follow up.

## 2023-01-18 ENCOUNTER — Encounter: Payer: Self-pay | Admitting: Obstetrics and Gynecology

## 2023-01-18 ENCOUNTER — Ambulatory Visit (INDEPENDENT_AMBULATORY_CARE_PROVIDER_SITE_OTHER): Payer: 59 | Admitting: Obstetrics and Gynecology

## 2023-01-18 VITALS — BP 103/71 | HR 97

## 2023-01-18 DIAGNOSIS — N3281 Overactive bladder: Secondary | ICD-10-CM | POA: Diagnosis not present

## 2023-01-18 DIAGNOSIS — R350 Frequency of micturition: Secondary | ICD-10-CM

## 2023-01-18 DIAGNOSIS — N3941 Urge incontinence: Secondary | ICD-10-CM

## 2023-01-18 LAB — POCT URINALYSIS DIPSTICK
Bilirubin, UA: NEGATIVE
Blood, UA: NEGATIVE
Glucose, UA: NEGATIVE
Ketones, UA: NEGATIVE
Leukocytes, UA: NEGATIVE
Nitrite, UA: NEGATIVE
Protein, UA: NEGATIVE
Spec Grav, UA: <=1.005 — AB (ref 1.010–1.025)
Urobilinogen, UA: 0.2 U/dL
pH, UA: 6 (ref 5.0–8.0)

## 2023-01-18 NOTE — Progress Notes (Signed)
[facility] Urodynamics Procedure  Referring Physician: Berniece Salines, FNP Date of Procedure: 01/18/2023  Jordan [name] is a 32 y.o. female who presents for urodynamic evaluation. Indication(s) for study: occult SUI and OAB  Vital Signs: BP 103/71   Pulse 97   Laboratory Results: A catheterized urine specimen revealed:  Lab Results  Component Value Date   COLORU yellow 01/18/2023   CLARITYU clear 01/18/2023   GLUCOSEUR Negative 01/18/2023   BILIRUBINUR negative 01/18/2023   KETONESU negative 01/18/2023   SPECGRAV <=1.005 (A) 01/18/2023   RBCUR negative 01/18/2023   PHUR 6.0 01/18/2023   PROTEINUR Negative 01/18/2023   UROBILINOGEN 0.2 01/18/2023   LEUKOCYTESUR Negative 01/18/2023      Voiding Diary: Not Done  Procedure Timeout:  The correct patient was verified and the correct procedure was verified. The patient was in the correct position and safety precautions were reviewed based on at the patient's history.  Urodynamic Procedure A 42F dual lumen urodynamics catheter was placed under sterile conditions into the patient's bladder. A 42F catheter was placed into the rectum in order to measure abdominal pressure. EMG patches were placed in the appropriate position.  All connections were confirmed and calibrations/adjusted made. Saline was instilled into the bladder through the dual lumen catheters.  Cough/valsalva pressures were measured periodically during filling.  Patient was allowed to void.  The bladder was then emptied of its residual.  UROFLOW: Revealed a Qmax of 18.2 mL/sec.  She voided 313 mL and had a residual of 50 mL.  It was a normal pattern and represented normal habits.   CMG: This was performed with sterile water in the sitting position at a fill rate of 30 mL/min.    First sensation of fullness was 114 mLs,  First urge was 167 mLs,  Strong urge was 228 mLs and  Capacity was 445 mLs  Stress incontinence was not demonstrated Highest  negative Barrier CLPP was 105 cmH20 at 371 ml, while standing.  Highest negative Barrier VLPP was 72 cmH20 at 312 ml.  Detrusor function was overactive, with phasic contractions seen.  The first occurred at 94 mL to 5 cm of water and was not associated with urge.  Compliance:  Low. End fill detrusor pressure was 20 cmH20.  Calculated compliance was 22.38mL/cmH20  UPP: MUCP with barrier reduction was 102 cm of water.    MICTURITION STUDY: Voiding was performed without reduction in the sitting position.  Pdet at Qmax was 1 cm of water.  Qmax was 15.4 mL/sec.  It was a normal pattern.  She voided 440 mL and had a residual of 5 mL.  It was a volitional void, sustained detrusor contraction was present and abdominal straining was not present  EMG: This was performed with patches.  She had voluntary contractions, recruitment with fill was present and urethral sphincter was relaxed with void.  The details of the procedure with the study tracings have been scanned into EPIC.   Urodynamic Impression:  1. Sensation was normal; capacity was normal 2. Stress Incontinence was not demonstrated at normal pressures; 3. Detrusor Overactivity was demonstrated without leakage. 4. Emptying was normal with a normal PVR, a sustained detrusor contraction present,  abdominal straining not present, normal urethral sphincter activity on EMG.  Plan: - The patient will follow up  to discuss the findings and treatment options.

## 2023-02-17 ENCOUNTER — Other Ambulatory Visit (HOSPITAL_COMMUNITY): Payer: Self-pay

## 2023-02-17 DIAGNOSIS — F4312 Post-traumatic stress disorder, chronic: Secondary | ICD-10-CM | POA: Diagnosis not present

## 2023-02-17 DIAGNOSIS — F4010 Social phobia, unspecified: Secondary | ICD-10-CM | POA: Diagnosis not present

## 2023-02-17 DIAGNOSIS — F331 Major depressive disorder, recurrent, moderate: Secondary | ICD-10-CM | POA: Diagnosis not present

## 2023-02-17 MED ORDER — ESCITALOPRAM OXALATE 5 MG PO TABS
5.0000 mg | ORAL_TABLET | Freq: Every day | ORAL | 0 refills | Status: DC
  Filled 2023-02-17: qty 30, 30d supply, fill #0

## 2023-02-23 ENCOUNTER — Telehealth: Payer: Self-pay | Admitting: Pharmacist

## 2023-02-23 NOTE — Telephone Encounter (Signed)
Pharmacy Patient Advocate Encounter  Received notification from [facility] that Prior Authorization for Nurtec 75MG  dispersible tablets has been APPROVED from 02/23/2023 to 02/22/2024   PA #/Case ID/Reference #: [id number]

## 2023-02-23 NOTE — Telephone Encounter (Signed)
Pharmacy Patient Advocate Encounter   Received notification from CoverMyMeds that prior authorization for Nurtec 75MG  dispersible tablets is required/requested.   Insurance verification completed.   The patient is insured through [facility] .   Per test claim: PA required; PA submitted to above mentioned insurance via CoverMyMeds Key/confirmation #/EOC Nurtec 75MG  dispersible tablets Status is pending

## 2023-03-16 ENCOUNTER — Encounter: Payer: Self-pay | Admitting: Obstetrics and Gynecology

## 2023-03-17 ENCOUNTER — Other Ambulatory Visit (HOSPITAL_COMMUNITY): Payer: Self-pay

## 2023-03-17 DIAGNOSIS — F331 Major depressive disorder, recurrent, moderate: Secondary | ICD-10-CM | POA: Diagnosis not present

## 2023-03-17 DIAGNOSIS — F4312 Post-traumatic stress disorder, chronic: Secondary | ICD-10-CM | POA: Diagnosis not present

## 2023-03-17 DIAGNOSIS — F4010 Social phobia, unspecified: Secondary | ICD-10-CM | POA: Diagnosis not present

## 2023-03-17 MED ORDER — ESCITALOPRAM OXALATE 10 MG PO TABS
ORAL_TABLET | ORAL | 0 refills | Status: DC
  Filled 2023-03-17: qty 35, 28d supply, fill #0

## 2023-04-07 ENCOUNTER — Encounter (HOSPITAL_BASED_OUTPATIENT_CLINIC_OR_DEPARTMENT_OTHER): Payer: Self-pay | Admitting: Obstetrics and Gynecology

## 2023-04-08 ENCOUNTER — Encounter (HOSPITAL_BASED_OUTPATIENT_CLINIC_OR_DEPARTMENT_OTHER): Payer: Self-pay | Admitting: Obstetrics and Gynecology

## 2023-04-08 NOTE — Progress Notes (Signed)
 Spoke w/ via phone for pre-op interview--- pt Lab needs dos---- urine preg        Lab results------ lab appt 04-13-2023 @ 0945 getting CBC/ T&S COVID test -----patient states asymptomatic no test needed Arrive at ------- 0930 on 04-19-2023 NPO after MN NO Solid Food.  Clear liquids from MN until--- 0830 Med rec completed Medications to take morning of surgery ----- lexapro , nurtec if needed Diabetic medication ----- n/a Patient instructed no nail polish to be worn day of surgery Patient instructed to bring photo id and insurance card day of surgery Patient aware to have Driver (ride ) / caregiver    for 24 hours after surgery - husband, eric Patient Special Instructions ----- will pick up bag w/ hibiclens and written instructions at lab appt.  Asked to call with any questions Pre-Op special Instructions ----- requested pre-op orders via inbox message in epic to dr schroeder Patient verbalized understanding of instructions that were given at this phone interview. Patient denies chest pain, sob, fever, cough at the interview.

## 2023-04-08 NOTE — Progress Notes (Signed)
 Your procedure is scheduled on :  Tuesday,  04-19-2023  Report to [facility] AT  _9:30__ AM.   Call this number if you have problems the morning of surgery  [phone]. Any questions prior to surgery call pre-op nurse,  Arya Boxley :  [phone]   OUR ADDRESS IS [address]  WE ARE LOCATED IN [facility]  PLEASE BRING YOUR INSURANCE CARD AND PHOTO ID DAY OF SURGERY.                                     REMEMBER:  Do not eat food after midnight night before surgery.  You may have clear liquid diet from midnight night before surgery until 8:30 AM.  NO clear liquids after 8:30 AM day of surgery.  This includes no water ,  candy,  gum,  and mintsl   Please brush your teeth morning of surgery and rinse mouth out.   CLEAR LIQUID DIET  Allowed      Water                                                                    Coffee and tea, regular and decaf  (NO cream or milk products of any type, may sweeten, no honey)                         Carbonated beverages, regular and diet                                    Sports drinks like Gatorade _____________________________________________________________________     TAKE ONLY THESE MEDICATIONS MORNING OF SURGERY: If taking after 8:30 AM , may take with sips of water  Escitalopram  (lexopram) If needed may take Nurtec    _________________________________________________________________________                                   DO NOT WEAR JEWERLY/  METAL/  PIERCINGS (INCLUDING NO PLASTIC PIERCINGS) DO NOT WEAR LOTIONS, POWDERS, PERFUMES OR NAIL POLISH ON YOUR FINGERNAILS. TOENAIL POLISH IS OK TO WEAR. DO NOT SHAVE FOR 48 HOURS PRIOR TO DAY OF SURGERY.  CONTACTS, GLASSES, OR DENTURES MAY NOT BE WORN TO SURGERY.  REMEMBER: NO SMOKING, VAPING ,  DRUGS OR ALCOHOL FOR 24 HOURS BEFORE YOUR SURGERY.                                    [facility] IS NOT RESPONSIBLE  FOR ANY BELONGINGS.                                                                     [facility]  [facility] - Preparing for Surgery Before surgery, you can play an important role.  Because skin is not sterile, your skin needs to be as free of germs as possible.  You can reduce the number of germs on your skin by washing with CHG (chlorahexidine gluconate) soap before surgery.  CHG is an antiseptic cleaner which kills germs and bonds with the skin to continue killing germs even after washing. Please DO NOT use if you have an allergy to CHG or antibacterial soaps.  If your skin becomes reddened/irritated stop using the CHG and inform your nurse when you arrive at Short Stay. Do not shave (including legs and underarms) for at least 48 hours prior to the first CHG shower.  You may shave your face/neck. Please follow these instructions carefully:  1.  Shower with CHG Soap the night before surgery and the  morning of Surgery.  2.  If you choose to wash your hair, wash your hair first as usual with your  normal  shampoo.  3.  After you shampoo, rinse your hair and body thoroughly to remove the  shampoo.                                        4.  Use CHG as you would any other liquid soap.  You can apply chg directly  to the skin and wash , chg soap provided, night before and morning of your surgery.  5.  Apply the CHG Soap to your body ONLY FROM THE NECK DOWN.   Do not use on face/ open                           Wound or open sores. Avoid contact with eyes, ears mouth and genitals (private parts).                       Wash face,  Genitals (private parts) with your normal soap.             6.  Wash thoroughly, paying special attention to the area where your surgery  will be performed.  7.  Thoroughly rinse your body with warm water  from the neck down.  8.  DO NOT shower/wash with your normal soap after using and rinsing off  the CHG Soap.             9.  Pat yourself dry with a clean towel.            10.  Wear clean pajamas.             11.  Place clean sheets on your bed the night of your first shower and do not  sleep with pets. Day of Surgery : Do not apply any lotions/ powders the morning of surgery.  Please wear clean clothes to the hospital/surgery center.  IF YOU HAVE ANY SKIN IRRITATION OR PROBLEMS WITH THE SURGICAL SOAP, PLEASE GET A BAR OF GOLD DIAL SOAP AND SHOWER THE NIGHT BEFORE YOUR SURGERY AND THE MORNING OF YOUR SURGERY. PLEASE LET THE NURSE KNOW MORNING OF YOUR SURGERY IF YOU HAD ANY PROBLEMS WITH THE SURGICAL SOAP.   YOUR SURGEON MAY HAVE REQUESTED EXTENDED RECOVERY TIME AFTER YOUR SURGERY. IT COULD BE A  JUST A FEW HOURS  UP TO AN OVERNIGHT STAY.  YOUR SURGEON SHOULD HAVE DISCUSSED  THIS WITH YOU PRIOR TO YOUR SURGERY. IN THE EVENT YOU NEED TO STAY OVERNIGHT PLEASE REFER TO THE FOLLOWING GUIDELINES. YOU MAY HAVE UP TO 4 VISITORS  MAY VISIT IN THE EXTENDED RECOVERY ROOM UNTIL 800 PM ONLY.  ONE  VISITOR AGE 3 AND OVER MAY SPEND THE NIGHT AND MUST BE IN EXTENDED RECOVERY ROOM NO LATER THAN 800 PM . YOUR DISCHARGE TIME AFTER YOU SPEND THE NIGHT IS 900 AM THE MORNING AFTER YOUR SURGERY. YOU MAY PACK A SMALL OVERNIGHT BAG WITH TOILETRIES FOR YOUR OVERNIGHT STAY IF YOU WISH.  REGARDLESS OF IF YOU STAY OVER NIGHT OR ARE DISCHARGED THE SAME DAY YOU WILL BE REQUIRED TO HAVE A RESPONSIBLE ADULT (18 YRS OLD OR OLDER) STAY WITH YOU FOR AT LEAST THE FIRST 24 HOURS WHEN HOME  YOUR PRESCRIPTION MEDICATIONS WILL BE PROVIDED DURING [facility] STAY.  ________________________________________________________________________

## 2023-04-11 ENCOUNTER — Other Ambulatory Visit (HOSPITAL_COMMUNITY): Payer: Self-pay

## 2023-04-11 DIAGNOSIS — F4010 Social phobia, unspecified: Secondary | ICD-10-CM | POA: Diagnosis not present

## 2023-04-11 DIAGNOSIS — F331 Major depressive disorder, recurrent, moderate: Secondary | ICD-10-CM | POA: Diagnosis not present

## 2023-04-11 DIAGNOSIS — F4312 Post-traumatic stress disorder, chronic: Secondary | ICD-10-CM | POA: Diagnosis not present

## 2023-04-11 MED ORDER — ESCITALOPRAM OXALATE 10 MG PO TABS
15.0000 mg | ORAL_TABLET | Freq: Every day | ORAL | 0 refills | Status: DC
  Filled 2023-04-11: qty 45, 30d supply, fill #0

## 2023-04-12 ENCOUNTER — Encounter: Payer: 59 | Admitting: Obstetrics and Gynecology

## 2023-04-13 ENCOUNTER — Encounter (HOSPITAL_COMMUNITY)
Admission: RE | Admit: 2023-04-13 | Discharge: 2023-04-13 | Disposition: A | Discharge status: Home / Self Care | Payer: 59 | Source: Ambulatory Visit | Attending: Obstetrics and Gynecology | Admitting: Obstetrics and Gynecology

## 2023-04-13 DIAGNOSIS — Z01818 Encounter for other preprocedural examination: Secondary | ICD-10-CM

## 2023-04-13 DIAGNOSIS — Z01812 Encounter for preprocedural laboratory examination: Secondary | ICD-10-CM | POA: Insufficient documentation

## 2023-04-13 LAB — CBC
HCT: 38.4 % (ref 36.0–46.0)
Hemoglobin: 13 g/dL (ref 12.0–15.0)
MCH: 31.9 pg (ref 26.0–34.0)
MCHC: 33.9 g/dL (ref 30.0–36.0)
MCV: 94.3 fL (ref 80.0–100.0)
Platelets: 265 10*3/uL (ref 150–400)
RBC: 4.07 MIL/uL (ref 3.87–5.11)
RDW: 12.9 % (ref 11.5–15.5)
WBC: 5.8 10*3/uL (ref 4.0–10.5)
nRBC: 0 % (ref 0.0–0.2)

## 2023-04-14 ENCOUNTER — Other Ambulatory Visit (HOSPITAL_COMMUNITY): Payer: Self-pay

## 2023-04-15 ENCOUNTER — Telehealth: Payer: 59 | Admitting: Obstetrics and Gynecology

## 2023-04-15 ENCOUNTER — Encounter: Payer: No Typology Code available for payment source | Admitting: Obstetrics and Gynecology

## 2023-04-15 ENCOUNTER — Other Ambulatory Visit (HOSPITAL_COMMUNITY): Payer: Self-pay

## 2023-04-15 DIAGNOSIS — Z01818 Encounter for other preprocedural examination: Secondary | ICD-10-CM

## 2023-04-15 MED ORDER — ACETAMINOPHEN 500 MG PO TABS
500.0000 mg | ORAL_TABLET | Freq: Four times a day (QID) | ORAL | 0 refills | Status: AC | PRN
  Filled 2023-04-15: qty 30, 8d supply, fill #0

## 2023-04-15 MED ORDER — IBUPROFEN 600 MG PO TABS
600.0000 mg | ORAL_TABLET | Freq: Four times a day (QID) | ORAL | 0 refills | Status: AC | PRN
  Filled 2023-04-15: qty 30, 8d supply, fill #0

## 2023-04-15 MED ORDER — OXYCODONE HCL 5 MG PO TABS
5.0000 mg | ORAL_TABLET | ORAL | 0 refills | Status: DC | PRN
  Filled 2023-04-15: qty 15, 3d supply, fill #0

## 2023-04-15 MED ORDER — POLYETHYLENE GLYCOL 3350 17 GM/SCOOP PO POWD
17.0000 g | Freq: Every day | ORAL | 0 refills | Status: DC
  Filled 2023-04-15: qty 510, 30d supply, fill #0

## 2023-04-15 NOTE — Patient Instructions (Addendum)
 POST OPERATIVE INSTRUCTIONS  General Instructions Recovery (not bed rest) will last approximately 6 weeks Walking is encouraged, but refrain from strenuous exercise/ housework/ heavy lifting. No lifting >10lbs  Nothing in the vagina- NO intercourse, tampons or douching Bathing:  Do not submerge in water  (NO swimming, bath, hot tub, etc) until after your postop visit. You can shower starting the day after surgery.  No driving until you are not taking narcotic pain medicine and until your pain is well enough controlled that you can slam on the breaks or make sudden movements if needed.   Taking your medications Please take your acetaminophen  and ibuprofen  on a schedule for the first 48 hours. Take 600mg  ibuprofen , then take 500mg  acetaminophen  3 hours later, then continue to alternate ibuprofen  and acetaminophen . That way you are taking each type of medication every 6 hours. Take the prescribed narcotic (oxycodone , tramadol, etc) as needed, with a maximum being every 4 hours.  Take a stool softener daily to keep your stools soft and preventing you from straining. If you have diarrhea, you decrease your stool softener. This is explained more below. We have prescribed you Miralax .  Reasons to Call the Nurse (see last page for phone numbers) Heavy Bleeding (changing your pad every 1-2 hours) Persistent nausea/vomiting Fever (100.4 degrees or more) Incision problems (pus or other fluid coming out, redness, warmth, increased pain)  Things to Expect After Surgery Mild to Moderate pain is normal during the first day or two after surgery. If prescribed, take Ibuprofen  or Tylenol  first and use the stronger medicine for "break-through" pain. You can overlap these medicines because they work differently.   Constipation   To Prevent Constipation:  Eat a well-balanced diet including protein, grains, fresh fruit and vegetables.  Drink plenty of fluids. Walk regularly.  Depending on specific instructions  from your physician: take Miralax  daily and additionally you can add a stool softener (colace/ docusate) and fiber supplement. Continue as long as you're on pain medications.   To Treat Constipation:  If you do not have a bowel movement in 2 days after surgery, you can take 2 Tbs of Milk of Magnesia 1-2 times a day until you have a bowel movement. If diarrhea occurs, decrease the amount or stop the laxative. If no results with Milk of Magnesia, you can drink a bottle of magnesium  citrate which you can purchase over the counter.  Fatigue:  This is a normal response to surgery and will improve with time.  Plan frequent rest periods throughout the day.  Gas Pain:  This is very common but can also be very painful! Drink warm liquids such as herbal teas, bouillon or soup. Walking will help you pass more gas.  Mylicon or Gas-X can be taken over the counter.  Leaking Urine:  Varying amounts of leakage may occur after surgery.  This should improve with time. Your bladder needs at least 3 months to recover from surgery. If you leak after surgery, be sure to mention this to your doctor at your post-op visit. If you were taking medications for overactive bladder prior to surgery, be sure to restart the medications immediately after surgery.  Incisions: If you have incisions on your abdomen, the skin glue will dissolve on its own over time. It is ok to gently rinse with soap and water  over these incisions but do not scrub.  Catheter Approximately 1/3 of patients are unable to urinate after surgery and need to go home with a catheter. This allows your bladder to  rest so it can return to full function. If you go home with a catheter, the office will call to set up a voiding trial a few days after surgery. For most patients, by this visit, they are able to urinate on their own. Long term catheter use is rare.   Return to Work  As work demands and recovery times vary widely, it is hard to predict when you will want  to return to work. If you have a desk job with no strenuous physical activity, and if you would like to return sooner than generally recommended, discuss this with your provider or call our office.   Post op concerns  For non-emergent issues, please call the Urogynecology Nurse. Please leave a message and someone will contact you within one business day.  You can also send a message through MyChart.   AFTER HOURS (After 5:00 PM and on weekends):  For urgent matters that cannot wait until the next business day. Call our office [phone] and connect to the doctor on call.  Please reserve this for important issues.   **FOR ANY TRUE EMERGENCY ISSUES CALL 911 OR GO TO THE NEAREST EMERGENCY ROOM.** Please inform our office or the doctor on call of any emergency.     APPOINTMENTS: Call [phone]

## 2023-04-15 NOTE — Progress Notes (Signed)
 [facility] Return visit- virtual  Patient location- home Provider location- home Patient was on the video with her son and consented to video visit  Subjective Chief Complaint: WILBUR LABUDA presents for a preoperative encounter.   History of Present Illness: Jordan [name] is a 33 y.o. female who presents for preoperative visit.  She is scheduled to undergo Exam under anesthesia, robotic assisted total laparoscopic hysterectomy with bilateral salpingectomy, sacrocolpopexy, cystoscopy, possible perineorrhaphy on 04/18/22.  Her symptoms include vaginal bulge, and she was was found to have Stage II anterior, Stage I posterior, Stage I apical prolapse.   Urodynamic Impression:  1. Sensation was normal; capacity was normal 2. Stress Incontinence was not demonstrated at normal pressures; 3. Detrusor Overactivity was demonstrated without leakage. 4. Emptying was normal with a normal PVR, a sustained detrusor contraction present,  abdominal straining not present, normal urethral sphincter activity on EMG.    Past Medical History:  Diagnosis Date   Anemia    Anxiety    MDD (major depressive disorder)    Migraine with aura and without status migrainosus, not intractable    followed by [facility]--- duwaine pelton NP   Prolapse of female pelvic organs    anterior vaginal wall/ incomplete uterovaginal wall   SUI (stress urinary incontinence, female)    Wears glasses      Past Surgical History:  Procedure Laterality Date   NO PAST SURGERIES     WISDOM TOOTH EXTRACTION  08/04/2010    is allergic to latex.   Family History  Problem Relation Age of Onset   Hypertension Father    Hyperlipidemia Father    Cancer Maternal Grandmother    Cancer Maternal Grandfather    Cancer Paternal Grandmother     Social History   Tobacco Use   Smoking status: Never   Smokeless tobacco: Never  Vaping Use   Vaping status: Never Used  Substance Use Topics   Alcohol use:  Not Currently   Drug use: Never     Review of Systems was negative for a full 10 system review except as noted in the History of Present Illness.   Current Outpatient Medications:    escitalopram  (LEXAPRO ) 10 MG tablet, Take 1&1/2 tablets (15 mg total) by mouth daily., Disp: 45 tablet, Rfl: 0   Rimegepant Sulfate  (NURTEC) 75 MG TBDP, Take 1 tab at the onset of a migraine. Only 1 tab in 24 hours. (Patient taking differently: Take by mouth as needed. Take 1 tab at the onset of a migraine. Only 1 tab in 24 hours.), Disp: 15 tablet, Rfl: 11   Objective There were no vitals filed for this visit.  Gen: NAD  Previous Pelvic Exam showed:    0                                            Aa   0                                           Ba   -3  C    4                                            Gh   3.5                                            Pb   8                                            tvl    -2                                            Ap   -2                                            Bp   -7                                              D         Assessment/ Plan  Assessment: The patient is a 33 y.o. year old scheduled to undergo Exam under anesthesia, robotic assisted total laparoscopic hysterectomy with bilateral salpingectomy, sacrocolpopexy, cystoscopy, possible perineorrhaphy.   Plan: General Surgical Consent: The patient has previously been counseled on alternative treatments, and the decision by the patient and provider was to proceed with the procedure listed above.  For all procedures, there are risks of bleeding, infection, damage to surrounding organs including but not limited to bowel, bladder, blood vessels, ureters and nerves, and need for further surgery if an injury were to occur. These risks are all low with minimally invasive surgery.   There are risks of numbness and weakness at any body site or  buttock/rectal pain.  It is possible that baseline pain can be worsened by surgery, either with or without mesh. If surgery is vaginal, there is also a low risk of possible conversion to laparoscopy or open abdominal incision where indicated. Very rare risks include blood transfusion, blood clot, heart attack, pneumonia, or death.   There is also a risk of short-term postoperative urinary retention with need to use a catheter. About half of patients need to go home from surgery with a catheter, which is then later removed in the office. The risk of long-term need for a catheter is very low. There is also a risk of worsening of overactive bladder.    Prolapse (with or without mesh): Risk factors for surgical failure  include things that put pressure on your pelvis and the surgical repair, including obesity, chronic cough, and heavy lifting or straining (including lifting children or adults, straining on the toilet, or lifting heavy objects such as furniture or anything weighing >25 lbs. Risks of recurrence is 20-30% with vaginal native tissue repair and a less than  10% with sacrocolpopexy with mesh.    Sacrocolpopexy: Mesh implants may provide more prolapse support, but do have some unique risks to consider. It is important to understand that mesh is permanent and cannot be easily removed. Risks of abdominal sacrocolpopexy mesh include mesh exposure (~3-6%), painful intercourse (recent studies show lower rates after surgery compared to before, with ~5-8% risk of new onset), and very rare risks of bowel or bladder injury or infection (<1%). The risk of mesh exposure is more likely in a woman with risks for poor healing (prior radiation, poorly controlled diabetes, or immunocompromised). The risk of new or worsened chronic pain after mesh implant is more common in women with baseline chronic pain and/or poorly controlled anxiety or depression. There is an FDA safety notification on vaginal mesh procedures for  prolapse but NOT abdominal mesh procedures and therefore does not apply to your surgery. We have extensive experience and training with mesh placement and we have close postoperative follow up to identify any potential complications from mesh.    We discussed consent for blood products. Risks for blood transfusion include allergic reactions, other reactions that can affect different body organs and managed accordingly, transmission of infectious diseases such as HIV or Hepatitis. However, the blood is screened. Patient consents for blood products.  Pre-operative instructions:  She was instructed to not take Aspirin/NSAIDs x 7days prior to surgery. Antibiotic prophylaxis was ordered as indicated.  Catheter use: Patient will go home with foley if needed after post-operative voiding trial.  Post-operative instructions:  She was provided with specific post-operative instructions, including precautions and signs/symptoms for which we would recommend contacting us , in addition to daytime and after-hours contact phone numbers. This was provided on a handout.   Post-operative medications: Prescriptions for motrin , tylenol , miralax , and oxycodone  were sent to her pharmacy. Discussed using ibuprofen  and tylenol  on a schedule to limit use of narcotics.   Laboratory testing:  We will check labs: CBC and type and screen.  Day of surgery UPT  Preoperative clearance:  She does not require surgical clearance.    Post-operative follow-up:  A post-operative appointment will be made for 6 weeks from the date of surgery. If she needs a post-operative nurse visit for a voiding trial, that will be set up after she leaves the hospital.    Patient will call the clinic or use MyChart should anything change or any new issues arise.   [name], MD

## 2023-04-15 NOTE — H&P (Signed)
 [facility] Pre op H&P  Subjective Chief Complaint: Jordan [name] presents for a preoperative encounter.   History of Present Illness: Jordan [name] is a 33 y.o. female who presents for preoperative visit.  She is scheduled to undergo Exam under anesthesia, robotic assisted total laparoscopic hysterectomy with bilateral salpingectomy, sacrocolpopexy, cystoscopy, possible perineorrhaphy on 04/18/22.  Her symptoms include vaginal bulge, and she was was found to have Stage II anterior, Stage I posterior, Stage I apical prolapse.   Urodynamic Impression:  1. Sensation was normal; capacity was normal 2. Stress Incontinence was not demonstrated at normal pressures; 3. Detrusor Overactivity was demonstrated without leakage. 4. Emptying was normal with a normal PVR, a sustained detrusor contraction present,  abdominal straining not present, normal urethral sphincter activity on EMG.    Past Medical History:  Diagnosis Date   Anemia    Anxiety    MDD (major depressive disorder)    Migraine with aura and without status migrainosus, not intractable    followed by [facility]--- duwaine pelton NP   Prolapse of female pelvic organs    anterior vaginal wall/ incomplete uterovaginal wall   SUI (stress urinary incontinence, female)    Wears glasses      Past Surgical History:  Procedure Laterality Date   NO PAST SURGERIES     WISDOM TOOTH EXTRACTION  08/04/2010    is allergic to latex.   Family History  Problem Relation Age of Onset   Hypertension Father    Hyperlipidemia Father    Cancer Maternal Grandmother    Cancer Maternal Grandfather    Cancer Paternal Grandmother     Social History   Tobacco Use   Smoking status: Never   Smokeless tobacco: Never  Vaping Use   Vaping status: Never Used  Substance Use Topics   Alcohol use: Not Currently   Drug use: Never     Review of Systems was negative for a full 10 system review except as noted in the History  of Present Illness.  No current facility-administered medications for this encounter.  Current Outpatient Medications:    Rimegepant Sulfate  (NURTEC) 75 MG TBDP, Take 1 tab at the onset of a migraine. Only 1 tab in 24 hours. (Patient taking differently: Take by mouth as needed. Take 1 tab at the onset of a migraine. Only 1 tab in 24 hours.), Disp: 15 tablet, Rfl: 11   acetaminophen  (TYLENOL ) 500 MG tablet, Take 1 tablet (500 mg total) by mouth every 6 (six) hours as needed (pain)., Disp: 30 tablet, Rfl: 0   escitalopram  (LEXAPRO ) 10 MG tablet, Take 1&1/2 tablets (15 mg total) by mouth daily., Disp: 45 tablet, Rfl: 0   ibuprofen  (ADVIL ) 600 MG tablet, Take 1 tablet (600 mg total) by mouth every 6 (six) hours as needed., Disp: 30 tablet, Rfl: 0   oxyCODONE  (OXY IR/ROXICODONE ) 5 MG immediate release tablet, Take 1 tablet (5 mg total) by mouth every 4 (four) hours as needed for severe pain (pain score 7-10)., Disp: 15 tablet, Rfl: 0   polyethylene glycol powder (GLYCOLAX /MIRALAX ) 17 GM/SCOOP powder, Take 17 g (1 scoop) dissolved in water  by mouth once per day., Disp: 510 g, Rfl: 0   Objective There were no vitals filed for this visit.  Gen: NAD  Previous Pelvic Exam showed:    0  Aa   0                                           Ba   -3                                              C    4                                            Gh   3.5                                            Pb   8                                            tvl    -2                                            Ap   -2                                            Bp   -7                                              D         Assessment/ Plan  The patient is a 33 y.o. year old scheduled to undergo Exam under anesthesia, robotic assisted total laparoscopic hysterectomy with bilateral salpingectomy, sacrocolpopexy, cystoscopy, possible perineorrhaphy.    [name], MD

## 2023-04-19 ENCOUNTER — Other Ambulatory Visit: Payer: Self-pay

## 2023-04-19 ENCOUNTER — Ambulatory Visit (HOSPITAL_BASED_OUTPATIENT_CLINIC_OR_DEPARTMENT_OTHER): Payer: 59 | Admitting: Anesthesiology

## 2023-04-19 ENCOUNTER — Ambulatory Visit (HOSPITAL_BASED_OUTPATIENT_CLINIC_OR_DEPARTMENT_OTHER)
Admission: RE | Admit: 2023-04-19 | Discharge: 2023-04-19 | Disposition: A | Discharge status: Home / Self Care | Payer: 59 | Source: Ambulatory Visit | Attending: Obstetrics and Gynecology | Admitting: Obstetrics and Gynecology

## 2023-04-19 ENCOUNTER — Encounter (HOSPITAL_BASED_OUTPATIENT_CLINIC_OR_DEPARTMENT_OTHER): Payer: Self-pay | Admitting: Obstetrics and Gynecology

## 2023-04-19 ENCOUNTER — Encounter (HOSPITAL_BASED_OUTPATIENT_CLINIC_OR_DEPARTMENT_OTHER)
Admission: RE | Disposition: A | Discharge status: Home / Self Care | Payer: Self-pay | Source: Ambulatory Visit | Attending: Obstetrics and Gynecology

## 2023-04-19 DIAGNOSIS — N812 Incomplete uterovaginal prolapse: Secondary | ICD-10-CM | POA: Diagnosis not present

## 2023-04-19 DIAGNOSIS — Z01818 Encounter for other preprocedural examination: Secondary | ICD-10-CM

## 2023-04-19 DIAGNOSIS — F329 Major depressive disorder, single episode, unspecified: Secondary | ICD-10-CM | POA: Diagnosis not present

## 2023-04-19 DIAGNOSIS — N8311 Corpus luteum cyst of right ovary: Secondary | ICD-10-CM | POA: Diagnosis not present

## 2023-04-19 DIAGNOSIS — N880 Leukoplakia of cervix uteri: Secondary | ICD-10-CM | POA: Diagnosis not present

## 2023-04-19 DIAGNOSIS — N83292 Other ovarian cyst, left side: Secondary | ICD-10-CM

## 2023-04-19 DIAGNOSIS — N393 Stress incontinence (female) (male): Secondary | ICD-10-CM | POA: Diagnosis not present

## 2023-04-19 DIAGNOSIS — N8110 Cystocele, unspecified: Secondary | ICD-10-CM

## 2023-04-19 DIAGNOSIS — F419 Anxiety disorder, unspecified: Secondary | ICD-10-CM | POA: Insufficient documentation

## 2023-04-19 DIAGNOSIS — N83202 Unspecified ovarian cyst, left side: Secondary | ICD-10-CM | POA: Insufficient documentation

## 2023-04-19 DIAGNOSIS — N72 Inflammatory disease of cervix uteri: Secondary | ICD-10-CM | POA: Diagnosis not present

## 2023-04-19 HISTORY — DX: Anemia, unspecified: D64.9

## 2023-04-19 HISTORY — PX: PERINEOPLASTY: SHX2218

## 2023-04-19 HISTORY — DX: Female genital prolapse, unspecified: N81.9

## 2023-04-19 HISTORY — DX: Anxiety disorder, unspecified: F41.9

## 2023-04-19 HISTORY — DX: Major depressive disorder, single episode, unspecified: F32.9

## 2023-04-19 HISTORY — DX: Presence of spectacles and contact lenses: Z97.3

## 2023-04-19 HISTORY — DX: Migraine with aura, not intractable, without status migrainosus: G43.109

## 2023-04-19 HISTORY — PX: XI ROBOTIC ASSISTED TOTAL HYSTERECTOMY WITH SACROCOLPOPEXY: SHX6825

## 2023-04-19 HISTORY — DX: Stress incontinence (female) (male): N39.3

## 2023-04-19 HISTORY — PX: CYSTOSCOPY: SHX5120

## 2023-04-19 HISTORY — PX: ROBOTIC ASSISTED LAPAROSCOPIC OVARIAN CYSTECTOMY: SHX6081

## 2023-04-19 LAB — TYPE AND SCREEN
ABO/RH(D): O POS
Antibody Screen: NEGATIVE

## 2023-04-19 LAB — POCT PREGNANCY, URINE: Preg Test, Ur: NEGATIVE

## 2023-04-19 SURGERY — HYSTERECTOMY, TOTAL, ROBOT-ASSISTED, WITH SACROCOLPOPEXY
Anesthesia: General | Site: Vagina

## 2023-04-19 MED ORDER — GLYCOPYRROLATE PF 0.2 MG/ML IJ SOSY
PREFILLED_SYRINGE | INTRAMUSCULAR | Status: AC
  Filled 2023-04-19: qty 3

## 2023-04-19 MED ORDER — SCOPOLAMINE 1 MG/3DAYS TD PT72
1.0000 | MEDICATED_PATCH | TRANSDERMAL | Status: DC
  Administered 2023-04-19: 1.5 mg via TRANSDERMAL

## 2023-04-19 MED ORDER — KETOROLAC TROMETHAMINE 30 MG/ML IJ SOLN
30.0000 mg | Freq: Once | INTRAMUSCULAR | Status: AC
  Administered 2023-04-19: 30 mg via INTRAVENOUS

## 2023-04-19 MED ORDER — LACTATED RINGERS IV SOLN: INTRAVENOUS | Status: DC

## 2023-04-19 MED ORDER — MEPERIDINE HCL 25 MG/ML IJ SOLN: 6.2500 mg | INTRAMUSCULAR | Status: DC | PRN

## 2023-04-19 MED ORDER — DEXMEDETOMIDINE HCL IN NACL 80 MCG/20ML IV SOLN
INTRAVENOUS | Status: DC | PRN
  Administered 2023-04-19 (×2): 4 ug via INTRAVENOUS

## 2023-04-19 MED ORDER — SUGAMMADEX SODIUM 200 MG/2ML IV SOLN
INTRAVENOUS | Status: DC | PRN
  Administered 2023-04-19: 150 mg via INTRAVENOUS

## 2023-04-19 MED ORDER — METRONIDAZOLE 500 MG/100ML IV SOLN
INTRAVENOUS | Status: DC | PRN
  Administered 2023-04-19: 500 mg via INTRAVENOUS

## 2023-04-19 MED ORDER — ACETAMINOPHEN 325 MG PO TABS
650.0000 mg | ORAL_TABLET | ORAL | Status: DC | PRN
  Administered 2023-04-19: 650 mg via ORAL

## 2023-04-19 MED ORDER — OXYCODONE HCL 5 MG/5ML PO SOLN: 5.0000 mg | Freq: Once | ORAL | Status: DC | PRN

## 2023-04-19 MED ORDER — OXYCODONE HCL 5 MG PO TABS: 5.0000 mg | ORAL_TABLET | ORAL | Status: DC | PRN

## 2023-04-19 MED ORDER — DEXAMETHASONE SODIUM PHOSPHATE 4 MG/ML IJ SOLN
INTRAMUSCULAR | Status: DC | PRN
  Administered 2023-04-19: 10 mg via INTRAVENOUS

## 2023-04-19 MED ORDER — DROPERIDOL 2.5 MG/ML IJ SOLN: 0.6250 mg | Freq: Once | INTRAMUSCULAR | Status: DC | PRN

## 2023-04-19 MED ORDER — SIMETHICONE 80 MG PO CHEW: 80.0000 mg | CHEWABLE_TABLET | Freq: Four times a day (QID) | ORAL | Status: DC | PRN

## 2023-04-19 MED ORDER — KETOROLAC TROMETHAMINE 30 MG/ML IJ SOLN
INTRAMUSCULAR | Status: AC
Start: 2023-04-19 — End: ?
  Filled 2023-04-19: qty 1

## 2023-04-19 MED ORDER — HYDROMORPHONE HCL 1 MG/ML IJ SOLN: 0.2500 mg | INTRAMUSCULAR | Status: DC | PRN

## 2023-04-19 MED ORDER — MIDAZOLAM HCL 2 MG/2ML IJ SOLN
INTRAMUSCULAR | Status: AC
  Filled 2023-04-19: qty 2

## 2023-04-19 MED ORDER — SCOPOLAMINE 1 MG/3DAYS TD PT72
MEDICATED_PATCH | TRANSDERMAL | Status: AC
  Filled 2023-04-19: qty 1

## 2023-04-19 MED ORDER — CEFAZOLIN SODIUM-DEXTROSE 2-4 GM/100ML-% IV SOLN
INTRAVENOUS | Status: AC
  Filled 2023-04-19: qty 100

## 2023-04-19 MED ORDER — PHENAZOPYRIDINE HCL 100 MG PO TABS
200.0000 mg | ORAL_TABLET | ORAL | Status: AC
  Administered 2023-04-19: 200 mg via ORAL

## 2023-04-19 MED ORDER — GABAPENTIN 300 MG PO CAPS
ORAL_CAPSULE | ORAL | Status: AC
  Filled 2023-04-19: qty 1

## 2023-04-19 MED ORDER — POVIDONE-IODINE 10 % EX SWAB: 2.0000 | Freq: Once | CUTANEOUS | Status: DC

## 2023-04-19 MED ORDER — BUPIVACAINE HCL (PF) 0.25 % IJ SOLN
INTRAMUSCULAR | Status: DC | PRN
  Administered 2023-04-19: 22 mL

## 2023-04-19 MED ORDER — MIDAZOLAM HCL 2 MG/2ML IJ SOLN
INTRAMUSCULAR | Status: DC | PRN
  Administered 2023-04-19: 2 mg via INTRAVENOUS

## 2023-04-19 MED ORDER — FENTANYL CITRATE (PF) 100 MCG/2ML IJ SOLN
INTRAMUSCULAR | Status: AC
  Filled 2023-04-19: qty 2

## 2023-04-19 MED ORDER — ACETAMINOPHEN 500 MG PO TABS
1000.0000 mg | ORAL_TABLET | ORAL | Status: AC
  Administered 2023-04-19: 1000 mg via ORAL

## 2023-04-19 MED ORDER — CEFAZOLIN SODIUM-DEXTROSE 2-4 GM/100ML-% IV SOLN
2.0000 g | INTRAVENOUS | Status: AC
  Administered 2023-04-19: 2 g via INTRAVENOUS

## 2023-04-19 MED ORDER — STERILE WATER FOR IRRIGATION IR SOLN
Status: DC | PRN
  Administered 2023-04-19: 500 mL

## 2023-04-19 MED ORDER — SODIUM CHLORIDE 0.9 % IR SOLN
Status: DC | PRN
  Administered 2023-04-19: 500 mL

## 2023-04-19 MED ORDER — ONDANSETRON HCL 4 MG PO TABS: 4.0000 mg | ORAL_TABLET | Freq: Four times a day (QID) | ORAL | Status: DC | PRN

## 2023-04-19 MED ORDER — ONDANSETRON HCL 4 MG/2ML IJ SOLN
INTRAMUSCULAR | Status: DC | PRN
  Administered 2023-04-19: 4 mg via INTRAVENOUS

## 2023-04-19 MED ORDER — ACETAMINOPHEN 500 MG PO TABS
ORAL_TABLET | ORAL | Status: AC
  Filled 2023-04-19: qty 2

## 2023-04-19 MED ORDER — IBUPROFEN 200 MG PO TABS: 600.0000 mg | ORAL_TABLET | Freq: Four times a day (QID) | ORAL | Status: DC

## 2023-04-19 MED ORDER — PHENYLEPHRINE HCL (PRESSORS) 10 MG/ML IV SOLN
INTRAVENOUS | Status: DC | PRN
  Administered 2023-04-19 (×2): 80 ug via INTRAVENOUS

## 2023-04-19 MED ORDER — METRONIDAZOLE 500 MG/100ML IV SOLN: 500.0000 mg | INTRAVENOUS | Status: DC

## 2023-04-19 MED ORDER — ROCURONIUM BROMIDE 100 MG/10ML IV SOLN
INTRAVENOUS | Status: DC | PRN
  Administered 2023-04-19 (×3): 10 mg via INTRAVENOUS
  Administered 2023-04-19: 50 mg via INTRAVENOUS

## 2023-04-19 MED ORDER — PHENAZOPYRIDINE HCL 100 MG PO TABS
ORAL_TABLET | ORAL | Status: AC
  Filled 2023-04-19: qty 2

## 2023-04-19 MED ORDER — LIDOCAINE HCL (CARDIAC) PF 100 MG/5ML IV SOSY
PREFILLED_SYRINGE | INTRAVENOUS | Status: DC | PRN
  Administered 2023-04-19: 60 mg via INTRAVENOUS

## 2023-04-19 MED ORDER — PROPOFOL 10 MG/ML IV BOLUS
INTRAVENOUS | Status: DC | PRN
  Administered 2023-04-19: 150 mg via INTRAVENOUS

## 2023-04-19 MED ORDER — GABAPENTIN 300 MG PO CAPS
300.0000 mg | ORAL_CAPSULE | ORAL | Status: AC
  Administered 2023-04-19: 300 mg via ORAL

## 2023-04-19 MED ORDER — LIDOCAINE-EPINEPHRINE 1 %-1:100000 IJ SOLN
INTRAMUSCULAR | Status: DC | PRN
  Administered 2023-04-19: 3 mL

## 2023-04-19 MED ORDER — PROPOFOL 10 MG/ML IV BOLUS
INTRAVENOUS | Status: AC
  Filled 2023-04-19: qty 20

## 2023-04-19 MED ORDER — ONDANSETRON HCL 4 MG/2ML IJ SOLN: 4.0000 mg | Freq: Four times a day (QID) | INTRAMUSCULAR | Status: DC | PRN

## 2023-04-19 MED ORDER — ACETAMINOPHEN 325 MG PO TABS
ORAL_TABLET | ORAL | Status: AC
Start: 2023-04-19 — End: ?
  Filled 2023-04-19: qty 2

## 2023-04-19 MED ORDER — OXYCODONE HCL 5 MG PO TABS: 5.0000 mg | ORAL_TABLET | Freq: Once | ORAL | Status: DC | PRN

## 2023-04-19 MED ORDER — FENTANYL CITRATE (PF) 100 MCG/2ML IJ SOLN
INTRAMUSCULAR | Status: DC | PRN
  Administered 2023-04-19 (×2): 50 ug via INTRAVENOUS

## 2023-04-19 SURGICAL SUPPLY — 70 items
BLADE SURG 15 STRL LF DISP TIS (BLADE) ×6 IMPLANT
CHLORAPREP W/TINT 26 (MISCELLANEOUS) ×6 IMPLANT
COVER BACK TABLE 60X90IN (DRAPES) ×6 IMPLANT
COVER TIP SHEARS 8 DVNC (MISCELLANEOUS) ×6 IMPLANT
DEFOGGER SCOPE WARMER CLEARIFY (MISCELLANEOUS) ×6 IMPLANT
DERMABOND ADVANCED .7 DNX12 (GAUZE/BANDAGES/DRESSINGS) ×6 IMPLANT
DRAPE ARM DVNC X/XI (DISPOSABLE) ×24 IMPLANT
DRAPE COLUMN DVNC XI (DISPOSABLE) ×6 IMPLANT
DRAPE SHEET LG 3/4 BI-LAMINATE (DRAPES) ×6 IMPLANT
DRAPE SURG IRRIG POUCH 19X23 (DRAPES) ×6 IMPLANT
DRAPE UTILITY XL STRL (DRAPES) ×6 IMPLANT
DRIVER NDL LRG 8 DVNC XI (INSTRUMENTS) ×5 IMPLANT
DRIVER NDL MEGA SUTCUT DVNCXI (INSTRUMENTS) ×5 IMPLANT
DRIVER NDLE LRG 8 DVNC XI (INSTRUMENTS) ×6 IMPLANT
DRIVER NDLE MEGA SUTCUT DVNCXI (INSTRUMENTS) ×6 IMPLANT
ELECT REM PT RETURN 9FT ADLT (ELECTROSURGICAL) ×6 IMPLANT
ELECTRODE REM PT RTRN 9FT ADLT (ELECTROSURGICAL) ×5 IMPLANT
FORCEPS BPLR 8 MD DVNC XI (FORCEP) ×6 IMPLANT
GAUZE 4X4 16PLY ~~LOC~~+RFID DBL (SPONGE) ×6 IMPLANT
GLOVE BIOGEL PI IND STRL 6 (GLOVE) ×2 IMPLANT
GLOVE BIOGEL PI IND STRL 6.5 (GLOVE) ×24 IMPLANT
GLOVE BIOGEL PI IND STRL 7.0 (GLOVE) ×12 IMPLANT
GLOVE SURG SS PI 6.0 STRL IVOR (GLOVE) ×3 IMPLANT
GLOVE SURG SS PI 7.0 STRL IVOR (GLOVE) ×3 IMPLANT
GOWN STRL REUS W/ TWL LRG LVL3 (GOWN DISPOSABLE) ×1 IMPLANT
GOWN STRL REUS W/TWL LRG LVL3 (GOWN DISPOSABLE) ×7 IMPLANT
GRASPER TIP-UP FEN DVNC XI (INSTRUMENTS) ×6 IMPLANT
HOLDER FOLEY CATH W/STRAP (MISCELLANEOUS) ×6 IMPLANT
IRRIG SUCT STRYKERFLOW 2 WTIP (MISCELLANEOUS) ×6 IMPLANT
IRRIGATION SUCT STRKRFLW 2 WTP (MISCELLANEOUS) ×5 IMPLANT
IV NS 1000ML BAXH (IV SOLUTION) ×1 IMPLANT
KIT PINK PAD W/HEAD ARE REST (MISCELLANEOUS) ×6 IMPLANT
KIT PINK PAD W/HEAD ARM REST (MISCELLANEOUS) ×5 IMPLANT
KIT TURNOVER CYSTO (KITS) ×6 IMPLANT
LEGGING LITHOTOMY PAIR STRL (DRAPES) ×6 IMPLANT
MANIFOLD NEPTUNE II (INSTRUMENTS) ×6 IMPLANT
MANIPULATOR ADVINCU DEL 3.0 PL (MISCELLANEOUS) ×1 IMPLANT
MESH VERTESSA LITE -Y 2X4X3 (Mesh General) ×1 IMPLANT
NDL HYPO 22X1.5 SAFETY MO (MISCELLANEOUS) ×5 IMPLANT
NDL INSUFFLATION 14GA 120MM (NEEDLE) ×5 IMPLANT
NEEDLE HYPO 22X1.5 SAFETY MO (MISCELLANEOUS) ×6 IMPLANT
NEEDLE INSUFFLATION 14GA 120MM (NEEDLE) ×6 IMPLANT
OBTURATOR OPTICAL STND 8 DVNC (TROCAR) ×6 IMPLANT
OBTURATOR OPTICALSTD 8 DVNC (TROCAR) ×5 IMPLANT
PACK ROBOT WH (CUSTOM PROCEDURE TRAY) ×6 IMPLANT
PACK ROBOTIC GOWN (GOWN DISPOSABLE) ×6 IMPLANT
PAD OB MATERNITY 4.3X12.25 (PERSONAL CARE ITEMS) ×6 IMPLANT
PAD PREP 24X48 CUFFED NSTRL (MISCELLANEOUS) ×6 IMPLANT
PROTECTOR NERVE ULNAR (MISCELLANEOUS) ×6 IMPLANT
SCISSORS MNPLR CVD DVNC XI (INSTRUMENTS) ×6 IMPLANT
SCRUB CHG 4% DYNA-HEX 4OZ (MISCELLANEOUS) ×7 IMPLANT
SEAL UNIV 5-12 XI (MISCELLANEOUS) ×30 IMPLANT
SEALER VESSEL EXT DVNC XI (MISCELLANEOUS) ×1 IMPLANT
SET IRRIG Y TYPE TUR BLADDER L (SET/KITS/TRAYS/PACK) ×6 IMPLANT
SET TUBE SMOKE EVAC HIGH FLOW (TUBING) ×6 IMPLANT
SLEEVE SCD COMPRESS KNEE MED (STOCKING) ×6 IMPLANT
SPIKE FLUID TRANSFER (MISCELLANEOUS) ×12 IMPLANT
SUT GORETEX NAB #0 THX26 36IN (SUTURE) ×7 IMPLANT
SUT MNCRL AB 4-0 PS2 18 (SUTURE) ×12 IMPLANT
SUT MON AB 2-0 SH 27 (SUTURE) ×6 IMPLANT
SUT V-LOC BARB 180 2/0GR9 GS23 (SUTURE) ×18 IMPLANT
SUT VIC AB 0 CT1 27XBRD ANBCTR (SUTURE) ×2 IMPLANT
SUT VIC AB 2-0 SH 27XBRD (SUTURE) ×7 IMPLANT
SUT VIC AB 3-0 SH 27XBRD (SUTURE) ×1 IMPLANT
SUT VLOC 180 0 9IN GS21 (SUTURE) ×6 IMPLANT
SUTURE V-LC BRB 180 2/0GR9GS23 (SUTURE) ×10 IMPLANT
SYR BULB EAR ULCER 3OZ GRN STR (SYRINGE) ×6 IMPLANT
TOWEL OR 17X24 6PK STRL BLUE (TOWEL DISPOSABLE) ×6 IMPLANT
TRAY FOLEY W/BAG SLVR 14FR LF (SET/KITS/TRAYS/PACK) ×1 IMPLANT
WATER STERILE IRR 500ML POUR (IV SOLUTION) ×1 IMPLANT

## 2023-04-19 NOTE — Anesthesia Preprocedure Evaluation (Addendum)
 Anesthesia Evaluation  Patient identified by MRN, date of birth, ID band Patient awake    Reviewed: Allergy & Precautions, NPO status , Patient's Chart, lab work & pertinent test results  Airway Mallampati: II  TM Distance: >3 FB Neck ROM: Full    Dental  (+) Dental Advisory Given, Teeth Intact   Pulmonary neg pulmonary ROS   Pulmonary exam normal breath sounds clear to auscultation       Cardiovascular negative cardio ROS Normal cardiovascular exam Rhythm:Regular Rate:Normal     Neuro/Psych  Headaches PSYCHIATRIC DISORDERS Anxiety Depression       GI/Hepatic negative GI ROS, Neg liver ROS,,,  Endo/Other  negative endocrine ROS    Renal/GU negative Renal ROS     Musculoskeletal negative musculoskeletal ROS (+)    Abdominal   Peds  Hematology  (+) Blood dyscrasia, anemia   Anesthesia Other Findings   Reproductive/Obstetrics                             Anesthesia Physical Anesthesia Plan  ASA: 2  Anesthesia Plan: General   Post-op Pain Management: Tylenol  PO (pre-op)* and Gabapentin  PO (pre-op)*   Induction: Intravenous  PONV Risk Score and Plan: 4 or greater and Ondansetron , Dexamethasone , Treatment may vary due to age or medical condition, Midazolam , Scopolamine  patch - Pre-op and Propofol  infusion  Airway Management Planned: Oral ETT  Additional Equipment:   Intra-op Plan:   Post-operative Plan: Extubation in OR  Informed Consent: I have reviewed the patients History and Physical, chart, labs and discussed the procedure including the risks, benefits and alternatives for the proposed anesthesia with the patient or authorized representative who has indicated his/her understanding and acceptance.     Dental advisory given  Plan Discussed with: CRNA  Anesthesia Plan Comments:         Anesthesia Quick Evaluation

## 2023-04-19 NOTE — Interval H&P Note (Signed)
 History and Physical Interval Note:  04/19/2023 10:47 AM  Jordan [name]  has presented today for surgery, with the diagnosis of anterior vaginal prolapse; uterovaginal prolapase incomplete; stress urinary incontinence.  The various methods of treatment have been discussed with the patient and family. After consideration of risks, benefits and other options for treatment, the patient has consented to  Procedure(s): XI ROBOTIC ASSISTED TOTAL HYSTERECTOMY WITH BILATERAL SALPINGECTOMY AND  SACROCOLPOPEXY (N/A) PERINEOPLASTY--POSSIBLE (N/A) CYSTOSCOPY (N/A) EXAM UNDER ANESTHESIA as a surgical intervention.  The patient's history has been reviewed, patient examined, no change in status, stable for surgery.  I have reviewed the patient's chart and labs.  Questions were answered to the patient's satisfaction.     [name]

## 2023-04-19 NOTE — Transfer of Care (Signed)
 Immediate Anesthesia Transfer of Care Note  Patient: Jordan [name]  Procedure(s) Performed: XI ROBOTIC ASSISTED TOTAL HYSTERECTOMY WITH BILATERAL SALPINGECTOMY, SACROCOLPOPEXY AND BLADDER REPAIR (Pelvis) PERINEOPLASTY (Perineum) CYSTOSCOPY (Bladder) EXAM UNDER ANESTHESIA (Vagina ) XI ROBOTIC ASSISTED LAPAROSCOPIC OVARIAN CYSTECTOMY (Left)  Patient Location: PACU  Anesthesia Type:General  Level of Consciousness: awake, alert , and patient cooperative  Airway & Oxygen Therapy: Patient Spontanous Breathing and Patient connected to nasal cannula oxygen  Post-op Assessment: Report given to RN and Post -op Vital signs reviewed and stable  Post vital signs: Reviewed and stable  Last Vitals:  Vitals Value Taken Time  BP 120/81 04/19/23 1445  Temp 36.5 C 04/19/23 1443  Pulse 92 04/19/23 1447  Resp 17 04/19/23 1447  SpO2 100 % 04/19/23 1447  Vitals shown include unfiled device data.  Last Pain:  Vitals:   04/19/23 0952  TempSrc: Oral  PainSc: 0-No pain      Patients Stated Pain Goal: 3 (04/19/23 9047)  Complications: No notable events documented.

## 2023-04-19 NOTE — Anesthesia Procedure Notes (Signed)
 Procedure Name: Intubation Date/Time: 04/19/2023 11:14 AM  Performed by: Carolann Brazell D, CRNAPre-anesthesia Checklist: Patient identified, Emergency Drugs available, Suction available and Patient being monitored Patient Re-evaluated:Patient Re-evaluated prior to induction Oxygen Delivery Method: Circle system utilized Preoxygenation: Pre-oxygenation with 100% oxygen Induction Type: IV induction Ventilation: Mask ventilation without difficulty Laryngoscope Size: Mac and 3 Grade View: Grade I Tube type: Oral Tube size: 7.0 mm Number of attempts: 1 Airway Equipment and Method: Stylet and Oral airway Placement Confirmation: ETT inserted through vocal cords under direct vision, positive ETCO2 and breath sounds checked- equal and bilateral Secured at: 20 cm Tube secured with: Tape Dental Injury: Teeth and Oropharynx as per pre-operative assessment

## 2023-04-19 NOTE — Op Note (Addendum)
 Operative Note  Preoperative Diagnosis: anterior vaginal prolapse, posterior vaginal prolapse, uterovaginal prolapse, incomplete  Postoperative Diagnosis: same  Procedures performed:  Robotic assisted total laparoscopic hysterectomy, bilateral salpingectomy, left ovarian cystectomy, sacrocolpopexy Lucita Lite Y), repair of cystotomy, cystoscopy, perineorraphy  Implants:  Implant Name Type Inv. Item Serial No. Manufacturer Lot No. LRB No. Used Action  MESH LAJUANDA MAJORIE SE [id number] Mesh General MESH LAJUANDA MAJORIE SE NITA  [facility] MEDICAL [phone] N/A 1 Implanted    Attending Surgeon: Rosaline Caper, MD  Assistant: Daine Abbot, RNFA  Anesthesia: General endotracheal  Findings: 1. On vaginal exam, stage II prolapse present  2. On cystoscopy, normal bladder and urethral mucosa. Repaired cystotomy at the posterior right dome, watertight.  Brisk bilateral ureteral efflux present.    3. On laparoscopy, normal appearing uterus and bilateral fallopian tubes. Normal appearing right ovary. Left ovary with 4cm simple appearing ovarian cyst. No significant adhesions present.   Specimens:  ID Type Source Tests Collected by Time Destination  1 : cervix, uterus, bilateral fallopian tubes, left ovarian cyst Tissue PATH Gyn benign resection SURGICAL PATHOLOGY Caper Rosaline SAILOR, MD 04/19/2023 1226     Estimated blood loss: 50 mL  IV fluids: 800 mL  Urine output: 150 mL  Complications: Inadvertent cystotomy on posterior bladder dome with retraction of bladder tissue. Repaired with 3-0 vicryl.   Procedure in Detail:  After informed consent was obtained, the patient was taken to the operating room, where general anesthesia was induced and found to be adequate. She was placed in dorsolithotomy position in yellowfin stirrups. Her hips were noted not to be hyperflexed or hyperextended. Her arms were padded with gel pads and tucked to her sides. Her hands were surrounded by foam. A  padded strap was placed across her chest with foam between the pad and her skin. She was noted to be appropriately positioned with all pressure points well padded and off tension. A tilt test showed no slippage. She was prepped and draped in the usual sterile fashion. A uterine manipulator was placed in the uterus after sounding to 9 cm, an appropriately sized Koh ring was placed around the cervix, and a pneumo-occluder balloon was positioned in the vagina for later use.  A sterile Foley catheter was inserted.   0.25% plain Marcaine  was injected in the supraumbilical  area and an 8 mm supraumbilical skin incision was made with the scalpel.  A Veress needle was inserted into the incision, CO2 insufflation was started, a low opening pressure was noted, and pneumoperitoneum was obtained. The Veress needle was removed and a 8mm robotic trocar was placed. The robotic camera was inserted and intraperitoneal placement was confirmed. Survey of the abdomen and pelvis revealed the findings as noted above. The sacrum appeared to be free of any adhesive disease. After determining placement for the other ports, Local anesthetic was injected at each site and two 8 mm incisions were made for robotic ports at 10 cm lateral to and at the level of the umbilical port. Two additional 8 mm incisions were made 10 cm lateral to these and 30 degrees down followed by 8 mm robotic ports - the right side for an assistant port. All trocars were placed sequentially under direct visualization of the camera. The patient was placed in Trendelenburg. The robot was docked on the patient's right side. Monopolar endoshears alternating with the vessel sealer was placed in the right arm, a [place]  bipolar grasper was placed in the 2nd arm of the patient's left side,  and a Tip up grasper was placed in the 3rd arm on the patient's left side.   Attention was then turned to the sacral promontory. The peritoneum overlying the sacral promontory was  tented up, dissected sharply with monopolar scissors and electrosurgery using layer by layer technique. The peritoneal incision was extended down to the posterior cul-de-sac. This was performed with care to avoid the ureter on the right side and the sigmoid colon and its mesentary on the left side. Two transverse sutures of CV2 Gortex were placed in the anterior longitudinal ligament.  Attention was then turned to the robotic hysterectomy. The ureter was identified and was found to be well away from the planned site of incision. The left ovary was noted to have a 4cm simple cyst. The ovary was incised over the cyst with monopolar scissors and gently dissected off to reveal the cyst wall. Traction and countertraction was used to shell out the cyst. The cyst was incised and clear fluid was drained. The cyst wall was then removed from the ovary. Hemostasis was achieved in the cyst bed with cautery. The cyst wall was handed off the field. The right mesosalpinx was then cauterized and cut. The uteroovarian was cauterized and cut. The right round ligament was grasped, cauterized, and transected with electrocautery. The anterior and posterior leaves of the broad ligament were taken down with cautery and sharp dissection. The uterine artery was skeletonized and the bladder flap was created on the right side with a combination of electrosurgery and sharp dissection. The KOH ring was identified. The right uterine artery was clamped, cauterized, and transected. In a similar fashion, the left side was taken down. Further sharp dissection with combination of cautery was performed to further develop the bladder flap. At this point, the KOH ring was completely hugging the cervix. The pneumo-occluder balloon in the vagina was inflated to maintain pneumoperitoneum. A colpotomy was performed with electrosurgical cutting current and the uterus and cervix were completely amputated from the vagina. The specimen was delivered through the  vagina. The posterior portion of the vaginal cuff was then grasped and pulled up to maintain pneumoperitoneum. The pneumo-occluder balloon was then replaced in the vagina. The right hand instrument was changed to a suture-cut needle driver. The vaginal cuff was then closed using a 0 V-loc suture in two layers.    The right hand instrument was replaced with monopolar endoshears. With a lucite probe in the vagina, the anterior vaginal dissection was then performed with sharp dissection and electrosurgery. While tenting up with bladder, an inadvertent cystotomy was made, approximately 0.5cm. This was repaired with a 3-0 vicryl suture in a running fashion to close the bladder mucosa. The bladder was backfilled with 150cc and was noted to be water  tight. The water  was removed and the foley replaced. A second imbricating layer of 3-0 vicryl running suture was placed. Good hemostasis was noted.  The posterior vaginal dissection was then performed with sharp dissection and electrosurgery in order to dissect the rectum away from the posterior vagina.  A Y mesh was then inserted into the abdomen after trimming to appropriate size. With the probe in the vagina, the anterior leaf of the Y mesh was affixed to the anterior portion of the vagina using a 2-0 v-loc suture in a spiral pattern to distribute the suture evenly across the surface of the anterior mesh leaf. In a similar fashion, the posterior leaf of the Y mesh was attached to the posterior surface of the vagina with 2-0  v-loc suture. At this time, a hole was noted in the posterior vagina just below the cuff sutures. A 2-0 v-loc suture was used to close this area and incorporate it into the cuff.  The distal end of the mesh was then brought to overlie the sacrum. The correct amount of tension was determined in order to elevate the vagina, but not put the mesh under tension. The distal end of the mesh was then affixed to the anterior longitudinal sacral ligament using  two interrupted transverse stitches of CV2 Gortex. The excess distal mesh was then cut and removed. The peritoneum was reapproximated over the mesh using 2-0 monocryl. The bladder flap was incorporated to completely retroperitonealize the mesh. All pedicles were carefully inspected and noted to be hemostatic as the CO2 gas was deflated. All instruments were removed from the patient's abdomen.   The Foley catheter was removed.  A 70-degree cystoscope was introduced, and 360-degree inspection revealed the previously repaired cystotomy and otherwise normal mucosa. Brisk bilateral ureteral efflux was noted with the assistance of pyridium .  The bladder was drained and the cystoscope was removed.  The Foley catheter was replaced.  The robot was undocked. The CO2 gas was removed and the ports were removed.  The skin incisions were closed with subcutaneous stitches of 4-0 Monocryl and covered with skin glue.   Attention was then turned to the perineum. Two allis clamps were placed at the introitus. The perineum was injected with 1% lidocaine  with epinephrine . A diamond shaped incision was made over the perineum and excess skin was removed. Dissection was performed with Metzenbaum scissors to separate the mucosa from the underlying tissue.  Two small sebaceous cysts were identified and removed. The perineal body was then reapproximated with two interrupted 0-vicryl sutures. The perineal skin was then closed with a 2-0 vicryl in a subcutaneous and subcuticular fashion. Some bleeding was noted higher in the vagina. A deaver was placed and the sutured area below the cuff appeared to have a raw edge. A 2-0 vicryl figure of eight suture was placed.  Hemostasis was noted.  The patient tolerated the procedure well. She was awakened from anesthesia and transferred to the recovery room in stable condition. Sponge, lap, and needle counts were correct x 2.   [name], MD

## 2023-04-19 NOTE — Discharge Instructions (Signed)

## 2023-04-19 NOTE — Anesthesia Postprocedure Evaluation (Signed)
 Anesthesia Post Note  Patient: Jordan [name]  Procedure(s) Performed: XI ROBOTIC ASSISTED TOTAL HYSTERECTOMY WITH BILATERAL SALPINGECTOMY, SACROCOLPOPEXY AND BLADDER REPAIR (Pelvis) PERINEOPLASTY (Perineum) CYSTOSCOPY (Bladder) EXAM UNDER ANESTHESIA (Vagina ) XI ROBOTIC ASSISTED LAPAROSCOPIC OVARIAN CYSTECTOMY (Left)     Patient location during evaluation: PACU Anesthesia Type: General Level of consciousness: awake and alert Pain management: pain level controlled Vital Signs Assessment: post-procedure vital signs reviewed and stable Respiratory status: spontaneous breathing, nonlabored ventilation, respiratory function stable and patient connected to nasal cannula oxygen Cardiovascular status: blood pressure returned to baseline and stable Postop Assessment: no apparent nausea or vomiting Anesthetic complications: no   No notable events documented.  Last Vitals:  Vitals:   04/19/23 1530 04/19/23 1550  BP: 114/68 107/67  Pulse: 65 75  Resp: 17 18  Temp: 36.5 C 36.4 C  SpO2: 100% 99%    Last Pain:  Vitals:   04/19/23 1550  TempSrc:   PainSc: 4                  Butler Levander Pinal

## 2023-04-20 ENCOUNTER — Encounter (HOSPITAL_BASED_OUTPATIENT_CLINIC_OR_DEPARTMENT_OTHER): Payer: Self-pay | Admitting: Obstetrics and Gynecology

## 2023-04-20 ENCOUNTER — Telehealth: Payer: Self-pay | Admitting: Obstetrics and Gynecology

## 2023-04-20 LAB — SURGICAL PATHOLOGY

## 2023-04-20 NOTE — Telephone Encounter (Signed)
 Jordan [name] underwent Robotic assisted total laparoscopic hysterectomy, bilateral salpingectomy, left ovarian cystectomy, sacrocolpopexy, repair of cystotomy, cystoscopy, perineorraphy  on 04/19/23.   Due to bladder injury, she was sent home with a catheter. Please call her for a routine post op check and to schedule a voiding trial for 04/29/23. Thanks!  Arma Lamp, MD

## 2023-04-20 NOTE — Telephone Encounter (Signed)
 Pt called in this morning to set up voiding trial.  Set up 1/24 @ 9:30 per your instructions

## 2023-04-25 NOTE — Telephone Encounter (Signed)
Called and spoke to patient: She reports she is only taking Tylenol and Ibuprofen as needed and her pain is well controlled.  Denies heavy bleeding or clots.  States she has been able to have a bowel movement.  She has a catheter in place and reports it is mildly irritative and she feels she is peeing around it some, but that the bag is draining.  Patient reports she will call with any concerns and overall reports she is doing well.

## 2023-04-28 ENCOUNTER — Ambulatory Visit (INDEPENDENT_AMBULATORY_CARE_PROVIDER_SITE_OTHER): Payer: 59

## 2023-04-28 DIAGNOSIS — Z48816 Encounter for surgical aftercare following surgery on the genitourinary system: Secondary | ICD-10-CM

## 2023-04-28 NOTE — Patient Instructions (Signed)
Keep for future Post op appointment.

## 2023-04-28 NOTE — Progress Notes (Signed)
Urogyn Nurse Voiding Trial Note  Jordan [name] underwent XI ROBOTIC ASSISTED TOTAL HYSTERECTOMY WITH BILATERAL SALPINGECTOMY, SACROCOLPOPEXY AND BLADDER REPAIR (Pelvis)  PERINEOPLASTY (Perineum)  CYSTOSCOPY (Bladder)  EXAM UNDER ANESTHESIA (Vagina )  XI ROBOTIC ASSISTED LAPAROSCOPIC OVARIAN CYSTECTOMY (Left)  on 04/19/2023.  She presents today for a voiding trial .   Patient was identified with 2 indicators. of NS was instilled into the bladder via the catheter. The catheter was removed and patient was instructed to void into the urinary hat. She voided . The post void residual measured by bladder scan was 8ml. She passed the voiding trial and a catheter was not replaced.   The patient received aftercare instructions and will follow up as scheduled.

## 2023-04-29 ENCOUNTER — Ambulatory Visit: Payer: 59

## 2023-05-03 DIAGNOSIS — F4312 Post-traumatic stress disorder, chronic: Secondary | ICD-10-CM | POA: Diagnosis not present

## 2023-05-03 DIAGNOSIS — F4010 Social phobia, unspecified: Secondary | ICD-10-CM | POA: Diagnosis not present

## 2023-05-03 DIAGNOSIS — F331 Major depressive disorder, recurrent, moderate: Secondary | ICD-10-CM | POA: Diagnosis not present

## 2023-05-11 ENCOUNTER — Encounter: Payer: Self-pay | Admitting: Obstetrics and Gynecology

## 2023-05-13 ENCOUNTER — Encounter: Payer: Self-pay | Admitting: Obstetrics and Gynecology

## 2023-05-31 ENCOUNTER — Encounter: Payer: 59 | Admitting: Obstetrics and Gynecology

## 2023-06-02 ENCOUNTER — Encounter: Payer: Self-pay | Admitting: Obstetrics and Gynecology

## 2023-06-02 ENCOUNTER — Ambulatory Visit (INDEPENDENT_AMBULATORY_CARE_PROVIDER_SITE_OTHER): Payer: 59 | Admitting: Obstetrics and Gynecology

## 2023-06-02 VITALS — BP 108/62

## 2023-06-02 DIAGNOSIS — Z9889 Other specified postprocedural states: Secondary | ICD-10-CM

## 2023-06-02 DIAGNOSIS — Z48816 Encounter for surgical aftercare following surgery on the genitourinary system: Secondary | ICD-10-CM

## 2023-06-02 NOTE — Progress Notes (Signed)
 [name]  Date of Visit: 06/02/2023  History of Present Illness: [name] is a 33 y.o. female scheduled today for a post-operative visit.   Surgery: s/p Robotic assisted total laparoscopic hysterectomy, bilateral salpingectomy, left ovarian cystectomy, sacrocolpopexy [name] Y), repair of cystotomy, cystoscopy, perineorraphy on 04/19/23  Surgery complicated by cystotomy which required prolonged catheter. Passed voiding trial on 04/28/23  Postoperative course has otherwise been uncomplicated.   Today she reports she is feeling well. Right after she had a the catheter removed she became sick and had a lot of coughing. She maybe thought she felt a bulge again and had large gushes of urine that she could not control. For the last two weeks has not noticed any leakage (cough has resolved).   UTI in the last 6 weeks? No  Pain? No  She has returned to her normal activity (except for postop restrictions) Vaginal bulge? No  Stress incontinence:  not currently but earlier in post op period with coughing Urgency/frequency: No  Urge incontinence: No  Voiding dysfunction: No  Bowel issues: No   Subjective Success: Do you usually have a bulge or something falling out that you can see or feel in the vaginal area? No  Retreatment Success: Any retreatment with surgery or pessary for any compartment? No   Pathology results: UTERUS, CERVIX, BILATERAL FALLOPIAN TUBES, LEFT OVARIAN CYST,  RESECTION:  Cervix     Hyperkeratosis and slight cervicitis      Negative for dysplasia  Endometrium     Secretory      Negative for endometrial intraepithelial neoplasia or malignancy  Myometrium     Unremarkable      Negative for malignancy  Uterine serosa      Unremarkable      Negative for endometriosis or malignancy  Left ovarian cyst      Hemorrhagic corpus luteum cyst      Negative for endometriosis or malignancy  Bilateral fallopian tubes      Unremarkable      Negative for  endometriosis or malignancy   Medications: She has a current medication list which includes the following prescription(s): acetaminophen, ibuprofen, and nurtec.   Allergies: Patient is allergic to latex.   Physical Exam: BP 108/62 (BP Location: Left Arm, Patient Position: Sitting, Cuff Size: Normal)   Abdomen: soft, non-tender, without masses or organomegaly Laparoscopic Incisions: healing well.  Pelvic Examination: Vagina: cuff is well healed. Suture is present but no granulation tissue. Loop of suture removed from posterior vaginal wall with scissors.  No tenderness along the anterior or posterior vagina. No apical tenderness. No pelvic masses. No visible or palpable mesh.  POP-Q: POP-Q  -3                                            Aa   -3                                           Ba  -7.5                                              C   2.5  Gh  3.5                                            Pb  7.5                                            tvl   -2.5                                            Ap  -2.5                                            Bp                                                 D    ---------------------------------------------------------  Assessment and Plan:  1. Post-operative state     - Pathology results were reviewed with the patient today and she verbalized understanding that the results were benign.  - Can resume regular activity including exercise.Discussed avoidance of heavy lifting and straining long term to reduce the risk of recurrence.  Wait an additional 6 weeks for intercourse.  - We discussed that during the surgery her tissue appeared very fragile, but she has had good anatomical result in the end.  - Leakage was probably of a result of the healing and recent catheter use since it resolved over the last few weeks. However, as she is becoming more active, if she develops more incontinence  symptoms then she should return to discuss further treatment options. She has done pelvic PT previously and will incorporate some of these exercises into her regimen.  - Recommend annual follow up with her general gynecologist. Will not need pap smears.   Return as needed  Jordan Beards, MD

## 2023-06-02 NOTE — Patient Instructions (Signed)
 Ok to return to normal activity and exercise.   Wait 6 more weeks before sex.   Call with any questions or concerns!

## 2023-07-30 ENCOUNTER — Encounter (INDEPENDENT_AMBULATORY_CARE_PROVIDER_SITE_OTHER): Payer: Self-pay

## 2023-09-21 ENCOUNTER — Ambulatory Visit (INDEPENDENT_AMBULATORY_CARE_PROVIDER_SITE_OTHER): Payer: 59 | Admitting: Adult Health

## 2023-09-21 ENCOUNTER — Other Ambulatory Visit (HOSPITAL_COMMUNITY): Payer: Self-pay

## 2023-09-21 VITALS — BP 115/76 | HR 65 | Ht <= 58 in | Wt 140.0 lb

## 2023-09-21 DIAGNOSIS — G43109 Migraine with aura, not intractable, without status migrainosus: Secondary | ICD-10-CM

## 2023-09-21 MED ORDER — NURTEC 75 MG PO TBDP
1.0000 | ORAL_TABLET | Freq: Every day | ORAL | 11 refills | Status: AC
  Filled 2023-09-21: qty 8, 30d supply, fill #0
  Filled 2023-10-31: qty 15, 30d supply, fill #0

## 2023-09-21 NOTE — Progress Notes (Signed)
 PATIENT: Jordan [name] DOB: Jan 27, 1991  REASON FOR VISIT: follow up HISTORY FROM: patient  Chief Complaint  Patient presents with   Follow-up    Rm 9, migraines. Stable.  Nurtec works for her.  Her nurtec prescription can be renewed if continued.     HISTORY OF PRESENT ILLNESS: Today 09/21/23:  Jordan [name] is a 33 y.o. female with a history of migraine headaches. Returns today for follow-up.  She states overall her migraines have remained the same.  She has approximately 1-3 migraines a month.  She typically can take Nurtec and get good relief.  She does not wish to be on a daily medication.  She states that she always loses vision in her left eye with headache on the left side.  She returns today for an evaluation.  09/14/22: Jordan [name] is a 33 y.o. female with a history of Migraine headaches. Returns today for follow-up. Has a migraine 1-3 times a month. May last a couple of hours-up to 1 day. Has weekly mild headaches that she can't take tylenol . Still happy with this management. Does not wish to be on a daily medication.   03/02/22: Jordan [name] is a 33 year old female with a history of migraine headaches.  She returns today for follow-up. Headaches are 1-2 a week that can be manages with tylenol . Has a true migraine 1-2 a month.  Reports that if she uses Maxalt  she has to lay down and go to sleep.  She would like to try another abortive medication  02/24/21:Jordan [name] is a 33 year old female with a history of migraine headaches.  She returns today for follow-up.  She states that she stopped nortriptyline  because she felt that it made her mood worse.  She states that she felt more sad.  Felt it also made her sleepy.  She continues to have 2-3 mild headaches a week.  They typically respond well to Tylenol  or Advil .  She has severe headaches a week of her menstrual cycle.  She does use Maxalt  for severe headaches but reports that it does cause her to feel nauseous.  07/15/20: Jordan [name] is a 33 year old female with a history of migraine headaches.  She returns today for follow-up.  She states that she is no longer breast-feeding.  She has approximately 1-3 headaches a week.  These are typically mild headaches that respond well to Tylenol .  She has approximately 2 severe headaches a month that does not respond to medication.  Her severe headaches usually occur around her menstrual cycle.  In the past nortriptyline  and Maxalt  have worked well for her.   04/17/20:Jordan [name] is a 33 year old female with a history of migraine headaches.  She returns today for follow-up.  She is currently breast-feeding.  She states that she is having approximately 2-3 migraines a week.  They typically occur above the left eye.  She does have photophobia and phonophobia as well as nausea but no vomiting.  She is interested in trying preventative medication as long as safe during breast-feeding.  09/20/16: Jordan [name] is a 33 year old right-handed white female with a history of migraine headaches since she was a child. As time has gone on, the headaches become more frequent and longer in duration. She is now having 2 or 3 headaches a month, but the headaches may last anywhere from 2 up to 5-6 days. The patient is having greater than 10 headache days a month, the headaches may be quite severe at times and she  may have to miss work. The headaches are generally on the left frontotemporal area and are associated with nausea and vomiting, the patient may have some decreased hearing in left ear and may have visual loss in the left eye only. The patient does have photophobia and phonophobia with the headache, she has dizziness and sometimes vertigo, she may have difficulty with cognitive processing and difficulty with slurred speech. She may have some problems with gait instability with the headache. She feels, weak all over at times. The patient reports that she usually has a headache just before her menstrual cycle,  but she may have headaches at other times as well and she is not sure of any other activating factors. She denies a family history of migraine. She is taking Imitrex  50 mg tablets for the headache, this seems to help some but does not completely eliminate the headache. She has had MRI evaluation of the brain in July 2012 that was normal. She is sent to this office for an evaluation. In the past, she has been treated with propranolol without benefit with the headache. She has not been on any other daily prophylactic medications.    REVIEW OF SYSTEMS: Out of a complete 14 system review of symptoms, the patient complains only of the following symptoms, and all other reviewed systems are negative.  See HPI  ALLERGIES: Allergies  Allergen Reactions   Latex Swelling and Rash    HOME MEDICATIONS: Outpatient Medications Prior to Visit  Medication Sig Dispense Refill   acetaminophen  (TYLENOL ) 500 MG tablet Take 1 tablet (500 mg total) by mouth every 6 (six) hours as needed (pain). 30 tablet 0   ibuprofen  (ADVIL ) 600 MG tablet Take 1 tablet (600 mg total) by mouth every 6 (six) hours as needed. 30 tablet 0   Rimegepant Sulfate (NURTEC) 75 MG TBDP Take 1 tab at the onset of a migraine. Only 1 tab in 24 hours. (Patient taking differently: Take by mouth as needed. Take 1 tab at the onset of a migraine. Only 1 tab in 24 hours.) 15 tablet 11   No facility-administered medications prior to visit.    PAST MEDICAL HISTORY: Past Medical History:  Diagnosis Date   Anemia    Anxiety    MDD (major depressive disorder)    Migraine with aura and without status migrainosus, not intractable    followed by [facility]--- Cala Castleman NP   Prolapse of female pelvic organs    anterior vaginal wall/ incomplete uterovaginal wall   SUI (stress urinary incontinence, female)    Wears glasses     PAST SURGICAL HISTORY: Past Surgical History:  Procedure Laterality Date   CYSTOSCOPY N/A 04/19/2023    Procedure: CYSTOSCOPY;  Surgeon: Arma Lamp, MD;  Location: [facility];  Service: Gynecology;  Laterality: N/A;   NO PAST SURGERIES     PERINEOPLASTY N/A 04/19/2023   Procedure: PERINEOPLASTY;  Surgeon: Arma Lamp, MD;  Location: [facility];  Service: Gynecology;  Laterality: N/A;   ROBOTIC ASSISTED LAPAROSCOPIC OVARIAN CYSTECTOMY Left 04/19/2023   Procedure: XI ROBOTIC ASSISTED LAPAROSCOPIC OVARIAN CYSTECTOMY;  Surgeon: Arma Lamp, MD;  [facility]  Service: Gynecology;  Laterality: Left;   WISDOM TOOTH EXTRACTION  08/04/2010   XI ROBOTIC ASSISTED TOTAL HYSTERECTOMY WITH SACROCOLPOPEXY N/A 04/19/2023   Procedure: XI ROBOTIC ASSISTED TOTAL HYSTERECTOMY WITH BILATERAL SALPINGECTOMY, SACROCOLPOPEXY AND BLADDER REPAIR;  Surgeon: Arma Lamp, MD;  Location: [facility];  Service: Gynecology;  Laterality: N/A;    FAMILY HISTORY: Family History  Problem Relation Age of Onset   Hypertension Father    Hyperlipidemia Father    Cancer Maternal Grandmother    Cancer Maternal Grandfather    Cancer Paternal Grandmother     SOCIAL HISTORY: Social History   Socioeconomic History   Marital status: Married    Spouse name: Lyell Samuel   Number of children: 2   Years of education: BSN   Highest education level: Bachelor's degree (e.g., BA, AB, BS)  Occupational History   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  Tobacco Use   Smoking status: Never   Smokeless tobacco: Never  Vaping Use   Vaping status: Never Used  Substance and Sexual Activity   Alcohol use: Not Currently   Drug use: Never   Sexual activity: Yes    Birth control/protection: Other-see comments    Comment: Vasectomy  Other Topics Concern   Not on file  Social History Narrative   Nurse at [employer]   Married with 2 children   Caffeine use: 3-4 times per week Albin Huh)   Right handed   Social Drivers of Health   Financial  Resource Strain: Low Risk  (12/10/2022)   Overall Financial Resource Strain (CARDIA)    Difficulty of Paying Living Expenses: Not hard at all  Food Insecurity: No Food Insecurity (12/10/2022)   Hunger Vital Sign    Worried About Running Out of Food in the Last Year: Never true    Ran Out of Food in the Last Year: Never true  Transportation Needs: No Transportation Needs (12/10/2022)   PRAPARE - Administrator, Civil Service (Medical): No    Lack of Transportation (Non-Medical): No  Physical Activity: Inactive (12/10/2022)   Exercise Vital Sign    Days of Exercise per Week: 0 days    Minutes of Exercise per Session: 0 min  Stress: No Stress Concern Present (12/10/2022)   [employer] of Occupational Health - Occupational Stress Questionnaire    Feeling of Stress : Not at all  Recent Concern: Stress - Stress Concern Present (12/04/2022)   [employer] of Occupational Health - Occupational Stress Questionnaire    Feeling of Stress : To some extent  Social Connections: Moderately Integrated (12/10/2022)   Social Connection and Isolation Panel    Frequency of Communication with Friends and Family: More than three times a week    Frequency of Social Gatherings with Friends and Family: More than three times a week    Attends Religious Services: 1 to 4 times per year    Active Member of [employer] or Organizations: No    Attends Banker Meetings: Never    Marital Status: Married  Recent Concern: Social Connections - Socially Isolated (12/04/2022)   Social Connection and Isolation Panel    Frequency of Communication with Friends and Family: Never    Frequency of Social Gatherings with Friends and Family: Never    Attends Religious Services: Never    Database administrator or Organizations: No    Attends Engineer, structural: Not on file    Marital Status: Married  Catering manager Violence: Not At Risk (12/10/2022)   Humiliation, Afraid, Rape, and Kick questionnaire     Fear of Current or Ex-Partner: No    Emotionally Abused: No    Physically Abused: No    Sexually Abused: No      PHYSICAL EXAM Generalized: Well developed, in no acute distress   Neurological examination  Mentation: Alert oriented to time, place, history taking. Follows all commands speech and language fluent Cranial nerve II-XII: Facial symmetry noted.  DIAGNOSTIC DATA (LABS, IMAGING, TESTING) - I reviewed patient records, labs, notes, testing and imaging myself where available.  Lab Results  Component Value Date   WBC 5.8 04/13/2023   HGB 13.0 04/13/2023   HCT 38.4 04/13/2023   MCV 94.3 04/13/2023   PLT 265 04/13/2023      Component Value Date/Time   NA 142 05/28/2022 1141   K 4.2 05/28/2022 1141   CL 106 05/28/2022 1141   CO2 26 05/28/2022 1141   GLUCOSE 61 (L) 05/28/2022 1141   BUN 11 05/28/2022 1141   CREATININE 0.63 05/28/2022 1141   CALCIUM 9.2 05/28/2022 1141   PROT 6.0 (L) 05/28/2022 1141   AST 12 05/28/2022 1141   ALT 8 05/28/2022 1141   BILITOT 0.4 05/28/2022 1141   GFRNONAA >60 05/11/2017 1844   GFRAA >60 05/11/2017 1844    Lab Results  Component Value Date   TSH 3.71 12/23/2015      ASSESSMENT AND PLAN 33 y.o. year old female  has a past medical history of Anemia, Anxiety, MDD (major depressive disorder), Migraine with aura and without status migrainosus, not intractable, Prolapse of female pelvic organs, SUI (stress urinary incontinence, female), and Wears glasses. here with   1.  Migraine headache with aura/ Retinal migraine :  -- Continue Nurtec at the onset of migraine.  1 tablet in 24 hours --If headache frequency increases she was advised to let us  know. --Follow-up in 1 year or sooner if needed  Previous patient of Dr. Tilda Fogo.  Will transition to Dr. Teddy Fear primary neurologist  Clem Currier, MSN, NP-C 09/21/2023, 11:11 AM [facility] [address] [place], [place] [address] [phone]  The  patient's condition requires frequent monitoring and adjustments in the treatment plan, reflecting the ongoing complexity of care.  This provider is the continuing focal point for all needed services for this condition.

## 2023-09-24 ENCOUNTER — Other Ambulatory Visit (HOSPITAL_COMMUNITY): Payer: Self-pay

## 2023-10-03 ENCOUNTER — Other Ambulatory Visit (HOSPITAL_COMMUNITY): Payer: Self-pay

## 2023-10-31 ENCOUNTER — Other Ambulatory Visit (HOSPITAL_COMMUNITY): Payer: Self-pay

## 2023-11-22 ENCOUNTER — Other Ambulatory Visit: Payer: Self-pay | Admitting: Medical Genetics

## 2023-11-29 ENCOUNTER — Ambulatory Visit: Admitting: Family Medicine

## 2023-12-07 ENCOUNTER — Ambulatory Visit: Admitting: Family Medicine

## 2023-12-08 ENCOUNTER — Ambulatory Visit (INDEPENDENT_AMBULATORY_CARE_PROVIDER_SITE_OTHER): Admitting: Family Medicine

## 2023-12-08 ENCOUNTER — Encounter: Payer: Self-pay | Admitting: Family Medicine

## 2023-12-08 VITALS — BP 119/80 | HR 83 | Resp 16 | Ht <= 58 in | Wt 139.0 lb

## 2023-12-08 DIAGNOSIS — G43B0 Ophthalmoplegic migraine, not intractable: Secondary | ICD-10-CM | POA: Diagnosis not present

## 2023-12-08 DIAGNOSIS — E538 Deficiency of other specified B group vitamins: Secondary | ICD-10-CM | POA: Diagnosis not present

## 2023-12-08 DIAGNOSIS — F32A Depression, unspecified: Secondary | ICD-10-CM | POA: Diagnosis not present

## 2023-12-08 DIAGNOSIS — Z7689 Persons encountering health services in other specified circumstances: Secondary | ICD-10-CM | POA: Diagnosis not present

## 2023-12-08 DIAGNOSIS — F419 Anxiety disorder, unspecified: Secondary | ICD-10-CM

## 2023-12-08 DIAGNOSIS — E559 Vitamin D deficiency, unspecified: Secondary | ICD-10-CM | POA: Diagnosis not present

## 2023-12-08 DIAGNOSIS — Z0000 Encounter for general adult medical examination without abnormal findings: Secondary | ICD-10-CM | POA: Diagnosis not present

## 2023-12-08 NOTE — Patient Instructions (Signed)

## 2023-12-08 NOTE — Progress Notes (Signed)
 New Patient Office Visit  Subjective   Patient ID: Jordan [name], female    DOB: [birth year]  Age: 33 y.o. MRN: [id number]  CC:  Chief Complaint  Patient presents with   Establish Care   Discussed the use of AI scribe software for clinical note transcription with the patient, who gave verbal consent to proceed.  History of Present Illness    Jordan [name] is a 33 year old female who presents to establish care with [facility]. Her previous PCP was Mliss Spray, FNP and saw her in Oct 2024.   She has a history of anxiety and depression. Previously, she was on Lexapro , which alleviated her anxiety but caused emotional blunting, affecting her ability to engage with her children. She is not currently on any medication for mood. Recently, she has been feeling down and irritable, likely due to the death of her cat.  She experiences migraines with visual aura, specifically losing vision in her left eye during episodes. She is under the care of neurology for her migraines and takes Nurtec for management.  She has a history of anemia and low vitamin D  and vitamin B12. She was previously diligent with taking vitamins but has not been consistent recently. She inquires about checking her vitamin B levels, which were low in the past.  She underwent a hysterectomy in January and no longer has a cervix, but her ovaries were left intact. She inquires about the need for gynecological care post-hysterectomy and whether her estrogen levels should be monitored.          12/08/2023    9:03 AM 01/07/2023    9:46 AM 12/10/2022   10:20 AM 05/28/2022   11:20 AM  GAD 7 : Generalized Anxiety Score  Nervous, Anxious, on Edge 1 0 2 0  Control/stop worrying 1 0 2 0  Worry too much - different things 0 0 3 0  Trouble relaxing 0 0 3 0  Restless 0 0 0 0  Easily annoyed or irritable 2 0 3 0  Afraid - awful might happen 0 0 1 0  Total GAD 7 Score 4 0 14 0  Anxiety Difficulty Not  difficult at all Not difficult at all  Not difficult at all       12/08/2023    9:02 AM 01/07/2023    9:46 AM 12/10/2022   10:11 AM  PHQ9 SCORE ONLY  PHQ-9 Total Score 9 4  16       Data saved with a previous flowsheet row definition     Outpatient Encounter Medications as of 12/08/2023  Medication Sig   acetaminophen  (TYLENOL ) 500 MG tablet Take 1 tablet (500 mg total) by mouth every 6 (six) hours as needed (pain).   ibuprofen  (ADVIL ) 600 MG tablet Take 1 tablet (600 mg total) by mouth every 6 (six) hours as needed.   Rimegepant Sulfate  (NURTEC) 75 MG TBDP Take 1 tablet (75 mg total) by mouth daily as needed at the onset of a migraine. Only 1 tablet in 24 hours.   No facility-administered encounter medications on file as of 12/08/2023.    Patient Active Problem List   Diagnosis Date Noted   Uterovaginal prolapse, incomplete 04/19/2023   Mild episode of recurrent major depressive disorder (HCC) 12/10/2022   Anxiety 12/10/2022   Bladder prolapse, female, acquired 11/26/2021   Ophthalmoplegic migraine, not intractable 10/08/2016   B12 deficiency anemia 11/12/2011   Vitamin D  deficiency 11/12/2011   Past Medical History:  Diagnosis Date   Anemia    Anxiety    MDD (major depressive disorder)    Migraine with aura and without status migrainosus, not intractable    followed by [facility]--- duwaine pelton NP   Prolapse of female pelvic organs    anterior vaginal wall/ incomplete uterovaginal wall   SUI (stress urinary incontinence, female)    Wears glasses    Past Surgical History:  Procedure Laterality Date   ABDOMINAL HYSTERECTOMY  04/2023   CYSTOSCOPY N/A 04/19/2023   Procedure: CYSTOSCOPY;  Surgeon: Marilynne Rosaline SAILOR, MD;  Location: [facility];  Service: Gynecology;  Laterality: N/A;   NO PAST SURGERIES     PERINEOPLASTY N/A 04/19/2023   Procedure: PERINEOPLASTY;  Surgeon: Marilynne Rosaline SAILOR, MD;  Location: [facility];  Service:  Gynecology;  Laterality: N/A;   ROBOTIC ASSISTED LAPAROSCOPIC OVARIAN CYSTECTOMY Left 04/19/2023   Procedure: XI ROBOTIC ASSISTED LAPAROSCOPIC OVARIAN CYSTECTOMY;  Surgeon: Marilynne Rosaline SAILOR, MD;  Location: [facility];  Service: Gynecology;  Laterality: Left;   WISDOM TOOTH EXTRACTION  08/04/2010   XI ROBOTIC ASSISTED TOTAL HYSTERECTOMY WITH SACROCOLPOPEXY N/A 04/19/2023   Procedure: XI ROBOTIC ASSISTED TOTAL HYSTERECTOMY WITH BILATERAL SALPINGECTOMY, SACROCOLPOPEXY AND BLADDER REPAIR;  Surgeon: Marilynne Rosaline SAILOR, MD;  Location: [facility];  Service: Gynecology;  Laterality: N/A;   Family History  Problem Relation Age of Onset   Hypertension Father    Hyperlipidemia Father    Cancer Maternal Grandmother    Cancer Maternal Grandfather    Miscarriages / Stillbirths Sister    Cancer Paternal Grandmother    Social History   Socioeconomic History   Marital status: Married    Spouse name: Camellia   Number of children: 2   Years of education: BSN   Highest education level: Bachelor's degree (e.g., BA, AB, BS)  Occupational History   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  Tobacco Use   Smoking status: Never    Passive exposure: Never   Smokeless tobacco: Never  Vaping Use   Vaping status: Never Used  Substance and Sexual Activity   Alcohol use: Not Currently   Drug use: No   Sexual activity: Yes    Birth control/protection: Other-see comments    Comment: Husband had vesectomy  Other Topics Concern   Not on file  Social History Narrative   Nurse at [employer]   Married with 2 children   Caffeine use: 3-4 times per week Toni)   Right handed   Social Drivers of Health   Financial Resource Strain: Low Risk  (12/05/2023)   Overall Financial Resource Strain (CARDIA)    Difficulty of Paying Living Expenses: Not hard at all  Food Insecurity: No Food Insecurity (12/05/2023)   Hunger Vital Sign    Worried About Running Out of Food in the Last Year: Never  true    Ran Out of Food in the Last Year: Never true  Transportation Needs: No Transportation Needs (12/05/2023)   PRAPARE - Administrator, Civil Service (Medical): No    Lack of Transportation (Non-Medical): No  Physical Activity: Insufficiently Active (12/05/2023)   Exercise Vital Sign    Days of Exercise per Week: 3 days    Minutes of Exercise per Session: 20 min  Stress: Stress Concern Present (12/05/2023)   [employer] of Occupational Health - Occupational Stress Questionnaire    Feeling of Stress: To some extent  Social Connections: Socially Isolated (12/05/2023)   Social Connection and Isolation  Panel    Frequency of Communication with Friends and Family: Never    Frequency of Social Gatherings with Friends and Family: Never    Attends Religious Services: Never    Database administrator or Organizations: No    Attends Engineer, structural: Not on file    Marital Status: Married  Catering manager Violence: Not At Risk (12/10/2022)   Humiliation, Afraid, Rape, and Kick questionnaire    Fear of Current or Ex-Partner: No    Emotionally Abused: No    Physically Abused: No    Sexually Abused: No   Outpatient Medications Prior to Visit  Medication Sig Dispense Refill   acetaminophen  (TYLENOL ) 500 MG tablet Take 1 tablet (500 mg total) by mouth every 6 (six) hours as needed (pain). 30 tablet 0   ibuprofen  (ADVIL ) 600 MG tablet Take 1 tablet (600 mg total) by mouth every 6 (six) hours as needed. 30 tablet 0   Rimegepant Sulfate  (NURTEC) 75 MG TBDP Take 1 tablet (75 mg total) by mouth daily as needed at the onset of a migraine. Only 1 tablet in 24 hours. 15 tablet 11   No facility-administered medications prior to visit.   Allergies  Allergen Reactions   Latex Swelling and Rash   ROS: see HPI     Objective   Today's Vitals   12/08/23 0838  BP: 119/80  Pulse: 83  Resp: 16  SpO2: 99%  Weight: 139 lb (63 kg)  Height: 4' 10 (1.473 m)  PainSc: 0-No pain    GENERAL: Well-appearing, in NAD. Well nourished.  SKIN: Pink, warm and dry. No rash, lesion, ulceration, or ecchymoses.  Head: Normocephalic. NECK: Trachea midline. Full ROM w/o pain or tenderness. No lymphadenopathy.  EARS: Tympanic membranes are intact, translucent without bulging and without drainage. Appropriate landmarks visualized.  EYES: Conjunctiva clear without exudates. EOMI, PERRL, no drainage present.  NOSE: Septum midline w/o deformity. Nares patent, mucosa pink and non-inflamed w/o drainage. No sinus tenderness.  THROAT: Uvula midline. Oropharynx clear. Tonsils non-inflamed without exudate. Mucous membranes pink and moist.  RESPIRATORY: Chest wall symmetrical. Respirations even and non-labored. Breath sounds clear to auscultation bilaterally.  CARDIAC: S1, S2 present, regular rate and rhythm without murmur or gallops. Peripheral pulses 2+ bilaterally.  MSK: Muscle tone and strength appropriate for age. Joints w/o tenderness, redness, or swelling.  EXTREMITIES: Without clubbing, cyanosis, or edema.  NEUROLOGIC: No motor or sensory deficits. Steady, even gait. C2-C12 intact.  PSYCH/MENTAL STATUS: Alert, oriented x 3. Cooperative, appropriate mood and affect.     Assessment & Plan:   1. Encounter to establish care (Primary) Patient is a 32- year-old female who presents today to establish care with primary care at [facility]. Reviewed the past medical history, family history, social history, surgical history, medications and allergies today- updates made as indicated. Patient has no concerns today.  2. Anxiety and depression Previously on Lexapro , which improved anxiety but caused emotional blunting. Currently not on mood medication. Recent mood changes due to the death of her cat, with increased irritability and low mood. Consider restarting SSRI if mood symptoms worsen.  3. Vitamin D  deficiency History of vitamin D  deficiency. Currently not taking vitamins regularly. Encourage  resumption of vitamin supplementation. Will check vitamin D  level today.  - VITAMIN D  25 Hydroxy (Vit-D Deficiency, Fractures)  4. Vitamin B12 deficiency History of vitamin B12 deficiency. Currently not taking vitamins regularly. Will check vitamin B12 level today.  - Vitamin B12  5. Healthcare maintenance Post-hysterectomy, no cervical  cancer screening needed. Potential estrogen level testing if menopausal symptoms arise. Fasting labs may be slightly affected by Dr. Pepper Zero consumption. Schedule full physical in about 4 weeks- discussed reviewing lab work during physical. - CBC with Differential/Platelet - Comprehensive metabolic panel with GFR - Hemoglobin A1c - Lipid panel - TSH Rfx on Abnormal to Free T4  6. Ophthalmoplegic migraine, not intractable Migraines well-controlled with current treatment of Nurtec PRN at onset of migraine. Visual aura involves loss of vision in the left eye during migraines. Continue yearly neurology follow-up, last visit in June 2025.   Return in about 4 weeks (around 01/05/2024) for Physical.   Evalene Arts, FNP

## 2023-12-09 ENCOUNTER — Other Ambulatory Visit (HOSPITAL_COMMUNITY): Payer: Self-pay

## 2023-12-09 ENCOUNTER — Ambulatory Visit: Payer: Self-pay | Admitting: Family Medicine

## 2023-12-09 DIAGNOSIS — E559 Vitamin D deficiency, unspecified: Secondary | ICD-10-CM

## 2023-12-09 LAB — CBC WITH DIFFERENTIAL/PLATELET
Basophils Absolute: 0 x10E3/uL (ref 0.0–0.2)
Basos: 0 %
EOS (ABSOLUTE): 0.1 x10E3/uL (ref 0.0–0.4)
Eos: 1 %
Hematocrit: 43.1 % (ref 34.0–46.6)
Hemoglobin: 13.9 g/dL (ref 11.1–15.9)
Immature Grans (Abs): 0 x10E3/uL (ref 0.0–0.1)
Immature Granulocytes: 0 %
Lymphocytes Absolute: 1.7 x10E3/uL (ref 0.7–3.1)
Lymphs: 35 %
MCH: 31.7 pg (ref 26.6–33.0)
MCHC: 32.3 g/dL (ref 31.5–35.7)
MCV: 98 fL — ABNORMAL HIGH (ref 79–97)
Monocytes Absolute: 0.3 x10E3/uL (ref 0.1–0.9)
Monocytes: 5 %
Neutrophils Absolute: 2.8 x10E3/uL (ref 1.4–7.0)
Neutrophils: 59 %
Platelets: 245 x10E3/uL (ref 150–450)
RBC: 4.39 x10E6/uL (ref 3.77–5.28)
RDW: 12.3 % (ref 11.7–15.4)
WBC: 4.8 x10E3/uL (ref 3.4–10.8)

## 2023-12-09 LAB — COMPREHENSIVE METABOLIC PANEL WITH GFR
ALT: 8 IU/L (ref 0–32)
AST: 13 IU/L (ref 0–40)
Albumin: 4.5 g/dL (ref 3.9–4.9)
Alkaline Phosphatase: 55 IU/L (ref 44–121)
BUN/Creatinine Ratio: 15 (ref 9–23)
BUN: 11 mg/dL (ref 6–20)
Bilirubin Total: 0.4 mg/dL (ref 0.0–1.2)
CO2: 23 mmol/L (ref 20–29)
Calcium: 9.3 mg/dL (ref 8.7–10.2)
Chloride: 103 mmol/L (ref 96–106)
Creatinine, Ser: 0.73 mg/dL (ref 0.57–1.00)
Globulin, Total: 1.8 g/dL (ref 1.5–4.5)
Glucose: 84 mg/dL (ref 70–99)
Potassium: 4.7 mmol/L (ref 3.5–5.2)
Sodium: 137 mmol/L (ref 134–144)
Total Protein: 6.3 g/dL (ref 6.0–8.5)
eGFR: 113 mL/min/1.73 (ref >59)

## 2023-12-09 LAB — VITAMIN D 25 HYDROXY (VIT D DEFICIENCY, FRACTURES): Vit D, 25-Hydroxy: 15.4 ng/mL — ABNORMAL LOW (ref 30.0–100.0)

## 2023-12-09 LAB — LIPID PANEL
Chol/HDL Ratio: 3 ratio (ref 0.0–4.4)
Cholesterol, Total: 175 mg/dL (ref 100–199)
HDL: 58 mg/dL (ref >39)
LDL Chol Calc (NIH): 106 mg/dL — ABNORMAL HIGH (ref 0–99)
Triglycerides: 56 mg/dL (ref 0–149)
VLDL Cholesterol Cal: 11 mg/dL (ref 5–40)

## 2023-12-09 LAB — HEMOGLOBIN A1C
Est. average glucose Bld gHb Est-mCnc: 103 mg/dL
Hgb A1c MFr Bld: 5.2 % (ref 4.8–5.6)

## 2023-12-09 LAB — VITAMIN B12: Vitamin B-12: 149 pg/mL — ABNORMAL LOW (ref 232–1245)

## 2023-12-09 LAB — TSH RFX ON ABNORMAL TO FREE T4: TSH: 1.66 u[IU]/mL (ref 0.450–4.500)

## 2023-12-09 MED ORDER — VITAMIN D (ERGOCALCIFEROL) 1.25 MG (50000 UNIT) PO CAPS
50000.0000 [IU] | ORAL_CAPSULE | ORAL | 0 refills | Status: AC
  Filled 2023-12-09: qty 5, 35d supply, fill #0

## 2023-12-10 ENCOUNTER — Other Ambulatory Visit: Payer: Self-pay

## 2023-12-11 ENCOUNTER — Other Ambulatory Visit (HOSPITAL_COMMUNITY): Payer: Self-pay

## 2023-12-14 ENCOUNTER — Other Ambulatory Visit
Admission: RE | Admit: 2023-12-14 | Discharge: 2023-12-14 | Disposition: A | Discharge status: Home / Self Care | Payer: Self-pay | Source: Ambulatory Visit | Attending: Medical Genetics | Admitting: Medical Genetics

## 2023-12-25 LAB — GENECONNECT MOLECULAR SCREEN: Genetic Analysis Overall Interpretation: NEGATIVE

## 2024-01-09 ENCOUNTER — Encounter: Payer: Self-pay | Admitting: Family Medicine

## 2024-01-09 ENCOUNTER — Other Ambulatory Visit (HOSPITAL_COMMUNITY): Payer: Self-pay

## 2024-01-09 ENCOUNTER — Ambulatory Visit (INDEPENDENT_AMBULATORY_CARE_PROVIDER_SITE_OTHER): Admitting: Family Medicine

## 2024-01-09 VITALS — BP 107/73 | HR 82 | Resp 16 | Ht <= 58 in | Wt 138.2 lb

## 2024-01-09 DIAGNOSIS — F419 Anxiety disorder, unspecified: Secondary | ICD-10-CM | POA: Diagnosis not present

## 2024-01-09 DIAGNOSIS — D518 Other vitamin B12 deficiency anemias: Secondary | ICD-10-CM | POA: Diagnosis not present

## 2024-01-09 DIAGNOSIS — Z0000 Encounter for general adult medical examination without abnormal findings: Secondary | ICD-10-CM | POA: Diagnosis not present

## 2024-01-09 DIAGNOSIS — F330 Major depressive disorder, recurrent, mild: Secondary | ICD-10-CM

## 2024-01-09 MED ORDER — FLUOXETINE HCL 10 MG PO CAPS
10.0000 mg | ORAL_CAPSULE | Freq: Every day | ORAL | 2 refills | Status: AC
  Filled 2024-01-09: qty 30, 30d supply, fill #0

## 2024-01-09 MED ORDER — CYANOCOBALAMIN 1000 MCG/ML IJ SOLN
1000.0000 ug | Freq: Once | INTRAMUSCULAR | Status: AC
  Administered 2024-01-09: 1000 ug via INTRAMUSCULAR

## 2024-01-09 NOTE — Patient Instructions (Signed)
 Health Maintenance Recommendations Screening Testing Mammogram Every 1 -2 years based on history and risk factors Starting at age 32 Please call to schedule: [facility]  [address], [place], [place] [address] Phone: [phone] [facility] [address], [place], [place] [address] Phone: [phone] Pap Smear Ages 21-39 every 3 years Ages 32-65 every 5 years with HPV testing More frequent testing may be required based on results and history Colon Cancer Screening Every 1-10 years based on test performed, risk factors, and history Starting at age 47 Bone Density Screening Every 2-10 years based on history Starting at age 34 for women Recommendations for men differ based on medication usage, history, and risk factors AAA Screening One time ultrasound Men 44-26 years old who have every smoked Lung Cancer Screening Low Dose Lung CT every 12 months Age 14-80 years with a 30 pack-year smoking history who still smoke or who have quit within the last 15 years   Screening Labs Routine  Labs: Complete Blood Count (CBC), Complete Metabolic Panel (CMP), Cholesterol (Lipid Panel) Every 6-12 months based on history and medications May be recommended more frequently based on current conditions or previous results Hemoglobin A1c Lab Every 3-12 months based on history and previous results Starting at age 82 or earlier with diagnosis of diabetes, high cholesterol, BMI >26, and/or risk factors Frequent monitoring for patients with diabetes to ensure blood sugar control Thyroid  Panel (TSH w/ T3 & T4) Every 6 months based on history, symptoms, and risk factors May be repeated more often if on medication HIV One time testing for all patients 76 and older May be repeated more frequently for patients with increased risk factors or exposure Hepatitis C One time testing for all patients 13 and older May be repeated more frequently for patients with increased risk factors or  exposure Gonorrhea, Chlamydia Every 12 months for all sexually active persons 13-24 years Additional monitoring may be recommended for those who are considered high risk or who have symptoms PSA Men 100-42 years old with risk factors Additional screening may be recommended from age 3-69 based on risk factors, symptoms, and history   Vaccine Recommendations Tetanus Booster All adults every 10 years Flu Vaccine All patients 6 months and older every year COVID Vaccine All patients 12 years and older Initial dosing with booster May recommend additional booster based on age and health history HPV Vaccine 2 doses all patients age 53-26 Dosing may be considered for patients over 26 Shingles Vaccine (Shingrix) 2 doses all adults 55 years and older Pneumonia (Pneumovax 87) All adults 65 years and older May recommend earlier dosing based on health history Pneumonia (Prevnar 47) All adults 65 years and older Dosed 1 year after Pneumovax 23   Additional Screening, Testing, and Vaccinations may be recommended on an individualized basis based on family history, health history, risk factors, and/or exposure.  __________________________________________________________   Diet Recommendations for All Patients   I recommend that all patients maintain a diet low in saturated fats, carbohydrates, and cholesterol. While this can be challenging at first, it is not impossible and small changes can make big differences.  Things to try: Decreasing the amount of soda, sweet tea, and/or juice to one or less per day and replace with water  While water  is always the first choice, if you do not like water  you may consider adding a water  additive without sugar to improve the taste other sugar free drinks Replace potatoes with a brightly colored vegetable at dinner  Use healthy oils, such as canola oil or olive oil, instead of butter or hard margarine Limit your bread intake to two pieces or less a  day Replace regular pasta with low carb pasta options Bake, broil, or grill foods instead of frying Monitor portion sizes  Eat smaller, more frequent meals throughout the day instead of large meals   An important thing to remember is, if you love foods that are not great for your health, you don't have to give them up completely. Instead, allow these foods to be a reward when you have done well. Allowing yourself to still have special treats every once in a while is a nice way to tell yourself thank you for working hard to keep yourself healthy.    Also remember that every day is a new day. If you have a bad day and fall off the wagon, you can still climb right back up and keep moving along on your journey!   We have resources available to help you!  Some websites that may be helpful include: www.http://www.wall-moore.info/        Www.VeryWellFit.com _____________________________________________________________   Activity Recommendations for All Patients   I recommend that all adults get at least 20 minutes of moderate physical activity that elevates your heart rate at least 5 days out of the week.  Some examples include: Walking or jogging at a pace that allows you to carry on a conversation Cycling (stationary bike or outdoors) Water  aerobics Yoga Weight lifting Dancing If physical limitations prevent you from putting stress on your joints, exercise in a pool or seated in a chair are excellent options.   Do determine your MAXIMUM heart rate for activity: YOUR AGE - 220 = MAX HeartRate    Remember! Do not push yourself too hard.  Start slowly and build up your pace, speed, weight, time in exercise, etc.  Allow your body to rest between exercise and get good sleep. You will need more water  than normal when you are exerting yourself. Do not wait until you are thirsty to drink. Drink with a purpose of getting in at least 8, 8 ounce glasses of water  a day plus more depending on how much you exercise  and sweat.      If you begin to develop dizziness, chest pain, abdominal pain, jaw pain, shortness of breath, headache, vision changes, lightheadedness, or other concerning symptoms, stop the activity and allow your body to rest. If your symptoms are severe, seek emergency evaluation immediately. If your symptoms are concerning, but not severe, please let us  know so that we can recommend further evaluation.

## 2024-01-09 NOTE — Addendum Note (Signed)
 Addended by: QUINTIN PIERCE A on: 01/09/2024 09:41 AM   Modules accepted: Orders

## 2024-01-09 NOTE — Progress Notes (Signed)
 Subjective:   Jordan [name] [birth year]  01/09/2024   CC: Chief Complaint  Patient presents with   Annual Exam    HPI: Jordan [name] is a 33 y.o. female who presents for a routine health maintenance exam.  Fasting labs collected at previous visit.   HEALTH SCREENINGS: - Vision Screening: up to date - Dental Visits: up to date - Pap smear: not applicable, total hysterectomy 04/19/2023 - Breast Exam: up to date - STD Screening: Declined - Mammogram (40+): Not applicable  - Colonoscopy (45+): Not applicable  - Bone Density (65+ or under 65 with predisposing conditions): Not applicable  - Lung CA screening with low-dose CT:  Not applicable Adults age 19-80 who are current cigarette smokers or quit within the last 15 years. Must have 20 pack year history.   Depression and Anxiety Screen done today and results listed below:     01/09/2024    9:00 AM 12/08/2023    9:02 AM 01/07/2023    9:46 AM 12/10/2022   10:11 AM 12/10/2022    9:52 AM  Depression screen PHQ 2/9  Decreased Interest 2 1 0 3 0  Down, Depressed, Hopeless 2 1 0 2 0  PHQ - 2 Score 4 2 0 5 0  Altered sleeping 1 1 1 2    Tired, decreased energy 2 2 1 3    Change in appetite 1 0 0 0   Feeling bad or failure about yourself  3 2 1 3    Trouble concentrating 1 1 1 1    Moving slowly or fidgety/restless 1 0 0 0   Suicidal thoughts 1 1 0 2   PHQ-9 Score 14 9 4 16    Difficult doing work/chores Somewhat difficult Not difficult at all  Somewhat difficult       01/09/2024    9:01 AM 12/08/2023    9:03 AM 01/07/2023    9:46 AM 12/10/2022   10:20 AM  GAD 7 : Generalized Anxiety Score  Nervous, Anxious, on Edge 3 1 0 2  Control/stop worrying 2 1 0 2  Worry too much - different things 2 0 0 3  Trouble relaxing 2 0 0 3  Restless 1 0 0 0  Easily annoyed or irritable 2 2 0 3  Afraid - awful might happen 0 0 0 1  Total GAD 7 Score 12 4 0 14  Anxiety Difficulty Somewhat difficult Not difficult at all Not difficult at all     IMMUNIZATIONS: - Tdap: Tetanus vaccination status reviewed: last tetanus booster within 10 years. - HPV: Up to date - Influenza: Up to date - Pneumovax: Not applicable - Prevnar 20: Not applicable - Zostavax (50+): Not applicable  Past medical history, surgical history, medications, allergies, family history and social history reviewed with patient today and changes made to appropriate areas of the chart.   Past Medical History:  Diagnosis Date   Anemia    Anxiety    MDD (major depressive disorder)    Migraine with aura and without status migrainosus, not intractable    followed by [facility]--- duwaine pelton NP   Prolapse of female pelvic organs    anterior vaginal wall/ incomplete uterovaginal wall   SUI (stress urinary incontinence, female)    Wears glasses     Past Surgical History:  Procedure Laterality Date   ABDOMINAL HYSTERECTOMY  04/2023   CYSTOSCOPY N/A 04/19/2023   Procedure: CYSTOSCOPY;  Surgeon: Marilynne Rosaline SAILOR, MD;  Location: [facility];  Service: Gynecology;  Laterality: N/A;   NO PAST SURGERIES     PERINEOPLASTY N/A 04/19/2023   Procedure: PERINEOPLASTY;  Surgeon: Marilynne Rosaline SAILOR, MD;  Location: [facility];  Service: Gynecology;  Laterality: N/A;   ROBOTIC ASSISTED LAPAROSCOPIC OVARIAN CYSTECTOMY Left 04/19/2023   Procedure: XI ROBOTIC ASSISTED LAPAROSCOPIC OVARIAN CYSTECTOMY;  Surgeon: Marilynne Rosaline SAILOR, MD;  Location: [facility];  Service: Gynecology;  Laterality: Left;   WISDOM TOOTH EXTRACTION  08/04/2010   XI ROBOTIC ASSISTED TOTAL HYSTERECTOMY WITH SACROCOLPOPEXY N/A 04/19/2023   Procedure: XI ROBOTIC ASSISTED TOTAL HYSTERECTOMY WITH BILATERAL SALPINGECTOMY, SACROCOLPOPEXY AND BLADDER REPAIR;  Surgeon: Marilynne Rosaline SAILOR, MD;  Location: [facility];  Service: Gynecology;  Laterality: N/A;    Current Outpatient Medications on File Prior to Visit  Medication Sig    acetaminophen  (TYLENOL ) 500 MG tablet Take 1 tablet (500 mg total) by mouth every 6 (six) hours as needed (pain).   ibuprofen  (ADVIL ) 600 MG tablet Take 1 tablet (600 mg total) by mouth every 6 (six) hours as needed.   Rimegepant Sulfate  (NURTEC) 75 MG TBDP Take 1 tablet (75 mg total) by mouth daily as needed at the onset of a migraine. Only 1 tablet in 24 hours.   Vitamin D , Ergocalciferol , (DRISDOL ) 1.25 MG (50000 UNIT) CAPS capsule Take 1 capsule (50,000 Units total) by mouth every 7 (seven) days. (Patient not taking: Reported on 01/09/2024)   No current facility-administered medications on file prior to visit.   Allergies  Allergen Reactions   Latex Swelling and Rash   Social History   Socioeconomic History   Marital status: Married    Spouse name: Camellia   Number of children: 2   Years of education: BSN   Highest education level: Bachelor's degree (e.g., BA, AB, BS)  Occupational History   Occupation: [employer] nurse  Tobacco Use   Smoking status: Never    Passive exposure: Never   Smokeless tobacco: Never  Vaping Use   Vaping status: Never Used  Substance and Sexual Activity   Alcohol use: Not Currently   Drug use: No   Sexual activity: Yes    Birth control/protection: Other-see comments    Comment: Husband had vesectomy  Other Topics Concern   Not on file  Social History Narrative   Nurse at [employer]   Married with 2 children   Caffeine use: 3-4 times per week Toni)   Right handed   Social Drivers of Health   Financial Resource Strain: Low Risk  (12/05/2023)   Overall Financial Resource Strain (CARDIA)    Difficulty of Paying Living Expenses: Not hard at all  Food Insecurity: No Food Insecurity (12/05/2023)   Hunger Vital Sign    Worried About Running Out of Food in the Last Year: Never true    Ran Out of Food in the Last Year: Never true  Transportation Needs: No Transportation Needs (12/05/2023)   PRAPARE - Administrator, Civil Service (Medical):  No    Lack of Transportation (Non-Medical): No  Physical Activity: Insufficiently Active (12/05/2023)   Exercise Vital Sign    Days of Exercise per Week: 3 days    Minutes of Exercise per Session: 20 min  Stress: Stress Concern Present (12/05/2023)   [employer] of Occupational Health - Occupational Stress Questionnaire    Feeling of Stress: To some extent  Social Connections: Socially Isolated (12/05/2023)   Social Connection and Isolation Panel    Frequency of Communication with Friends and Family:  Never    Frequency of Social Gatherings with Friends and Family: Never    Attends Religious Services: Never    Database administrator or Organizations: No    Attends Engineer, structural: Not on file    Marital Status: Married  Catering manager Violence: Not At Risk (12/10/2022)   Humiliation, Afraid, Rape, and Kick questionnaire    Fear of Current or Ex-Partner: No    Emotionally Abused: No    Physically Abused: No    Sexually Abused: No   Social History   Tobacco Use  Smoking Status Never   Passive exposure: Never  Smokeless Tobacco Never   Social History   Substance and Sexual Activity  Alcohol Use Not Currently    Family History  Problem Relation Age of Onset   Hypertension Father    Hyperlipidemia Father    Cancer Maternal Grandmother    Cancer Maternal Grandfather    Miscarriages / Stillbirths Sister    Cancer Paternal Grandmother    ROS: Denies fever, fatigue, unexplained weight loss/gain, chest pain, SHOB, and palpitations. Denies neurological deficits, gastrointestinal or genitourinary complaints, and skin changes.   Objective:   Today's Vitals   01/09/24 0857  BP: 107/73  Pulse: 82  Resp: 16  SpO2: 99%  Weight: 138 lb 3.2 oz (62.7 kg)  Height: 4' 10 (1.473 m)  PainSc: 0-No pain    GENERAL APPEARANCE: Well-appearing, in NAD. Well nourished.  SKIN: Pink, warm and dry. Turgor normal. No rash, lesion, ulceration, or ecchymoses. Hair evenly  distributed.  HEENT: HEAD: Normocephalic.  EYES: PERRLA. EOMI. Lids intact w/o defect. Sclera white, Conjunctiva pink w/o exudate.  EARS: External ear w/o redness, swelling, masses or lesions. EAC clear. TM's intact, translucent w/o bulging, appropriate landmarks visualized. Appropriate acuity to conversational tones.  NOSE: Septum midline w/o deformity. Nares patent, mucosa pink and non-inflamed w/o drainage. No sinus tenderness.  THROAT: Uvula midline. Oropharynx clear. Tonsils non-inflamed w/o exudate. Oral mucosa pink and moist.  NECK: Supple, Trachea midline. Full ROM w/o pain or tenderness. No lymphadenopathy. Thyroid  non-tender w/o enlargement or palpable masses.  BREASTS: Deferred.  RESPIRATORY: Chest wall symmetrical w/o masses. Respirations even and non-labored. Breath sounds clear to auscultation bilaterally. No wheezes, rales, rhonchi, or crackles. CARDIAC: S1, S2 present, regular rate and rhythm. No gallops, murmurs, rubs, or clicks. PMI w/o lifts, heaves, or thrills. No carotid bruits. Capillary refill <2 seconds. Peripheral pulses 2+ bilaterally. GI: Abdomen soft w/o distention. Normoactive bowel sounds. No palpable masses or tenderness. No guarding or rebound tenderness. Liver and spleen w/o tenderness or enlargement. No CVA tenderness.  GU: Deferred.  MSK: Muscle tone and strength appropriate for age, w/o atrophy or abnormal movement.  EXTREMITIES: Active ROM intact, w/o tenderness, crepitus, or contracture. No obvious joint deformities or effusions. No clubbing, edema, or cyanosis.  NEUROLOGIC: CN's II-XII intact. Motor strength symmetrical with no obvious weakness. No sensory deficits. DTR's 2+ symmetric bilaterally. Steady, even gait.  PSYCH/MENTAL STATUS: Alert, oriented x 3. Cooperative, appropriate mood and affect.    Results for orders placed or performed during the hospital encounter of 12/14/23  [facility] Molecular Screen - Blood ([facility])    Collection Time: 12/14/23  8:21 AM  Result Value Ref Range   Genetic Analysis Overall Interpretation Negative    Genetic Disease Assessed      This is a screening test and does not detect all pathogenic or likely pathogenic variant(s) in the tested genes; diagnostic testing is recommended for individuals with  a personal or family history of heart disease or hereditary cancer. [employer] [facility]  Population Screen is a screening test that analyzes 11 genes related to hereditary breast and ovarian cancer (HBOC) syndrome, Lynch syndrome, and familial hypercholesterolemia. This test only reports clinically significant pathogenic and likely  pathogenic variants but does not report variants of uncertain significance (VUS). In addition, analysis of the PMS2 gene excludes exons 11-15, which overlap with a known pseudogene (PMS2CL).    Genetic Analysis Report      No pathogenic or likely pathogenic variants were detected in the genes analyzed by this test.Genetic test results should be interpreted in the context of an individual's personal medical and family history. Alteration to medical management is NOT  recommended based solely on this result. Clinical correlation is advised.Additional Considerations- This is a screening test; individuals may still carry pathogenic or likely pathogenic variant(s) in the tested genes that are not detected by this test.-  For individuals at risk for these or other related conditions based on factors including personal or family history, diagnostic testing is recommended.- The absence of pathogenic or likely pathogenic variant(s) in the analyzed genes, while reassuring,  does not eliminate the possibility of a hereditary condition; there are other variants and genes associated with heart disease and hereditary cancer that are not included in this test.    Genes Tested See Notes    Disclaimer See Notes    Sequencing Location See Notes    Interpretation Methods and Limitations  See Notes     Assessment & Plan:   1. Wellness examination (Primary) - Encouraged a healthy well-balanced diet. Patient may adjust caloric intake to maintain or achieve ideal body weight. May reduce intake of dietary saturated fat and total fat and have adequate dietary potassium and calcium preferably from fresh fruits, vegetables, and low-fat dairy products.   - Advised to avoid cigarette smoking. - Discussed with the patient that most people either abstain from alcohol or drink within safe limits (<=14/week and <=4 drinks/occasion for males, <=7/weeks and <= 3 drinks/occasion for females) and that the risk for alcohol disorders and other health effects rises proportionally with the number of drinks per week and how often a drinker exceeds daily limits. - Discussed cessation/primary prevention of drug use and availability of treatment for abuse.  - Discussed sexually transmitted diseases, avoidance of unintended pregnancy and contraceptive alternatives. - Stressed the importance of regular exercise - Injury prevention: Discussed safety belts, safety helmets, smoke detector, smoking near bedding or upholstery.  - Dental health: Discussed importance of regular tooth brushing, flossing, and dental visits.  NEXT PREVENTATIVE PHYSICAL DUE IN 1 YEAR.  2. Vitamin B12 deficiency (dietary) anemia Discussed recent vitamin B12 levels and weekly injections for one month. Plan to repeat vitamin B12 level in 4 weeks.   3. Anxiety Increased depression and anxiety scores. GAD7 completed with score of 12. Denies issues with panic attacks, shortness of breath, difficulty breathing, palpitations, hyperventilation, and dizziness. Discussed benefits of cognitive behavioral therapy (CBT) and first-line pharmacotherapy concurrently. Patient reports counseling may be too expensive at this time and would also like to proceed with medication. Discussed common side effects, including GI side effects, insomnia, lethargy,  and decreased libido (usually with higher doses). Previously used Lexapro  with emotional numbness. Open to medication due to stress and overwhelming feelings. Start Prozac 10 mg daily. Discussed potential GI side effects and lower risk of emotional numbness compared to Lexapro .Plan for  4-6 week follow-up. Rx sent to pharmacy on file.  -  FLUoxetine (PROZAC) 10 MG capsule; Take 1 capsule (10 mg total) by mouth daily.  Dispense: 30 capsule; Refill: 2  4. Mild episode of recurrent major depressive disorder PHQ9 completed with score of 14. Denies active or passive suicidal ideations. Start Prozac 10 mg daily. Discussed potential GI side effects and lower risk of emotional numbness compared to Lexapro .Plan for  4-6 week follow-up. Rx sent to pharmacy on file.  - FLUoxetine (PROZAC) 10 MG capsule; Take 1 capsule (10 mg total) by mouth daily.  Dispense: 30 capsule; Refill: 2   Return in about 4 weeks (around 02/06/2024) for Mood f/u & vit B12 injection (Please schedule her RN ONLY for vit B12 injection on 11/10 & 11/17).  Patient to reach out to office if new, worrisome, or unresolved symptoms arise or if no improvement in patient's condition. Patient verbalized understanding and is agreeable to treatment plan. All questions answered to patient's satisfaction.   Evalene Arts, FNP

## 2024-01-19 DIAGNOSIS — H5213 Myopia, bilateral: Secondary | ICD-10-CM | POA: Diagnosis not present

## 2024-02-06 ENCOUNTER — Ambulatory Visit: Admitting: Family Medicine

## 2024-02-07 ENCOUNTER — Ambulatory Visit (INDEPENDENT_AMBULATORY_CARE_PROVIDER_SITE_OTHER): Admitting: Family Medicine

## 2024-02-07 ENCOUNTER — Encounter: Payer: Self-pay | Admitting: Family Medicine

## 2024-02-07 VITALS — BP 113/78 | HR 77 | Resp 16 | Ht <= 58 in | Wt 136.0 lb

## 2024-02-07 DIAGNOSIS — F419 Anxiety disorder, unspecified: Secondary | ICD-10-CM | POA: Diagnosis not present

## 2024-02-07 DIAGNOSIS — D518 Other vitamin B12 deficiency anemias: Secondary | ICD-10-CM

## 2024-02-07 DIAGNOSIS — F330 Major depressive disorder, recurrent, mild: Secondary | ICD-10-CM

## 2024-02-07 MED ORDER — CYANOCOBALAMIN 1000 MCG/ML IJ SOLN
1000.0000 ug | Freq: Once | INTRAMUSCULAR | Status: AC
  Administered 2024-02-07: 1000 ug via INTRAMUSCULAR

## 2024-02-07 NOTE — Progress Notes (Signed)
 Established Patient Office Visit  Subjective  Patient ID: Jordan [name], female    DOB: [birth year]  Age: 33 y.o. MRN: [id number]  CC: Mood Follow-Up   Discussed the use of AI scribe software for clinical note transcription with the patient, who gave verbal consent to proceed.  History of Present Illness   Jordan [name] is a 33 year old female who presents for a mood follow-up.  She does not notice any improvement in her mood since starting Prozac, despite initially feeling slightly better. Her mood fluctuates, but the general baseline remains unchanged. She experiences significant fatigue, often sleeping all day after school drop-off and going to bed early. She takes Prozac at 2 PM due to her night shift work schedule.  Her anxiety remains a significant issue, characterized by constant worry and the need to organize tasks. She feels 'really anxious' and often makes lists to manage her tasks. Recently, she has noticed increased irritability, becoming annoyed with people more easily.  She works night shifts, which complicates her sleep schedule. On days she does not work, she sleeps all night, does the school drop-off, sleeps all day, and then goes back to bed at night.  Her anxiety score was 12 at the last visit and is 8 today; her depression score was 14 and is now 12.  She has received one B12 injection so far, with two more scheduled, due to previously low B12 levels.     02/07/2024    9:13 AM 01/09/2024    9:01 AM 12/08/2023    9:03 AM 01/07/2023    9:46 AM  GAD 7 : Generalized Anxiety Score  Nervous, Anxious, on Edge 1 3 1  0  Control/stop worrying 1 2 1  0  Worry too much - different things 2 2 0 0  Trouble relaxing 1 2 0 0  Restless 0 1 0 0  Easily annoyed or irritable 2 2 2  0  Afraid - awful might happen 1 0 0 0  Total GAD 7 Score 8 12 4  0  Anxiety Difficulty Not difficult at all Somewhat difficult Not difficult at all Not difficult at all      02/07/2024    9:13 AM  01/09/2024    9:00 AM 12/08/2023    9:02 AM  PHQ9 SCORE ONLY  PHQ-9 Total Score 12 14 9    ROS: see HPI     Objective:    BP 113/78   Pulse 77   Resp 16   Ht 4' 10 (1.473 m)   Wt 136 lb (61.7 kg)   LMP 03/30/2023   SpO2 98%   BMI 28.42 kg/m  BP Readings from Last 3 Encounters:  02/07/24 113/78  01/09/24 107/73  12/08/23 119/80    Physical Exam Vitals reviewed.  Constitutional:      Appearance: Normal appearance.  Cardiovascular:     Rate and Rhythm: Normal rate and regular rhythm.     Pulses: Normal pulses.     Heart sounds: Normal heart sounds.  Pulmonary:     Effort: Pulmonary effort is normal.     Breath sounds: Normal breath sounds.  Neurological:     Mental Status: She is alert.  Psychiatric:        Mood and Affect: Mood normal.        Behavior: Behavior normal.      Assessment & Plan:   1. Anxiety (Primary) No mood improvement with Prozac. GAD & PHQ scores for anxiety and depression have slightly improved. Significant  fatigue likely due to Prozac. Anxiety more prominent than depression. Discussed Wellbutrin addition, but she prefers current regimen and psychiatric evaluation. Continue Prozac regimen at 10mg  daily. Referred to psychiatry for medication management.  - Ambulatory referral to Psychiatry  2. Mild episode of recurrent major depressive disorder See #1  3. Vitamin B12 deficiency (dietary) anemia Received one B12 injection. Low B12 may contribute to fatigue and mood symptoms. Administered B12 injection today. Continue scheduled B12 injections. Repeat B12 level after injection series.  - cyanocobalamin  (VITAMIN B12) injection 1,000 mcg   Return in about 6 weeks (around 03/20/2024) for lab ONLY visit (vitamin B12 & vitamin D  levels).    Evalene Arts, FNP

## 2024-02-13 ENCOUNTER — Ambulatory Visit

## 2024-02-13 ENCOUNTER — Ambulatory Visit (INDEPENDENT_AMBULATORY_CARE_PROVIDER_SITE_OTHER)

## 2024-02-13 ENCOUNTER — Telehealth: Payer: Self-pay | Admitting: Pharmacist

## 2024-02-13 DIAGNOSIS — D519 Vitamin B12 deficiency anemia, unspecified: Secondary | ICD-10-CM

## 2024-02-13 MED ORDER — CYANOCOBALAMIN 1000 MCG/ML IJ SOLN
1000.0000 ug | INTRAMUSCULAR | Status: AC
  Administered 2024-02-13: 1000 ug via INTRAMUSCULAR

## 2024-02-13 NOTE — Telephone Encounter (Signed)
 Pharmacy Patient Advocate Encounter   Received notification from CoverMyMeds that prior authorization for Nurtec 75MG  dispersible tablets is required/requested.   Insurance verification completed.   The patient is insured through [facility].   Per test claim: PA required; PA submitted to above mentioned insurance via Latent Key/confirmation #/EOC [id number] Status is pending

## 2024-02-13 NOTE — Progress Notes (Signed)
 After obtaining consent, and per orders of [place], injection of B12 given by Rexene CHRISTELLA Shorter. Patient instructed to remain in clinic for 20 minutes afterwards, and to report any adverse reaction to me immediately.

## 2024-02-14 NOTE — Telephone Encounter (Signed)
 Pharmacy Patient Advocate Encounter  Received notification from [facility] that Prior Authorization for NURTEC 75 MG PO TBDP has been APPROVED from 02/14/2024 to 02/13/2025   PA #/Case ID/Reference #: [id number]

## 2024-02-14 NOTE — Progress Notes (Signed)
 I provided Jordan [name], CMA verbal consent to give this patient a vitamin B12 injection.

## 2024-02-14 NOTE — Telephone Encounter (Signed)
 Received a faxed form requesting add info-faxed completed form to [phone].

## 2024-02-20 ENCOUNTER — Ambulatory Visit

## 2024-02-20 DIAGNOSIS — D519 Vitamin B12 deficiency anemia, unspecified: Secondary | ICD-10-CM

## 2024-03-19 ENCOUNTER — Other Ambulatory Visit: Payer: Self-pay

## 2024-03-19 DIAGNOSIS — E559 Vitamin D deficiency, unspecified: Secondary | ICD-10-CM

## 2024-03-19 DIAGNOSIS — D519 Vitamin B12 deficiency anemia, unspecified: Secondary | ICD-10-CM

## 2024-03-20 ENCOUNTER — Ambulatory Visit

## 2024-03-20 ENCOUNTER — Other Ambulatory Visit: Payer: Self-pay | Admitting: Family Medicine

## 2024-03-20 DIAGNOSIS — E559 Vitamin D deficiency, unspecified: Secondary | ICD-10-CM

## 2024-03-20 DIAGNOSIS — D519 Vitamin B12 deficiency anemia, unspecified: Secondary | ICD-10-CM

## 2024-03-21 LAB — VITAMIN B12: Vitamin B-12: 203 pg/mL — ABNORMAL LOW (ref 232–1245)

## 2024-03-26 ENCOUNTER — Ambulatory Visit: Payer: Self-pay | Admitting: Family Medicine

## 2024-04-16 ENCOUNTER — Encounter: Payer: Self-pay | Admitting: *Deleted

## 2024-09-25 ENCOUNTER — Telehealth: Admitting: Adult Health
